# Patient Record
Sex: Male | Born: 1935 | Race: Black or African American | Hispanic: No | Smoking: Never smoker
Health system: Southern US, Community
[De-identification: ages and names within clinical notes are randomized; demographics above are authoritative.]

## PROBLEM LIST (undated history)

## (undated) DIAGNOSIS — N1330 Unspecified hydronephrosis: Secondary | ICD-10-CM

## (undated) DIAGNOSIS — C61 Malignant neoplasm of prostate: Secondary | ICD-10-CM

## (undated) DIAGNOSIS — M1990 Unspecified osteoarthritis, unspecified site: Secondary | ICD-10-CM

## (undated) DIAGNOSIS — N289 Disorder of kidney and ureter, unspecified: Secondary | ICD-10-CM

## (undated) DIAGNOSIS — E119 Type 2 diabetes mellitus without complications: Secondary | ICD-10-CM

## (undated) DIAGNOSIS — E785 Hyperlipidemia, unspecified: Secondary | ICD-10-CM

## (undated) DIAGNOSIS — Z9189 Other specified personal risk factors, not elsewhere classified: Secondary | ICD-10-CM

## (undated) DIAGNOSIS — I10 Essential (primary) hypertension: Secondary | ICD-10-CM

## (undated) DIAGNOSIS — IMO0002 Reserved for concepts with insufficient information to code with codable children: Secondary | ICD-10-CM

## (undated) DIAGNOSIS — IMO0001 Reserved for inherently not codable concepts without codable children: Secondary | ICD-10-CM

## (undated) DIAGNOSIS — N281 Cyst of kidney, acquired: Secondary | ICD-10-CM

## (undated) HISTORY — DX: Reserved for concepts with insufficient information to code with codable children: IMO0002

## (undated) HISTORY — PX: CATARACT EXTRACTION W/ INTRAOCULAR LENS IMPLANT: SHX1309

## (undated) HISTORY — PX: TONSILECTOMY/ADENOIDECTOMY WITH MYRINGOTOMY: SHX6125

## (undated) HISTORY — DX: Reserved for inherently not codable concepts without codable children: IMO0001

## (undated) HISTORY — PX: TONSILLECTOMY: SUR1361

---

## 1998-02-16 HISTORY — PX: INGUINAL HERNIA REPAIR: SUR1180

## 1999-02-11 ENCOUNTER — Inpatient Hospital Stay (HOSPITAL_COMMUNITY): Admission: EM | Admit: 1999-02-11 | Discharge: 1999-02-14 | Payer: Self-pay | Admitting: Emergency Medicine

## 1999-02-11 ENCOUNTER — Encounter: Payer: Self-pay | Admitting: Internal Medicine

## 1999-05-01 ENCOUNTER — Encounter: Admission: RE | Admit: 1999-05-01 | Discharge: 1999-05-01 | Payer: Self-pay | Admitting: *Deleted

## 1999-05-01 ENCOUNTER — Encounter: Payer: Self-pay | Admitting: *Deleted

## 1999-05-01 ENCOUNTER — Ambulatory Visit (HOSPITAL_COMMUNITY): Admission: RE | Admit: 1999-05-01 | Discharge: 1999-05-01 | Payer: Self-pay | Admitting: *Deleted

## 1999-07-23 ENCOUNTER — Ambulatory Visit (HOSPITAL_COMMUNITY): Admission: RE | Admit: 1999-07-23 | Discharge: 1999-07-23 | Payer: Self-pay | Admitting: *Deleted

## 2000-05-28 ENCOUNTER — Ambulatory Visit (HOSPITAL_COMMUNITY): Admission: RE | Admit: 2000-05-28 | Discharge: 2000-05-28 | Payer: Self-pay | Admitting: *Deleted

## 2003-12-19 ENCOUNTER — Inpatient Hospital Stay (HOSPITAL_COMMUNITY): Admission: EM | Admit: 2003-12-19 | Discharge: 2003-12-24 | Payer: Self-pay | Admitting: Emergency Medicine

## 2004-11-13 ENCOUNTER — Ambulatory Visit (HOSPITAL_COMMUNITY): Admission: RE | Admit: 2004-11-13 | Discharge: 2004-11-13 | Payer: Self-pay | Admitting: General Surgery

## 2011-05-13 ENCOUNTER — Inpatient Hospital Stay (HOSPITAL_COMMUNITY)
Admission: EM | Admit: 2011-05-13 | Discharge: 2011-05-15 | DRG: 864 | Disposition: A | Discharge status: Home / Self Care | Payer: Medicare Other | Attending: Internal Medicine | Admitting: Internal Medicine

## 2011-05-13 DIAGNOSIS — R4182 Altered mental status, unspecified: Secondary | ICD-10-CM | POA: Diagnosis present

## 2011-05-13 DIAGNOSIS — I10 Essential (primary) hypertension: Secondary | ICD-10-CM | POA: Diagnosis present

## 2011-05-13 DIAGNOSIS — E119 Type 2 diabetes mellitus without complications: Secondary | ICD-10-CM | POA: Diagnosis present

## 2011-05-13 DIAGNOSIS — E781 Pure hyperglyceridemia: Secondary | ICD-10-CM | POA: Diagnosis present

## 2011-05-13 DIAGNOSIS — R509 Fever, unspecified: Principal | ICD-10-CM | POA: Diagnosis present

## 2011-05-13 HISTORY — DX: Essential (primary) hypertension: I10

## 2011-05-14 ENCOUNTER — Encounter (HOSPITAL_COMMUNITY): Payer: Self-pay | Admitting: *Deleted

## 2011-05-14 ENCOUNTER — Other Ambulatory Visit: Payer: Self-pay

## 2011-05-14 ENCOUNTER — Emergency Department (HOSPITAL_COMMUNITY): Payer: Medicare Other

## 2011-05-14 LAB — CBC
HCT: 47 % (ref 39.0–52.0)
HCT: 43.3 % (ref 39.0–52.0)
Hemoglobin: 15.6 g/dL (ref 13.0–17.0)
MCH: 30.4 pg (ref 26.0–34.0)
MCHC: 33.2 g/dL (ref 30.0–36.0)
MCHC: 33.9 g/dL (ref 30.0–36.0)
MCV: 89.5 fL (ref 78.0–100.0)
Platelets: 131 10*3/uL — ABNORMAL LOW (ref 150–400)
Platelets: 132 10*3/uL — ABNORMAL LOW (ref 150–400)
RBC: 5.26 MIL/uL (ref 4.22–5.81)
RDW: 13.1 % (ref 11.5–15.5)
RDW: 13.2 % (ref 11.5–15.5)
WBC: 6.1 10*3/uL (ref 4.0–10.5)

## 2011-05-14 LAB — COMPREHENSIVE METABOLIC PANEL
ALT: 9 U/L (ref 0–53)
AST: 15 U/L (ref 0–37)
Albumin: 3.9 g/dL (ref 3.5–5.2)
Alkaline Phosphatase: 58 U/L (ref 39–117)
Calcium: 9.2 mg/dL (ref 8.4–10.5)
Chloride: 98 mEq/L (ref 96–112)
GFR calc Af Amer: 64 mL/min — ABNORMAL LOW (ref >90)
GFR calc non Af Amer: 55 mL/min — ABNORMAL LOW (ref >90)
Glucose, Bld: 146 mg/dL — ABNORMAL HIGH (ref 70–99)
Potassium: 3.6 mEq/L (ref 3.5–5.1)
Sodium: 134 mEq/L — ABNORMAL LOW (ref 135–145)
Total Bilirubin: 0.5 mg/dL (ref 0.3–1.2)
Total Protein: 7.8 g/dL (ref 6.0–8.3)

## 2011-05-14 LAB — LACTIC ACID, PLASMA: Lactic Acid, Venous: 1.3 mmol/L (ref 0.5–2.2)

## 2011-05-14 LAB — POCT I-STAT, CHEM 8
BUN: 17 mg/dL (ref 6–23)
Calcium, Ion: 1.1 mmol/L — ABNORMAL LOW (ref 1.12–1.32)
Chloride: 102 mEq/L (ref 96–112)
Creatinine, Ser: 1.4 mg/dL — ABNORMAL HIGH (ref 0.50–1.35)
Glucose, Bld: 150 mg/dL — ABNORMAL HIGH (ref 70–99)
Potassium: 3.9 mEq/L (ref 3.5–5.1)
Sodium: 139 mEq/L (ref 135–145)
TCO2: 26 mmol/L (ref 0–100)

## 2011-05-14 LAB — TSH: TSH: 2.653 u[IU]/mL (ref 0.350–4.500)

## 2011-05-14 LAB — GLUCOSE, CAPILLARY
Glucose-Capillary: 125 mg/dL — ABNORMAL HIGH (ref 70–99)
Glucose-Capillary: 115 mg/dL — ABNORMAL HIGH (ref 70–99)

## 2011-05-14 LAB — URINALYSIS, ROUTINE W REFLEX MICROSCOPIC
Bilirubin Urine: NEGATIVE
Glucose, UA: NEGATIVE mg/dL
Ketones, ur: NEGATIVE mg/dL
Nitrite: NEGATIVE
Urobilinogen, UA: 0.2 mg/dL (ref 0.0–1.0)
pH: 6 (ref 5.0–8.0)

## 2011-05-14 LAB — URINE MICROSCOPIC-ADD ON

## 2011-05-14 LAB — PROCALCITONIN: Procalcitonin: <0.1 ng/mL

## 2011-05-14 LAB — POCT I-STAT TROPONIN I

## 2011-05-14 MED ORDER — ACETAMINOPHEN 325 MG PO TABS
650.0000 mg | ORAL_TABLET | Freq: Four times a day (QID) | ORAL | Status: DC | PRN
  Administered 2011-05-14: 650 mg via ORAL
  Filled 2011-05-14: qty 2

## 2011-05-14 MED ORDER — ACETAMINOPHEN 325 MG PO TABS
650.0000 mg | ORAL_TABLET | Freq: Once | ORAL | Status: AC
  Administered 2011-05-14: 650 mg via ORAL
  Filled 2011-05-14: qty 2

## 2011-05-14 MED ORDER — DEXTROSE 5 % IV SOLN
500.0000 mg | Freq: Every day | INTRAVENOUS | Status: DC
  Administered 2011-05-14: 500 mg via INTRAVENOUS
  Filled 2011-05-14 (×2): qty 500

## 2011-05-14 MED ORDER — INSULIN ASPART 100 UNIT/ML ~~LOC~~ SOLN
0.0000 [IU] | Freq: Three times a day (TID) | SUBCUTANEOUS | Status: DC
  Administered 2011-05-14 – 2011-05-15 (×2): 1 [IU] via SUBCUTANEOUS

## 2011-05-14 MED ORDER — METOPROLOL TARTRATE 12.5 MG HALF TABLET
12.5000 mg | ORAL_TABLET | Freq: Two times a day (BID) | ORAL | Status: DC
  Administered 2011-05-14 (×2): 12.5 mg via ORAL
  Filled 2011-05-14 (×5): qty 1

## 2011-05-14 MED ORDER — AZITHROMYCIN 500 MG IV SOLR
500.0000 mg | Freq: Once | INTRAVENOUS | Status: AC
  Administered 2011-05-14: 500 mg via INTRAVENOUS
  Filled 2011-05-14: qty 500

## 2011-05-14 MED ORDER — DEXTROSE 5 % IV SOLN
1.0000 g | Freq: Every day | INTRAVENOUS | Status: DC
  Administered 2011-05-14: 1 g via INTRAVENOUS
  Filled 2011-05-14 (×2): qty 10

## 2011-05-14 MED ORDER — ZOLPIDEM TARTRATE 5 MG PO TABS: 5.0000 mg | ORAL_TABLET | Freq: Every evening | ORAL | Status: DC | PRN

## 2011-05-14 MED ORDER — METFORMIN HCL 500 MG PO TABS
500.0000 mg | ORAL_TABLET | Freq: Two times a day (BID) | ORAL | Status: DC
  Administered 2011-05-14 – 2011-05-15 (×3): 500 mg via ORAL
  Filled 2011-05-14 (×5): qty 1

## 2011-05-14 MED ORDER — ASPIRIN EC 81 MG PO TBEC
81.0000 mg | DELAYED_RELEASE_TABLET | Freq: Every day | ORAL | Status: DC
  Administered 2011-05-14: 81 mg via ORAL
  Filled 2011-05-14 (×2): qty 1

## 2011-05-14 MED ORDER — ENOXAPARIN SODIUM 40 MG/0.4ML ~~LOC~~ SOLN
40.0000 mg | Freq: Every day | SUBCUTANEOUS | Status: DC
  Administered 2011-05-14: 40 mg via SUBCUTANEOUS
  Filled 2011-05-14 (×2): qty 0.4

## 2011-05-14 MED ORDER — ACETAMINOPHEN 650 MG RE SUPP: 650.0000 mg | Freq: Four times a day (QID) | RECTAL | Status: DC | PRN

## 2011-05-14 MED ORDER — ONDANSETRON HCL 4 MG PO TABS: 4.0000 mg | ORAL_TABLET | Freq: Four times a day (QID) | ORAL | Status: DC | PRN

## 2011-05-14 MED ORDER — POTASSIUM CHLORIDE IN NACL 20-0.9 MEQ/L-% IV SOLN
INTRAVENOUS | Status: DC
  Administered 2011-05-14 – 2011-05-15 (×2): via INTRAVENOUS
  Filled 2011-05-14 (×3): qty 1000

## 2011-05-14 MED ORDER — SENNOSIDES-DOCUSATE SODIUM 8.6-50 MG PO TABS: 1.0000 | ORAL_TABLET | Freq: Every evening | ORAL | Status: DC | PRN

## 2011-05-14 MED ORDER — DEXTROSE 5 % IV SOLN
1.0000 g | Freq: Once | INTRAVENOUS | Status: AC
  Administered 2011-05-14: 1 g via INTRAVENOUS
  Filled 2011-05-14: qty 10

## 2011-05-14 MED ORDER — ONDANSETRON HCL 4 MG/2ML IJ SOLN: 4.0000 mg | Freq: Four times a day (QID) | INTRAMUSCULAR | Status: DC | PRN

## 2011-05-14 MED ORDER — SODIUM CHLORIDE 0.9 % IV BOLUS (SEPSIS)
1000.0000 mL | Freq: Once | INTRAVENOUS | Status: AC
  Administered 2011-05-14: 1000 mL via INTRAVENOUS

## 2011-05-14 MED ORDER — SODIUM CHLORIDE 0.9 % IV SOLN
INTRAVENOUS | Status: DC
  Administered 2011-05-14: 01:00:00 via INTRAVENOUS

## 2011-05-14 MED ORDER — ALUM & MAG HYDROXIDE-SIMETH 200-200-20 MG/5ML PO SUSP: 30.0000 mL | Freq: Four times a day (QID) | ORAL | Status: DC | PRN

## 2011-05-14 MED ORDER — GLIPIZIDE 5 MG PO TABS
5.0000 mg | ORAL_TABLET | Freq: Two times a day (BID) | ORAL | Status: DC
  Administered 2011-05-14 – 2011-05-15 (×3): 5 mg via ORAL
  Filled 2011-05-14 (×5): qty 1

## 2011-05-14 NOTE — ED Notes (Addendum)
Here with son, son reports father alterred for 2d, fever present in triage, son report: pt has had, alterred MS, confusion, sob, unsteady on feet, cough, leg swelling "more than usual". Pt alert/lethargic & interactive. Daughter also present, reports: "he denies and minimizes sx, has been delusional and confused".

## 2011-05-14 NOTE — ED Notes (Signed)
First meeting patient. Patient resting with NAD at this time. Family at bedside.

## 2011-05-14 NOTE — H&P (Signed)
Tony Vasquez is an 78 y.o. male.   Chief Complaint: AMS HPI: Patient is a 78 year old AA male with a benign medical history of DM2 well controlled and HTN.  Last seen for yearly APE in Jan 2013.  Brought in to the ER with changes in AMS/confusion and a temp.  Notes a cough but otherwise has no other complaints.  ER w/u suggests possible early PNA, I was asked to admit.  He is oriented to person and place, but not fully to circumstance although he does recognize me.  Doing a bit better as his fever is breaking according to his daughter.  Past Medical History  Diagnosis Date  . Diabetes mellitus   . Hypertension     Past Surgical History  Procedure Date  . Hernia repair     Review of Systems  General:       Denies fevers, chills, sweats.   Eyes:       Denies blurring, diplopia.   Ears/Nose/Throat:       Denies earache, ear discharge, nosebleeds, sore throat.   Cardiovascular:       Denies chest pains, palpitations.   Respiratory:       Denies cough, dyspnea.   Gastrointestinal:       Denies nausea, vomiting, diarrhea, constipation.   Genitourinary:       Denies dysuria, urinary hesitancy.   Musculoskeletal:       Denies back pain, joint pain.   Skin:       Denies rash, itching.   Neurologic:       Denies weakness.   Psychiatric:       Denies depression, anxiety. Slightly confused Endocrine:       Denies cold intolerance, heat intolerance, polydipsia, polyphagia, polyuria, weight change.   Heme/Lymphatic:       Denies abnormal bruising, bleeding.   Allergic/Immunologic:       Denies urticaria, hay fever.    Past History Past Medical History (reviewed - no changes required): Diabetes Mellitus Type 2, 1998 HTN Hypertriglyceridemia Surgical History (reviewed - no changes required): L hernia inguinal L Cataracts Teeth pulled (complete) 2012 Family History (reviewed - no changes required): Father d 74 Pneumonia, DM2, Obesity Mother d 10's 3 sistersA, 1d 1  brother  Social History (reviewed - no changes required): Widowed 2011 to [place], 4 children HS education Retired Presenter, broadcasting   Physical Exam  General appearance: well nourished, well hydrated, no acute distress  Eyes  Ophthalmoscopic: discs sharp and flat, no a/v nicking, hemorrhages, or exudates  Ears, Nose and Throat  External ears: normal, no lesions or deformities External nose: normal, no lesions or deformities Otoscopic: canals clear, tympanic membranes intact, no fluid Hearing: grossly intact Nasal: mucosa, septum, and turbinates normal Dental: good dentition Pharynx: tongue normal, protrudes mid line,  posterior pharynx without erythema or exudate  Neck  Neck: supple, no masses, trachea midline Thyroid: no nodules, masses, tenderness, or enlargement  Respiratory  Respiratory effort: no intercostal retractions or use of accessory muscles Auscultation: no rales, rhonchi, or wheezes  Cardiovascular  Palpation: no thrill or palpable murmurs, no displacement of PMI Auscultation: S1, S2, no murmur, rub, or gallop Periph. circulation: no cyanosis, clubbing, edema  Gastrointestinal  Abdomen: soft, non-tender, no masses, bowel sounds normal Liver and spleen: no enlargement or nodularity  Genitourinary  Prostate: no enlargement or nodularity  Lymphatic  Neck: no cervical adenopathy Misc. lymph nodes: no other adenopathy  Musculoskeletal  Gait and station: normal Digits and nails: no  clubbing, cyanosis, petechiae, or nodes Head and neck: normal alignment and mobility Spine, ribs, pelvis: normal alignment and mobility, no deformity RUE: normal ROM and strength, no joint enlargement or tenderness LUE: normal ROM and strength, no joint enlargement or tenderness RLE: normal ROM and strength, no joint enlargement or tenderness LLE: normal ROM and strength, no joint enlargement or tenderness Right Knee: Normal Knee Exam with out Effusion Left Knee: Normal Knee Exam with  out Effusion  Skin  Inspection: no rashes, lesions  Neurologic  Cranial nerves: II - XII grossly intact Reflexes: 2+, symmetric Sensation: intact to touch, vibration Speech:  Normal  Mental Status Exam  Judgment, insight: intact Orientation: oriented to time, place, and person, a bit confused on circumstance Memory: intact Mood and affect: Normal Mood Mentation: Alert & Oriented Hallucinations/Delusion: No evidence   Medications Prior to Admission  Medication Dose Route Frequency Provider Last Rate Last Dose  . 0.9 %  sodium chloride infusion   Intravenous Continuous Sunnie Nielsen, MD 125 mL/hr at 05/14/11 0123    . acetaminophen (TYLENOL) tablet 650 mg  650 mg Oral Once Sunnie Nielsen, MD   650 mg at 05/14/11 0029  . azithromycin (ZITHROMAX) 500 mg in dextrose 5 % 250 mL IVPB  500 mg Intravenous Once Sunnie Nielsen, MD   500 mg at 05/14/11 0151  . cefTRIAXone (ROCEPHIN) 1 g in dextrose 5 % 50 mL IVPB  1 g Intravenous Once Sunnie Nielsen, MD   1 g at 05/14/11 0124  . sodium chloride 0.9 % bolus 1,000 mL  1,000 mL Intravenous Once Sunnie Nielsen, MD   1,000 mL at 05/14/11 0029   No current outpatient prescriptions on file as of 05/13/2011.    Results for orders placed during the hospital encounter of 05/13/11 (from the past 48 hour(s))  CBC     Status: Abnormal   Collection Time   05/14/11 12:19 AM      Component Value Range Comment   WBC 6.1  4.0 - 10.5 (K/uL)    RBC 5.26  4.22 - 5.81 (MIL/uL)    Hemoglobin 15.6  13.0 - 17.0 (g/dL)    HCT 16.1  09.6 - 04.5 (%)    MCV 89.4  78.0 - 100.0 (fL)    MCH 29.7  26.0 - 34.0 (pg)    MCHC 33.2  30.0 - 36.0 (g/dL)    RDW 40.9  81.1 - 91.4 (%)    Platelets 131 (*) 150 - 400 (K/uL)   COMPREHENSIVE METABOLIC PANEL     Status: Abnormal   Collection Time   05/14/11 12:19 AM      Component Value Range Comment   Sodium 134 (*) 135 - 145 (mEq/L)    Potassium 3.6  3.5 - 5.1 (mEq/L)    Chloride 98  96 - 112 (mEq/L)    CO2 25  19 - 32 (mEq/L)     Glucose, Bld 146 (*) 70 - 99 (mg/dL)    BUN 14  6 - 23 (mg/dL)    Creatinine, Ser 7.82  0.50 - 1.35 (mg/dL)    Calcium 9.2  8.4 - 10.5 (mg/dL)    Total Protein 7.8  6.0 - 8.3 (g/dL)    Albumin 3.9  3.5 - 5.2 (g/dL)    AST 15  0 - 37 (U/L)    ALT 9  0 - 53 (U/L)    Alkaline Phosphatase 58  39 - 117 (U/L)    Total Bilirubin 0.5  0.3 - 1.2 (mg/dL)  GFR calc non Af Amer 55 (*) >90 (mL/min)    GFR calc Af Amer 64 (*) >90 (mL/min)   PROCALCITONIN     Status: Normal   Collection Time   05/14/11 12:19 AM      Component Value Range Comment   Procalcitonin <0.10     LACTIC ACID, PLASMA     Status: Normal   Collection Time   05/14/11 12:41 AM      Component Value Range Comment   Lactic Acid, Venous 1.3  0.5 - 2.2 (mmol/L)   POCT I-STAT, CHEM 8     Status: Abnormal   Collection Time   05/14/11 12:43 AM      Component Value Range Comment   Sodium 139  135 - 145 (mEq/L)    Potassium 3.9  3.5 - 5.1 (mEq/L)    Chloride 102  96 - 112 (mEq/L)    BUN 17  6 - 23 (mg/dL)    Creatinine, Ser 1.19 (*) 0.50 - 1.35 (mg/dL)    Glucose, Bld 147 (*) 70 - 99 (mg/dL)    Calcium, Ion 8.29 (*) 1.12 - 1.32 (mmol/L)    TCO2 26  0 - 100 (mmol/L)    Hemoglobin 17.0  13.0 - 17.0 (g/dL)    HCT 56.2  13.0 - 86.5 (%)   URINALYSIS, ROUTINE W REFLEX MICROSCOPIC     Status: Abnormal   Collection Time   05/14/11 12:53 AM      Component Value Range Comment   Color, Urine YELLOW  YELLOW     APPearance CLEAR  CLEAR     Specific Gravity, Urine 1.020  1.005 - 1.030     pH 6.0  5.0 - 8.0     Glucose, UA NEGATIVE  NEGATIVE (mg/dL)    Hgb urine dipstick TRACE (*) NEGATIVE     Bilirubin Urine NEGATIVE  NEGATIVE     Ketones, ur NEGATIVE  NEGATIVE (mg/dL)    Protein, ur NEGATIVE  NEGATIVE (mg/dL)    Urobilinogen, UA 0.2  0.0 - 1.0 (mg/dL)    Nitrite NEGATIVE  NEGATIVE     Leukocytes, UA NEGATIVE  NEGATIVE    URINE MICROSCOPIC-ADD ON     Status: Normal   Collection Time   05/14/11 12:53 AM      Component Value Range  Comment   Squamous Epithelial / LPF RARE  RARE     WBC, UA 0-2  <3 (WBC/hpf)    RBC / HPF 3-6  <3 (RBC/hpf)    Bacteria, UA RARE  RARE    POCT I-STAT TROPONIN I     Status: Normal   Collection Time   05/14/11 12:56 AM      Component Value Range Comment   Troponin i, poc 0.00  0.00 - 0.08 (ng/mL)    Comment 3             Ct Head Wo Contrast  05/14/2011  *RADIOLOGY REPORT*  Clinical Data: Altered mental status.  Headache.  CT HEAD WITHOUT CONTRAST  Technique:  Contiguous axial images were obtained from the base of the skull through the vertex without contrast.  Comparison: None.  Findings: There is no evidence of acute infarction, mass lesion, or intra- or extra-axial hemorrhage on CT.  Prominence of the ventricles and sulci reflects mild cortical volume loss.  Diffuse periventricular and subcortical white matter change likely reflects small vessel ischemic microangiopathy.  Mild cerebellar atrophy is noted.  The brainstem and fourth ventricle are within normal limits.  The  basal ganglia are unremarkable in appearance.  The cerebral hemispheres demonstrate grossly normal gray-white differentiation. No mass effect or midline shift is seen.  There is no evidence of fracture; visualized osseous structures are unremarkable in appearance.  The visualized portions of the orbits are within normal limits.  The paranasal sinuses and mastoid air cells are well-aerated.  No significant soft tissue abnormalities are seen.  IMPRESSION:  1.  No acute intracranial pathology seen on CT. 2.  Mild cortical volume loss and diffuse small vessel ischemic microangiopathy.  Original Report Authenticated By: Tonia Ghent, M.D.   Dg Chest Portable 1 View  05/14/2011  *RADIOLOGY REPORT*  Clinical Data: Shortness of breath and fever.  PORTABLE CHEST - 1 VIEW  Comparison: 12/23/2003  Findings: The cardiomediastinal silhouette is unremarkable. There is no evidence of focal airspace disease, pulmonary edema, suspicious pulmonary  nodule/mass, pleural effusion, or pneumothorax. No acute bony abnormalities are identified.  IMPRESSION: No evidence of active cardiopulmonary disease.  Original Report Authenticated By: Rosendo Gros, M.D.    ROS  Blood pressure 155/78, pulse 100, temperature 100 F (37.8 C), temperature source Oral, resp. rate 16, SpO2 99.00%. Physical Exam   Assessment/Plan AMS  Likely related to PNA, Rocephin/Azithromycin in ER as has fever.  WIll repeat CXR tomorrow AM.  Other labs an workup negative.  BC x 2 ordered. TSH and B12 levels ordered although with fever, likely related to illness DM2  Well controlled HTN  Continue Metoprolol  Aleem Elza W 05/14/2011, 2:31 AM

## 2011-05-14 NOTE — ED Provider Notes (Addendum)
History     CSN: [id number]  Arrival date & time 05/13/11  2244   First MD Initiated Contact with Patient 05/14/11 0006      Chief Complaint  Patient presents with  . Shortness of Breath  . Fever  . Altered Mental Status    (Consider location/radiation/quality/duration/timing/severity/associated sxs/prior treatment) Patient is a 78 y.o. male presenting with shortness of breath, fever, and altered mental status. The history is provided by the patient and a relative.  Shortness of Breath  The current episode started yesterday. The onset was gradual. The problem occurs continuously. The problem has been gradually worsening. The problem is moderate. The symptoms are relieved by nothing. The symptoms are aggravated by nothing. Associated symptoms include a fever and shortness of breath. Pertinent negatives include no chest pain. There were no sick contacts.  Fever Primary symptoms of the febrile illness include fever, shortness of breath and altered mental status. Primary symptoms do not include headaches, abdominal pain, dysuria or rash.  Altered Mental Status Associated symptoms include shortness of breath. Pertinent negatives include no chest pain, no abdominal pain and no headaches.   family noticed confusion and not acting himself tonight. Patient denies any significant complaints. No chest pain. No abdominal pain. No nausea vomiting or diarrhea. No rashes. no recent travel. Patient able to state his name and tell me where he is.   Past Medical History  Diagnosis Date  . Diabetes mellitus   . Hypertension     Past Surgical History  Procedure Date  . Hernia repair     No family history on file.  History  Substance Use Topics  . Smoking status: Never Smoker   . Smokeless tobacco: Not on file  . Alcohol Use: No      Review of Systems  Constitutional: Positive for fever. Negative for chills.  HENT: Negative for neck pain and neck stiffness.   Eyes: Negative for pain.    Respiratory: Positive for shortness of breath.   Cardiovascular: Negative for chest pain.  Gastrointestinal: Negative for abdominal pain.  Genitourinary: Negative for dysuria.  Musculoskeletal: Negative for back pain.  Skin: Negative for rash.  Neurological: Negative for dizziness and headaches.  Psychiatric/Behavioral: Positive for confusion and altered mental status.  All other systems reviewed and are negative.    Allergies  Review of patient's allergies indicates no known allergies.  Home Medications   Current Outpatient Rx  Name Route Sig Dispense Refill  . GLIPIZIDE 5 MG PO TABS Oral Take 5 mg by mouth 2 (two) times daily before a meal.    . METFORMIN HCL 500 MG PO TABS Oral Take 500 mg by mouth 2 (two) times daily with a meal.      BP 155/78  Pulse 100  Temp(Src) 100 F (37.8 C) (Oral)  Resp 16  SpO2 99%  Physical Exam  Constitutional: He appears well-developed and well-nourished.  HENT:  Head: Normocephalic and atraumatic.       Dry mucous membranes  Eyes: EOM are normal. Pupils are equal, round, and reactive to light.  Neck: Trachea normal. Neck supple. No thyromegaly present.  Cardiovascular: Normal rate, regular rhythm, S1 normal, S2 normal and normal pulses.     No systolic murmur is present   No diastolic murmur is present  Pulses:      Radial pulses are 2+ on the right side, and 2+ on the left side.  Pulmonary/Chest: Effort normal and breath sounds normal. He has no wheezes. He has no rhonchi. He  has no rales. He exhibits no tenderness.  Abdominal: Soft. Normal appearance and bowel sounds are normal. There is no tenderness. There is no CVA tenderness and negative Murphy's sign.  Musculoskeletal:       BLE:s Calves nontender, no cords or erythema, negative Homans sign  Neurological: He is alert. He has normal strength. No cranial nerve deficit or sensory deficit. GCS eye subscore is 4. GCS verbal subscore is 5. GCS motor subscore is 6.       Alert and  oriented x2. No unilateral deficits  Skin: Skin is warm and dry. No rash noted. He is not diaphoretic.  Psychiatric: His speech is normal.       Cooperative and appropriate    ED Course  Procedures (including critical care time)  Labs Reviewed  CBC - Abnormal; Notable for the following:    Platelets 131 (*)    All other components within normal limits  COMPREHENSIVE METABOLIC PANEL - Abnormal; Notable for the following:    Sodium 134 (*)    Glucose, Bld 146 (*)    GFR calc non Af Amer 55 (*)    GFR calc Af Amer 64 (*)    All other components within normal limits  URINALYSIS, ROUTINE W REFLEX MICROSCOPIC - Abnormal; Notable for the following:    Hgb urine dipstick TRACE (*)    All other components within normal limits  POCT I-STAT, CHEM 8 - Abnormal; Notable for the following:    Creatinine, Ser 1.40 (*)    Glucose, Bld 150 (*)    Calcium, Ion 1.10 (*)    All other components within normal limits  LACTIC ACID, PLASMA  PROCALCITONIN  POCT I-STAT TROPONIN I  URINE MICROSCOPIC-ADD ON   Dg Chest Portable 1 View  05/14/2011  *RADIOLOGY REPORT*  Clinical Data: Shortness of breath and fever.  PORTABLE CHEST - 1 VIEW  Comparison: 12/23/2003  Findings: The cardiomediastinal silhouette is unremarkable. There is no evidence of focal airspace disease, pulmonary edema, suspicious pulmonary nodule/mass, pleural effusion, or pneumothorax. No acute bony abnormalities are identified.  IMPRESSION: No evidence of active cardiopulmonary disease.  Original Report Authenticated By: Rosendo Gros, M.D.    Date: 05/14/2011  Rate: 100  Rhythm: normal sinus rhythm  QRS Axis: normal  Intervals: normal  ST/T Wave abnormalities: nonspecific ST/T changes  Conduction Disutrbances:none  Narrative Interpretation:   Old EKG Reviewed: unchanged     2:13 AM case d/w Dr Wylene Simmer as above. He will admit and see PT in am.    MDM   Fever, altered mental status with some respiratory complaints respiratory  complaints, otherwise no infectious symptoms. Tylenol and IV fluids provided. Blood pressure in normal range and no clinical sepsis. Family bedside very concerned with confusion and altered mental status. IV antibiotics initiated for possible early pneumonia. No risk factors for healthcare associated pneumonia. Plan admit as above.        Sunnie Nielsen, MD 05/14/11 6962  Sunnie Nielsen, MD 05/14/11 [phone]

## 2011-05-14 NOTE — ED Notes (Signed)
Report given to Duwayne Heck, RN on 3000. Patient placed on zoll and being transported to 3013.

## 2011-05-15 ENCOUNTER — Inpatient Hospital Stay (HOSPITAL_COMMUNITY): Payer: Medicare Other

## 2011-05-15 LAB — BASIC METABOLIC PANEL
CO2: 26 mEq/L (ref 19–32)
Calcium: 8.5 mg/dL (ref 8.4–10.5)
GFR calc non Af Amer: 56 mL/min — ABNORMAL LOW (ref >90)
Glucose, Bld: 69 mg/dL — ABNORMAL LOW (ref 70–99)
Potassium: 3.7 mEq/L (ref 3.5–5.1)
Sodium: 136 mEq/L (ref 135–145)

## 2011-05-15 LAB — GLUCOSE, CAPILLARY: Glucose-Capillary: 126 mg/dL — ABNORMAL HIGH (ref 70–99)

## 2011-05-15 LAB — CBC
Hemoglobin: 15 g/dL (ref 13.0–17.0)
MCH: 30.2 pg (ref 26.0–34.0)
MCHC: 34.6 g/dL (ref 30.0–36.0)
Platelets: 135 10*3/uL — ABNORMAL LOW (ref 150–400)
RBC: 4.97 MIL/uL (ref 4.22–5.81)

## 2011-05-15 MED ORDER — METOPROLOL TARTRATE 12.5 MG HALF TABLET: 12.5000 mg | ORAL_TABLET | Freq: Two times a day (BID) | ORAL | Status: DC

## 2011-05-15 MED ORDER — CEFUROXIME AXETIL 500 MG PO TABS: 500.0000 mg | ORAL_TABLET | Freq: Two times a day (BID) | ORAL | Status: AC

## 2011-05-15 NOTE — Progress Notes (Signed)
Patient discharge instructions given to patient and family. NO further questions asked. Pt D/C home with no signs of acute distress.

## 2011-05-15 NOTE — Discharge Summary (Signed)
Physician Discharge Summary  Patient ID: BURWELL BETHEL MRN: [id number] DOB/AGE: August 07, 1932 78 y.o.  Admit date: 05/13/2011 Discharge date: 05/15/2011  Admission Diagnoses:  Discharge Diagnoses:  Active Problems:  * No active hospital problems. *    Discharged Condition: stable  Hospital Course: Mr. Leffel was admitted with fever and AMS.  Had negative CXR, BCx2 and U/A   Once fever resolved, back to normal mental status.  Daughter at bedside.  No fevers since day of admission.  Unremarkable course.  Ready to go home today.  Consults: None  Significant Diagnostic Studies: labs: Normal CBC, CXR, U/A, BC x 2 negative Treatments: IV hydration and antibiotics: ceftriaxone and azithromycin  Discharge Exam: Blood pressure 176/88, pulse 77, temperature 99.2 F (37.3 C), temperature source Oral, resp. rate 18, height 5\' 5"  (1.651 m), weight 73.7 kg (162 lb 7.7 oz), SpO2 98.00%. General appearance: alert, cooperative and appears stated age Throat: lips, mucosa, and tongue normal; teeth and gums normal Resp: clear to auscultation bilaterally Cardio: regular rate and rhythm, S1, S2 normal, no murmur, click, rub or gallop GI: soft, non-tender; bowel sounds normal; no masses,  no organomegaly Extremities: extremities normal, atraumatic, no cyanosis or edema Lymph nodes: Cervical, supraclavicular, and axillary nodes normal. Neurologic: Alert and oriented X 3, normal strength and tone. Normal symmetric reflexes. Normal coordination and gait  Disposition:    Medication List  As of 05/15/2011  9:02 AM   TAKE these medications         cefUROXime 500 MG tablet   Commonly known as: CEFTIN   Take 1 tablet (500 mg total) by mouth 2 (two) times daily.      glipiZIDE 5 MG tablet   Commonly known as: GLUCOTROL   Take 5 mg by mouth 2 (two) times daily before a meal.      metFORMIN 500 MG tablet   Commonly known as: GLUCOPHAGE   Take 500 mg by mouth 2 (two) times daily with a meal.     metoprolol tartrate 12.5 mg Tabs   Commonly known as: LOPRESSOR   Take 0.5 tablets (12.5 mg total) by mouth 2 (two) times daily.           Follow-up Information    Follow up with Gaspar Garbe, MD. Fransisco Beau will call to check on him on Monday)          Signed: Alaila Pillard W 05/15/2011, 9:02 AM

## 2011-05-15 NOTE — Progress Notes (Signed)
05-15-11 UR completed. Ronny Flurry RN BSN

## 2011-05-20 LAB — CULTURE, BLOOD (ROUTINE X 2)
Culture  Setup Time: 201303281033
Culture  Setup Time: 201303281033
Culture: NO GROWTH

## 2012-10-24 ENCOUNTER — Emergency Department (HOSPITAL_COMMUNITY): Payer: Medicare Other

## 2012-10-24 ENCOUNTER — Inpatient Hospital Stay (HOSPITAL_COMMUNITY)
Admission: EM | Admit: 2012-10-24 | Discharge: 2012-10-26 | DRG: 203 | Disposition: A | Discharge status: Home Health | Payer: Medicare Other | Attending: Internal Medicine | Admitting: Internal Medicine

## 2012-10-24 ENCOUNTER — Encounter (HOSPITAL_COMMUNITY): Payer: Self-pay | Admitting: Emergency Medicine

## 2012-10-24 ENCOUNTER — Inpatient Hospital Stay (HOSPITAL_COMMUNITY): Payer: Medicare Other

## 2012-10-24 DIAGNOSIS — J40 Bronchitis, not specified as acute or chronic: Principal | ICD-10-CM | POA: Diagnosis present

## 2012-10-24 DIAGNOSIS — E860 Dehydration: Secondary | ICD-10-CM | POA: Diagnosis present

## 2012-10-24 DIAGNOSIS — Z79899 Other long term (current) drug therapy: Secondary | ICD-10-CM

## 2012-10-24 DIAGNOSIS — M1990 Unspecified osteoarthritis, unspecified site: Secondary | ICD-10-CM | POA: Diagnosis present

## 2012-10-24 DIAGNOSIS — D696 Thrombocytopenia, unspecified: Secondary | ICD-10-CM

## 2012-10-24 DIAGNOSIS — R739 Hyperglycemia, unspecified: Secondary | ICD-10-CM

## 2012-10-24 DIAGNOSIS — N179 Acute kidney failure, unspecified: Secondary | ICD-10-CM

## 2012-10-24 DIAGNOSIS — N289 Disorder of kidney and ureter, unspecified: Secondary | ICD-10-CM | POA: Diagnosis present

## 2012-10-24 DIAGNOSIS — E119 Type 2 diabetes mellitus without complications: Secondary | ICD-10-CM

## 2012-10-24 DIAGNOSIS — I10 Essential (primary) hypertension: Secondary | ICD-10-CM | POA: Diagnosis present

## 2012-10-24 DIAGNOSIS — R259 Unspecified abnormal involuntary movements: Secondary | ICD-10-CM | POA: Diagnosis present

## 2012-10-24 DIAGNOSIS — A419 Sepsis, unspecified organism: Secondary | ICD-10-CM

## 2012-10-24 LAB — CBC WITH DIFFERENTIAL/PLATELET
Eosinophils Relative: 1 % (ref 0–5)
Lymphocytes Relative: 9 % — ABNORMAL LOW (ref 12–46)
Lymphs Abs: 0.7 10*3/uL (ref 0.7–4.0)
MCV: 89.7 fL (ref 78.0–100.0)
Neutro Abs: 6.6 10*3/uL (ref 1.7–7.7)
Platelets: 144 10*3/uL — ABNORMAL LOW (ref 150–400)
RBC: 4.94 MIL/uL (ref 4.22–5.81)
WBC: 7.9 10*3/uL (ref 4.0–10.5)

## 2012-10-24 LAB — POCT I-STAT TROPONIN I: Troponin i, poc: 0.02 ng/mL (ref 0.00–0.08)

## 2012-10-24 LAB — URINALYSIS, ROUTINE W REFLEX MICROSCOPIC
Glucose, UA: NEGATIVE mg/dL
Leukocytes, UA: NEGATIVE
Nitrite: NEGATIVE
Protein, ur: NEGATIVE mg/dL
Urobilinogen, UA: 0.2 mg/dL (ref 0.0–1.0)

## 2012-10-24 LAB — LACTIC ACID, PLASMA: Lactic Acid, Venous: 2.3 mmol/L — ABNORMAL HIGH (ref 0.5–2.2)

## 2012-10-24 LAB — COMPREHENSIVE METABOLIC PANEL
Albumin: 4.1 g/dL (ref 3.5–5.2)
BUN: 19 mg/dL (ref 6–23)
Chloride: 97 mEq/L (ref 96–112)
Creatinine, Ser: 1.61 mg/dL — ABNORMAL HIGH (ref 0.50–1.35)
GFR calc Af Amer: 46 mL/min — ABNORMAL LOW (ref >90)
Total Bilirubin: 0.8 mg/dL (ref 0.3–1.2)
Total Protein: 7.9 g/dL (ref 6.0–8.3)

## 2012-10-24 LAB — CREATININE, SERUM
Creatinine, Ser: 1.58 mg/dL — ABNORMAL HIGH (ref 0.50–1.35)
GFR calc non Af Amer: 41 mL/min — ABNORMAL LOW (ref >90)

## 2012-10-24 LAB — CBC
Hemoglobin: 15.2 g/dL (ref 13.0–17.0)
MCH: 31.6 pg (ref 26.0–34.0)
MCHC: 35.4 g/dL (ref 30.0–36.0)
Platelets: 130 10*3/uL — ABNORMAL LOW (ref 150–400)
RBC: 4.81 MIL/uL (ref 4.22–5.81)

## 2012-10-24 LAB — GLUCOSE, CAPILLARY
Glucose-Capillary: 93 mg/dL (ref 70–99)
Glucose-Capillary: 168 mg/dL — ABNORMAL HIGH (ref 70–99)

## 2012-10-24 LAB — PROTIME-INR
INR: 1.03 (ref 0.00–1.49)
Prothrombin Time: 13.3 seconds (ref 11.6–15.2)

## 2012-10-24 MED ORDER — INSULIN DETEMIR 100 UNIT/ML ~~LOC~~ SOLN
4.0000 [IU] | Freq: Every day | SUBCUTANEOUS | Status: DC
  Filled 2012-10-24: qty 0.04

## 2012-10-24 MED ORDER — ENOXAPARIN SODIUM 40 MG/0.4ML ~~LOC~~ SOLN
40.0000 mg | SUBCUTANEOUS | Status: DC
  Administered 2012-10-24 – 2012-10-26 (×3): 40 mg via SUBCUTANEOUS
  Filled 2012-10-24 (×3): qty 0.4

## 2012-10-24 MED ORDER — SODIUM CHLORIDE 0.9 % IJ SOLN
3.0000 mL | Freq: Two times a day (BID) | INTRAMUSCULAR | Status: DC
  Administered 2012-10-24 – 2012-10-25 (×2): 3 mL via INTRAVENOUS

## 2012-10-24 MED ORDER — ONDANSETRON HCL 4 MG/2ML IJ SOLN: 4.0000 mg | Freq: Four times a day (QID) | INTRAMUSCULAR | Status: DC | PRN

## 2012-10-24 MED ORDER — DOCUSATE SODIUM 100 MG PO CAPS
100.0000 mg | ORAL_CAPSULE | Freq: Two times a day (BID) | ORAL | Status: DC
  Administered 2012-10-24 – 2012-10-26 (×5): 100 mg via ORAL
  Filled 2012-10-24 (×6): qty 1

## 2012-10-24 MED ORDER — DEXTROSE 5 % IV SOLN
2.0000 g | Freq: Once | INTRAVENOUS | Status: AC
  Administered 2012-10-24: 2 g via INTRAVENOUS
  Filled 2012-10-24: qty 2

## 2012-10-24 MED ORDER — DEXTROSE 5 % IV SOLN
500.0000 mg | Freq: Once | INTRAVENOUS | Status: AC
  Administered 2012-10-24: 500 mg via INTRAVENOUS
  Filled 2012-10-24: qty 500

## 2012-10-24 MED ORDER — POLYETHYLENE GLYCOL 3350 17 G PO PACK
17.0000 g | PACK | Freq: Every day | ORAL | Status: DC | PRN
  Filled 2012-10-24: qty 1

## 2012-10-24 MED ORDER — ONDANSETRON HCL 4 MG PO TABS: 4.0000 mg | ORAL_TABLET | Freq: Four times a day (QID) | ORAL | Status: DC | PRN

## 2012-10-24 MED ORDER — SODIUM CHLORIDE 0.9 % IV BOLUS (SEPSIS)
1000.0000 mL | Freq: Once | INTRAVENOUS | Status: AC
  Administered 2012-10-24: 1000 mL via INTRAVENOUS

## 2012-10-24 MED ORDER — CHLORPROMAZINE HCL 25 MG/ML IJ SOLN
25.0000 mg | Freq: Three times a day (TID) | INTRAMUSCULAR | Status: DC | PRN
  Filled 2012-10-24: qty 1

## 2012-10-24 MED ORDER — ACETAMINOPHEN 325 MG PO TABS
650.0000 mg | ORAL_TABLET | Freq: Once | ORAL | Status: AC
  Administered 2012-10-24: 650 mg via ORAL
  Filled 2012-10-24: qty 2

## 2012-10-24 MED ORDER — ACETAMINOPHEN 325 MG PO TABS: 650.0000 mg | ORAL_TABLET | Freq: Four times a day (QID) | ORAL | Status: DC | PRN

## 2012-10-24 MED ORDER — VANCOMYCIN HCL 10 G IV SOLR
1500.0000 mg | Freq: Once | INTRAVENOUS | Status: AC
  Administered 2012-10-24: 1500 mg via INTRAVENOUS
  Filled 2012-10-24: qty 1500

## 2012-10-24 MED ORDER — CHLORPROMAZINE HCL 10 MG PO TABS
10.0000 mg | ORAL_TABLET | Freq: Three times a day (TID) | ORAL | Status: DC | PRN
  Administered 2012-10-24 – 2012-10-25 (×2): 10 mg via ORAL
  Filled 2012-10-24 (×2): qty 1

## 2012-10-24 MED ORDER — GLUCERNA SHAKE PO LIQD
237.0000 mL | ORAL | Status: DC
  Administered 2012-10-24 – 2012-10-26 (×3): 237 mL via ORAL

## 2012-10-24 MED ORDER — ACETAMINOPHEN 650 MG RE SUPP: 650.0000 mg | Freq: Four times a day (QID) | RECTAL | Status: DC | PRN

## 2012-10-24 MED ORDER — ZOLPIDEM TARTRATE 5 MG PO TABS: 5.0000 mg | ORAL_TABLET | Freq: Every evening | ORAL | Status: DC | PRN

## 2012-10-24 MED ORDER — INSULIN ASPART 100 UNIT/ML ~~LOC~~ SOLN
0.0000 [IU] | Freq: Three times a day (TID) | SUBCUTANEOUS | Status: DC
  Administered 2012-10-24: 2 [IU] via SUBCUTANEOUS
  Administered 2012-10-24: 1 [IU] via SUBCUTANEOUS
  Administered 2012-10-25 (×2): 2 [IU] via SUBCUTANEOUS
  Administered 2012-10-25: 1 [IU] via SUBCUTANEOUS
  Administered 2012-10-26: 2 [IU] via SUBCUTANEOUS
  Administered 2012-10-26: 1 [IU] via SUBCUTANEOUS

## 2012-10-24 MED ORDER — POTASSIUM CHLORIDE IN NACL 20-0.9 MEQ/L-% IV SOLN
INTRAVENOUS | Status: DC
  Administered 2012-10-24 – 2012-10-26 (×4): via INTRAVENOUS
  Filled 2012-10-24 (×6): qty 1000

## 2012-10-24 MED ORDER — VANCOMYCIN HCL IN DEXTROSE 750-5 MG/150ML-% IV SOLN
750.0000 mg | INTRAVENOUS | Status: DC
  Administered 2012-10-25: 750 mg via INTRAVENOUS
  Filled 2012-10-24: qty 150

## 2012-10-24 MED ORDER — CEFTRIAXONE SODIUM 1 G IJ SOLR
1.0000 g | INTRAMUSCULAR | Status: DC
  Administered 2012-10-25 – 2012-10-26 (×2): 1 g via INTRAVENOUS
  Filled 2012-10-24 (×3): qty 10

## 2012-10-24 NOTE — Progress Notes (Signed)
Clinical Social Work Department BRIEF PSYCHOSOCIAL ASSESSMENT 10/24/2012  Patient:  Tony Vasquez,Tony Vasquez     Account Number:  [id number]     Admit date:  10/24/2012  Clinical Social Worker:  Harless Nakayama  Date/Time:  10/24/2012 04:00 PM  Referred by:  Physician  Date Referred:  10/24/2012 Referred for  SNF Placement   Other Referral:   Interview type:  Family Other interview type:   CSW spoke with pt daughter while pt present in pt room    PSYCHOSOCIAL DATA Living Status:  WITH ADULT CHILDREN Admitted from facility:   Level of care:   Primary support name:  [name] [phone] Primary support relationship to patient:  CHILD, ADULT Degree of support available:   Pt has good family support from children    CURRENT CONCERNS Current Concerns  Post-Acute Placement   Other Concerns:    SOCIAL WORK ASSESSMENT / PLAN CSW became aware that PT is recommending CIR. CSW spoke with family about back up as SNF. Pt daughter unsure if this will be the desired back up plan and asked CSW about other options. CSW explained the difference between SNF and HH to pt daughter. Pt daughter told CSW that she will need to discuss with her siblings but does not think they would like to pursue SNF for ST rehab. Pt daughter told CSW that pt lives with son and if needed arrangements could be made for 24 hr supervision. Pt daughter to call CSW after speaking with siblings and inform of decision for backup for CIR.   Assessment/plan status:  Psychosocial Support/Ongoing Assessment of Needs Other assessment/ plan:   Information/referral to community resources:   SNF list provided    PATIENT'S/FAMILY'S RESPONSE TO PLAN OF CARE: Pt daughter not agreeable to SNF at this time but is going to discuss with siblings.       Brannon Levene, LCSWA [phone]

## 2012-10-24 NOTE — ED Notes (Signed)
Attempted to call report to 3W, RN Noreene Larsson currently not available to take report. This RN provided contact number and requested call after 3W RN has completed patient care.

## 2012-10-24 NOTE — Progress Notes (Signed)
ANTIBIOTIC CONSULT NOTE - INITIAL  Pharmacy Consult for Vancomycin Indication: rule out pneumonia  Allergies  Allergen Reactions  . Shrimp [Shellfish Allergy] Anaphylaxis    Patient Measurements: Height: 5\' 5"  (165.1 cm) Weight: 175 lb (79.379 kg) IBW/kg (Calculated) : 61.5  Vital Signs: Temp: 98.3 F (36.8 C) (09/08 0646) Temp src: Oral (09/08 0646) BP: 138/68 mmHg (09/08 0646) Pulse Rate: 76 (09/08 0646) Intake/Output from previous day:   Intake/Output from this shift:    Labs:  Recent Labs  10/24/12 0205  WBC 7.9  HGB 16.0  PLT 144*  CREATININE 1.61*   Estimated Creatinine Clearance: 37.3 ml/min (by C-G formula based on Cr of 1.61). No results found for this basename: VANCOTROUGH, VANCOPEAK, VANCORANDOM, GENTTROUGH, GENTPEAK, GENTRANDOM, TOBRATROUGH, TOBRAPEAK, TOBRARND, AMIKACINPEAK, AMIKACINTROU, AMIKACIN,  in the last 72 hours   Microbiology: No results found for this or any previous visit (from the past 720 hour(s)).  Medical History: Past Medical History  Diagnosis Date  . Diabetes mellitus   . Hypertension   . Shortness of breath     Medications:  Prescriptions prior to admission  Medication Sig Dispense Refill  . benazepril (LOTENSIN) 10 MG tablet Take 10 mg by mouth daily.      . furosemide (LASIX) 20 MG tablet Take 20 mg by mouth daily.      Marland Kitchen glipiZIDE (GLUCOTROL) 5 MG tablet Take 5 mg by mouth 2 (two) times daily before a meal.      . metFORMIN (GLUCOPHAGE) 500 MG tablet Take 500 mg by mouth 2 (two) times daily with a meal.       Assessment: 79 yo male with possible PNA for empiric antibiotics  Goal of Therapy:  Vancomycin trough level 15-20 mcg/ml  Plan:  Vancomycin 1500 mg IV now, then 750 mg IV q24h  Eddie Candle 10/24/2012,6:52 AM

## 2012-10-24 NOTE — ED Notes (Addendum)
Patient here tonight via EMS. Called by family, patient endorses no problems. Per EMS patient has been having gradually worsening gait problems over the past week, fell and hit his head 1 day ago, difficulty has increased rapidly since then.

## 2012-10-24 NOTE — Evaluation (Signed)
Physical Therapy Evaluation Patient Details Name: Tony Vasquez MRN: [id number] DOB: 07/10/1932 Today's Date: 10/24/2012 Time: 4098-1191 PT Time Calculation (min): 37 min  PT Assessment / Plan / Recommendation History of Present Illness  Pt adm with fevers-  Possible sepsis vs pneumonia based on high grade fevers.  Clinical Impression  Pt admitted with above. Pt currently with functional limitations due to the deficits listed below (see PT Problem List).  Pt will benefit from skilled PT to increase their independence and safety with mobility to allow discharge to the venue listed below. If pt progresses quickly may be able to go straight home with [facility].      PT Assessment  Patient needs continued PT services    Follow Up Recommendations  CIR    Does the patient have the potential to tolerate intense rehabilitation      Barriers to Discharge        Equipment Recommendations  Rolling walker with 5" wheels    Recommendations for Other Services     Frequency Min 3X/week    Precautions / Restrictions Precautions Precautions: Fall   Pertinent Vitals/Pain N/A      Mobility  Bed Mobility Bed Mobility: Supine to Sit;Sitting - Scoot to Edge of Bed Supine to Sit: 2: Max assist Sitting - Scoot to [place] of Bed: 2: Max assist Details for Bed Mobility Assistance: Assist to bring legs off and trunk up. Transfers Transfers: Sit to Stand;Stand to Sit Sit to Stand: 3: Mod assist;With upper extremity assist;From bed;From chair/3-in-1;With armrests Stand to Sit: 4: Min assist;With upper extremity assist;With armrests;To chair/3-in-1 Details for Transfer Assistance: Assist to bring hips up.  Multiple cues to initiate sitting when standing in front of chair. Ambulation/Gait Ambulation/Gait Assistance: 3: Mod assist Ambulation Distance (Feet): 10 Feet Assistive device: Rolling walker Ambulation/Gait Assistance Details: Continual verbal/tactile cues to initiate stepping and to incr step  length. Gait Pattern: Step-to pattern;Decreased step length - right;Decreased step length - left;Shuffle;Trunk flexed Gait velocity: very slow    Exercises     PT Diagnosis: Difficulty walking;Generalized weakness;Altered mental status  PT Problem List: Decreased strength;Decreased activity tolerance;Decreased balance;Decreased mobility;Decreased knowledge of use of DME;Decreased cognition;Decreased knowledge of precautions PT Treatment Interventions: DME instruction;Gait training;Functional mobility training;Therapeutic activities;Therapeutic exercise;Patient/family education;Balance training     PT Goals(Current goals can be found in the care plan section) Acute Rehab PT Goals Patient Stated Goal: Go home PT Goal Formulation: With patient Time For Goal Achievement: 10/31/12 Potential to Achieve Goals: Fair  Visit Information  Last PT Received On: 10/24/12 Assistance Needed: +1 History of Present Illness: Pt adm with fevers-  Possible sepsis vs pneumonia based on high grade fevers.       Prior Functioning  Home Living Family/patient expects to be discharged to:: Private residence Living Arrangements: Children Available Help at Discharge: Family;Available 24 hours/day Type of Home: House Home Access: Stairs to enter [employer] of Steps: 1 Home Layout: One level Home Equipment: None Prior Function Level of Independence: Independent Comments: Amb without difficulty. Communication Communication: No difficulties    Cognition  Cognition Arousal/Alertness: Awake/alert Behavior During Therapy: WFL for tasks assessed/performed Overall Cognitive Status: Impaired/Different from baseline Area of Impairment: Following commands;Problem solving Following Commands: Follows one step commands with increased time Problem Solving: Slow processing;Decreased initiation;Requires tactile cues;Requires verbal cues    Extremity/Trunk Assessment Upper Extremity Assessment Upper  Extremity Assessment: Defer to OT evaluation Lower Extremity Assessment Lower Extremity Assessment: Generalized weakness   Balance Balance Balance Assessed: Yes Static Sitting Balance Static  Sitting - Balance Support: Bilateral upper extremity supported Static Sitting - Level of Assistance: 4: Min assist (posterior lean) Static Standing Balance Static Standing - Balance Support: Bilateral upper extremity supported Static Standing - Level of Assistance: 4: Min assist  End of Session PT - End of Session Equipment Utilized During Treatment: Gait belt Activity Tolerance: Patient limited by fatigue Patient left: with call bell/phone within reach;in chair;with family/visitor present Nurse Communication: Mobility status  GP     Khayri Kargbo 10/24/2012, 3:27 PM  [employer] PT [phone]

## 2012-10-24 NOTE — Progress Notes (Signed)
Rehab Admissions Coordinator Note:  Patient was screened by Clois Dupes for appropriateness for an Inpatient Acute Rehab Consult.  [employer] medicare will not approve an inpt rehab admission for this diagnosis.  At this time, we are recommending Skilled Nursing Facility or home with [facility] and assist of family.  Clois Dupes 10/24/2012, 6:47 PM  I can be reached at [phone].

## 2012-10-24 NOTE — ED Provider Notes (Signed)
CSN: [id number]     Arrival date & time 10/24/12  0129 History   First MD Initiated Contact with Patient 10/24/12 0144     Chief Complaint  Patient presents with  . Gait Problem  . Fall   (Consider location/radiation/quality/duration/timing/severity/associated sxs/prior Treatment) HPI  Tony Vasquez is a pleasant elderly man with NIDDM and HTN who lives with his son. He is BIB EMS at the request of his son. Son noticed that the patient seemed generally weak, more tremulous than normal and just not acting like himself. He first became concerned around 1700 yesterday when the patient did not eat all of his dinner. Son has noticed that the patient has had a mild cough. The patient has not had any complaints.   Patient says he does not know why he is here in the ED. He denies h/a, cp, sob, abdominal pain, N/V/D and GU sx. Patient says he felt a little unsteady on his feet tonight and generally weak. The patient typically ambulates without a walker. He admits to a mild productive cough.   No recent med changes. No history of cardiopulmonary disease.   Past Medical History  Diagnosis Date  . Diabetes mellitus   . Hypertension   . Shortness of breath    Past Surgical History  Procedure Laterality Date  . Hernia repair     History reviewed. No pertinent family history. History  Substance Use Topics  . Smoking status: Never Smoker   . Smokeless tobacco: Never Used  . Alcohol Use: No    Review of Systems Gen: As per history of present illness, otherwise negative Eyes: no discharge or drainage, no occular pain or visual changes Nose: no epistaxis or rhinorrhea Mouth: no dental pain, no sore throat Neck: no neck pain Lungs: As per history of present illness, otherwise negative CV: no chest pain, palpitations, dependent edema or orthopnea Abd: no abdominal pain, nausea, vomiting GU: no dysuria or gross hematuria MSK: no myalgias or arthralgias Neuro: The patient has a baseline non  intention tremor of his hands, no headache, no focal neurologic deficits Skin: no rash Psyche: negative.  Allergies  Review of patient's allergies indicates no known allergies.  Home Medications   Current Outpatient Rx  Name  Route  Sig  Dispense  Refill  . benazepril (LOTENSIN) 10 MG tablet   Oral   Take 10 mg by mouth daily.         . furosemide (LASIX) 20 MG tablet   Oral   Take 20 mg by mouth daily.         Marland Kitchen glipiZIDE (GLUCOTROL) 5 MG tablet   Oral   Take 5 mg by mouth 2 (two) times daily before a meal.         . metFORMIN (GLUCOPHAGE) 500 MG tablet   Oral   Take 500 mg by mouth 2 (two) times daily with a meal.         . metoprolol tartrate (LOPRESSOR) 12.5 mg TABS   Oral   Take 0.5 tablets (12.5 mg total) by mouth 2 (two) times daily.          BP 147/74  Pulse 112  Temp(Src) 102.2 F (39 C) (Rectal)  Resp 18  SpO2 98% Physical Exam Gen: well developed and well nourished appearing, appears ill Head: NCAT Eyes: PERL, EOMI Nose: no epistaixis or rhinorrhea Mouth/throat: mucosa is mildly dehydrated appearing and pink Neck: supple, no stridor, no jvd Lungs: RR 28 to 32/min, BS diminished both bases,  no rales or rhonchi appreciated CV: rapid and regular, pulse 112 bpm, extremities well perfused, no murmur Abd: soft, notender, nondistended Back: no ttp, no cva ttp Skin: no rash, wnl Neuro: CN ii-xii grossly intact, no focal deficits Psyche; normal affect,  calm and cooperative.   ED Course  Procedures (including critical care time) Labs Review  Results for orders placed during the hospital encounter of 10/24/12 (from the past 24 hour(s))  GLUCOSE, CAPILLARY     Status: Abnormal   Collection Time    10/24/12  1:46 AM      Result Value Range   Glucose-Capillary 171 (*) 70 - 99 mg/dL  COMPREHENSIVE METABOLIC PANEL     Status: Abnormal   Collection Time    10/24/12  2:05 AM      Result Value Range   Sodium 136  135 - 145 mEq/L   Potassium 3.6   3.5 - 5.1 mEq/L   Chloride 97  96 - 112 mEq/L   CO2 24  19 - 32 mEq/L   Glucose, Bld 178 (*) 70 - 99 mg/dL   BUN 19  6 - 23 mg/dL   Creatinine, Ser 4.78 (*) 0.50 - 1.35 mg/dL   Calcium 9.9  8.4 - 29.5 mg/dL   Total Protein 7.9  6.0 - 8.3 g/dL   Albumin 4.1  3.5 - 5.2 g/dL   AST 14  0 - 37 U/L   ALT 11  0 - 53 U/L   Alkaline Phosphatase 58  39 - 117 U/L   Total Bilirubin 0.8  0.3 - 1.2 mg/dL   GFR calc non Af Amer 40 (*) >90 mL/min   GFR calc Af Amer 46 (*) >90 mL/min  CBC WITH DIFFERENTIAL     Status: Abnormal   Collection Time    10/24/12  2:05 AM      Result Value Range   WBC 7.9  4.0 - 10.5 K/uL   RBC 4.94  4.22 - 5.81 MIL/uL   Hemoglobin 16.0  13.0 - 17.0 g/dL   HCT 62.1  30.8 - 65.7 %   MCV 89.7  78.0 - 100.0 fL   MCH 32.4  26.0 - 34.0 pg   MCHC 36.1 (*) 30.0 - 36.0 g/dL   RDW 84.6  96.2 - 95.2 %   Platelets 144 (*) 150 - 400 K/uL   Neutrophils Relative % 83 (*) 43 - 77 %   Neutro Abs 6.6  1.7 - 7.7 K/uL   Lymphocytes Relative 9 (*) 12 - 46 %   Lymphs Abs 0.7  0.7 - 4.0 K/uL   Monocytes Relative 7  3 - 12 %   Monocytes Absolute 0.5  0.1 - 1.0 K/uL   Eosinophils Relative 1  0 - 5 %   Eosinophils Absolute 0.1  0.0 - 0.7 K/uL   Basophils Relative 0  0 - 1 %   Basophils Absolute 0.0  0.0 - 0.1 K/uL  PROTIME-INR     Status: None   Collection Time    10/24/12  2:05 AM      Result Value Range   Prothrombin Time 13.3  11.6 - 15.2 seconds   INR 1.03  0.00 - 1.49  POCT I-STAT TROPONIN I     Status: None   Collection Time    10/24/12  2:23 AM      Result Value Range   Troponin i, poc 0.02  0.00 - 0.08 ng/mL   Comment 3  LACTIC ACID, PLASMA     Status: Abnormal   Collection Time    10/24/12  2:40 AM      Result Value Range   Lactic Acid, Venous 2.3 (*) 0.5 - 2.2 mmol/L   EKG: sinus tach with RAD, no acute ischemic changes, normal ST - T segments.   CXR: no acute process.     MDM  The patient is septic appearing with fever, tachypnea and tachycardia.  Clinical hx raises concern for community acquired pneumonia. CXR is pending along with blood cultures and basic labs. The patient will require admission. We will tx empirically with Ceftriaxone and Azithromycin and resuscitate with IVF. The patient's 02 sats are 98%. But, we will place on 2L [place] to support increased WOB.   CXR is nondiagnostic. WBC wnl but, with mild left shift. Mild thrombocytopenia is noted and mildly increased lactic acid at 2.3 - upper limit of normal. We are fluid resuscitating. Urinalysis is pending. Although, sx point to early pneumonia.   I have discussed the case with the physician on call for [facility]. He requests that I admit the patient to Dr. Wylene Simmer, Tele Unit.   The patient looks clinically improved with resuscitation and has pulse of 90 now with sats of 100% on 2L, normal BP and RR of 24/min on 2L.   CRITICAL CARE Performed by: Brandt Loosen   Total critical care time: 35  Critical care time was exclusive of separately billable procedures and treating other patients.  Critical care was necessary to treat or prevent imminent or life-threatening deterioration.  Critical care was time spent personally by me on the following activities: development of treatment plan with patient and/or surrogate as well as nursing, discussions with consultants, evaluation of patient's response to treatment, examination of patient, obtaining history from patient or surrogate, ordering and performing treatments and interventions, ordering and review of laboratory studies, ordering and review of radiographic studies, pulse oximetry and re-evaluation of patient's condition.     Brandt Loosen, MD 10/24/12 [phone]

## 2012-10-24 NOTE — H&P (Signed)
PCP:   Gaspar Garbe, MD   Chief Complaint:  Weak, unsteady, fevers  HPI: Tony Vasquez is a 78 year old male with a history of DM2 and HTN who presented to the ER late last night after feeling weak.  He did note eat all of his dinner and has had a mild cough for a couple of days.  He came in confused as to why he was here.  He notes mostly that he feels weak.  His son, who he lives with, provided history as Tony Vasquez doesn't have much other insight and says that everything is "fine."  Per the ER workup, his temp is 102F, with a negative appearing CXR and U/A, but is a bit dehydrated based on his labs and his poor PO intake.  I was asked to admit the patient per the ER MD this morning.  He was also noted as having a "tremor" for the past few months and has shaking all over today coming off his fever.  Review of Systems:  Review of Systems - Negative on 12 point review except for general weakness, poor PO intake and a cough.  He denies overt SOB or DOE, but is weak otherwise. Past Medical History: Past Medical History  Diagnosis Date  . Diabetes mellitus   . Hypertension   . Shortness of breath    Past Surgical History  Procedure Laterality Date  . Hernia repair      Medications: Prior to Admission medications   Medication Sig Start Date End Date Taking? Authorizing Provider  benazepril (LOTENSIN) 10 MG tablet Take 10 mg by mouth daily. 09/21/12  Yes Historical Provider, MD  furosemide (LASIX) 20 MG tablet Take 20 mg by mouth daily. 10/08/12  Yes Historical Provider, MD  glipiZIDE (GLUCOTROL) 5 MG tablet Take 5 mg by mouth 2 (two) times daily before a meal.   Yes Historical Provider, MD  metFORMIN (GLUCOPHAGE) 500 MG tablet Take 500 mg by mouth 2 (two) times daily with a meal.   Yes Historical Provider, MD    Allergies:   Allergies  Allergen Reactions  . Shrimp [Shellfish Allergy] Anaphylaxis    Social History:  reports that he has never smoked. He has never used smokeless  tobacco. He reports that he does not drink alcohol or use illicit drugs.  Family History: History reviewed. No pertinent family history.  Physical Exam: Filed Vitals:   10/24/12 0136 10/24/12 0142 10/24/12 0420 10/24/12 0515  BP: 147/74  120/61 124/59  Pulse: 112  89 74  Temp: 101.5 F (38.6 C) 102.2 F (39 C) 98.4 F (36.9 C)   TempSrc: Oral Rectal Oral   Resp: 18  20 17   SpO2: 98%  100% 100%   General appearance: cooperative, appears stated age and fatigued Head: Normocephalic, without obvious abnormality, atraumatic Eyes: conjunctivae/corneas clear. PERRL, EOM's intact.  Nose: Nares normal. Septum midline. Mucosa normal. No drainage or sinus tenderness. Throat: lips, mucosa, and tongue normal; teeth and gums normal Neck: no adenopathy, no carotid bruit, no JVD and thyroid not enlarged, symmetric, no tenderness/mass/nodules Resp: clear to auscultation bilaterally Cardio: regular rate and rhythm, S1, S2 normal, no murmur, click, rub or gallop GI: soft, non-tender; bowel sounds normal; no masses,  no organomegaly Extremities: extremities normal, atraumatic, no cyanosis or edema Pulses: 2+ and symmetric Lymph nodes: Cervical adenopathy: no cervical lymphadenopathy Neurologic: Alert and oriented, but not sure immediately why he is in the hospital. normal strength and tone. Normal symmetric reflexes. Shaking all over, able to  eat ok and coordination shows no lateralization.  Labs on Admission:   Recent Labs  10/24/12 0205  NA 136  K 3.6  CL 97  CO2 24  GLUCOSE 178*  BUN 19  CREATININE 1.61*  CALCIUM 9.9    Recent Labs  10/24/12 0205  AST 14  ALT 11  ALKPHOS 58  BILITOT 0.8  PROT 7.9  ALBUMIN 4.1    Recent Labs  10/24/12 0205  WBC 7.9  NEUTROABS 6.6  HGB 16.0  HCT 44.3  MCV 89.7  PLT 144*    Lab Results  Component Value Date   INR 1.03 10/24/2012    Radiological Exams on Admission: Dg Chest 2 View  10/24/2012   *RADIOLOGY REPORT*  Clinical Data:  Unsteady gait.  Fall.  CHEST - 2 VIEW  Comparison: 05/15/2011  Findings: Shallow inspiration. The heart size and pulmonary vascularity are normal. The lungs appear clear and expanded without focal air space disease or consolidation. No blunting of the costophrenic angles.  No pneumothorax.  Mediastinal contours appear intact.  Degenerative changes in the spine and shoulders.  No significant changes since the previous study.  IMPRESSION: No evidence of active pulmonary disease.   Original Report Authenticated By: Burman Nieves, M.D.   Orders placed during the hospital encounter of 10/24/12  . EKG 12-LEAD  . EKG 12-LEAD    Assessment/Plan Fevers-  Possible sepsis vs pneumonia based on high grade fevers.  Could also be viral respiratory illness.  Will hydrate and repeat CXR in 24 hours.  Initial treatment with empiric Vancomycin and Rocephin.  He doesn't have an elevated WBC count at this time either, so will continue to watch.  U/A is normal, likely ruling out urosepsis.  DM@- Given poor PO intake and higher Cr, will hold orals for now and use low dose basal insulin and SSI.  HTN- Hold diuretic and ACE due to elevation in Cr and dehydration  Dehydration-  Will encourage PO, nutrition consult as well as IVF replacement.  PT/OT eval for home health needs due to weakness.  Tremor.  Will reassess once he has normal temp.  May need outpatient Neuro eval for this.  Sherwin Hollingshed W 10/24/2012, 6:13 AM

## 2012-10-24 NOTE — Progress Notes (Signed)
Utilization review complete. Jovontae Banko RN CCM Case Mgmt phone [phone] 

## 2012-10-24 NOTE — Progress Notes (Signed)
INITIAL NUTRITION ASSESSMENT  DOCUMENTATION CODES Per approved criteria  -Not Applicable   INTERVENTION: 1. Glucerna Shake po daily, each supplement provides 220 kcal and 10 grams of protein.   NUTRITION DIAGNOSIS: Inadequate oral intake related to decreased appetite  as evidenced by notes.   Goal: PO intake to meet >/=90% estimated nutrition needs.   Monitor:  PO intake, weight trends, labs   Reason for Assessment: Consult  79 y.o. male  Admitting Dx: <principal problem not specified>  ASSESSMENT: Pt brought from home with weakness, poor oral intake and fever. Possible sepsis vs PNA.   Spoke with son at bedside, but not the son who he lives with. Unsure if the pt has lost any weight or has been eating less. Pt states his appetite is WNL. Does have issues with chewing related to poor dentition, requesting ground diet. RD will change to Dysphagia 3 diet. Pt with 100% meal completion when documented.   Nutrition Focused Physical Exam completed by this RD, no signs of muscle or fat wasting noted.     Height: Ht Readings from Last 1 Encounters:  10/24/12 5\' 5"  (1.651 m)    Weight: Wt Readings from Last 1 Encounters:  10/24/12 175 lb (79.379 kg)    Ideal Body Weight: 136 lbs   % Ideal Body Weight: 129%  Wt Readings from Last 10 Encounters:  10/24/12 175 lb (79.379 kg)  05/14/11 162 lb 7.7 oz (73.7 kg)    Usual Body Weight: unsure   % Usual Body Weight: --  BMI:  Body mass index is 29.12 kg/(m^2). Overweight   Estimated Nutritional Needs: Kcal: 1800-2000 Protein: 80-95 gm  Fluid: 1.8-2 L   Skin: intact   Diet Order: Carb Control  EDUCATION NEEDS: -No education needs identified at this time   Intake/Output Summary (Last 24 hours) at 10/24/12 1159 Last data filed at 10/24/12 0900  Gross per 24 hour  Intake    240 ml  Output    200 ml  Net     40 ml    Last BM: PTA    Labs:   Recent Labs Lab 10/24/12 0205 10/24/12 0811  NA 136  --   K  3.6  --   CL 97  --   CO2 24  --   BUN 19  --   CREATININE 1.61* 1.58*  CALCIUM 9.9  --   GLUCOSE 178*  --     CBG (last 3)   Recent Labs  10/24/12 0146 10/24/12 0754  GLUCAP 171* 93    Scheduled Meds: . [START ON 10/25/2012] cefTRIAXone (ROCEPHIN)  IV  1 g Intravenous Q24H  . docusate sodium  100 mg Oral BID  . enoxaparin (LOVENOX) injection  40 mg Subcutaneous Q24H  . insulin aspart  0-9 Units Subcutaneous TID WC  . sodium chloride  3 mL Intravenous Q12H  . [START ON 10/25/2012] vancomycin  750 mg Intravenous Q24H    Continuous Infusions: . 0.9 % NaCl with KCl 20 mEq / L 75 mL/hr at 10/24/12 1610    Past Medical History  Diagnosis Date  . Diabetes mellitus   . Hypertension   . Shortness of breath     Past Surgical History  Procedure Laterality Date  . Hernia repair      Clarene Duke RD, LDN Pager [phone] After Hours pager [phone]

## 2012-10-25 ENCOUNTER — Inpatient Hospital Stay (HOSPITAL_COMMUNITY): Payer: Medicare Other

## 2012-10-25 LAB — CBC
HCT: 38.3 % — ABNORMAL LOW (ref 39.0–52.0)
MCH: 31.7 pg (ref 26.0–34.0)
MCV: 87.4 fL (ref 78.0–100.0)
RDW: 13.1 % (ref 11.5–15.5)
WBC: 5.7 10*3/uL (ref 4.0–10.5)

## 2012-10-25 LAB — GLUCOSE, CAPILLARY
Glucose-Capillary: 163 mg/dL — ABNORMAL HIGH (ref 70–99)
Glucose-Capillary: 148 mg/dL — ABNORMAL HIGH (ref 70–99)

## 2012-10-25 LAB — COMPREHENSIVE METABOLIC PANEL
AST: 28 U/L (ref 0–37)
Albumin: 3 g/dL — ABNORMAL LOW (ref 3.5–5.2)
Alkaline Phosphatase: 41 U/L (ref 39–117)
BUN: 14 mg/dL (ref 6–23)
Chloride: 100 mEq/L (ref 96–112)
Potassium: 4 mEq/L (ref 3.5–5.1)
Sodium: 133 mEq/L — ABNORMAL LOW (ref 135–145)
Total Bilirubin: 0.8 mg/dL (ref 0.3–1.2)
Total Protein: 6.3 g/dL (ref 6.0–8.3)

## 2012-10-25 LAB — URINE CULTURE

## 2012-10-25 NOTE — Progress Notes (Signed)
Physical Therapy Treatment Patient Details Name: Tony Vasquez MRN: [id number] DOB: 1932-11-25 Today's Date: 10/25/2012 Time: 4034-7425 PT Time Calculation (min): 38 min  PT Assessment / Plan / Recommendation  History of Present Illness Pt adm with fevers-  Possible sepsis vs pneumonia based on high grade fevers.   PT Comments   Worked with patient and family during today's session with therapies. Patient continues to demonstrate significant deficits in cognition, mobility, function, and awareness. 3 of the patients children were present for the session to observe amount of assist needed and level of care that will be required. At this time, family all works and is unable to arrange appropriate level of care and assist needed for patient to go home safely. Discussed discharge options with family at length. Through discussion it appears there was an apparent communication breakdown within family circle regarding plan for discharge. At this time, patients family is in agreement that patient will need continued rehab. Will continue to see to facilitate dc home ST SNF for further rehabilitation following acute hospital stay.   Follow Up Recommendations  SNF           Equipment Recommendations  Rolling walker with 5" wheels    Recommendations for Other Services    Frequency Min 3X/week   Progress towards PT Goals Progress towards PT goals: Not progressing toward goals - comment  Plan Discharge plan needs to be updated    Precautions / Restrictions Precautions Precautions: Fall   Pertinent Vitals/Pain No pain at this time    Mobility  Bed Mobility Bed Mobility: Supine to Sit;Sitting - Scoot to Edge of Bed Supine to Sit: 2: Max assist Sitting - Scoot to [place] of Bed: 2: Max assist Details for Bed Mobility Assistance: Assist to bring legs off and trunk up. Transfers Transfers: Sit to Stand;Stand to Sit Sit to Stand: 3: Mod assist;With upper extremity assist;From bed;From  chair/3-in-1;With armrests Stand to Sit: 4: Min assist;With upper extremity assist;With armrests;To chair/3-in-1 Details for Transfer Assistance: Assist to bring hips up.  Multiple cues to initiate sitting when standing in front of chair. Ambulation/Gait Ambulation/Gait Assistance: 3: Mod assist Ambulation Distance (Feet): 3 Feet Ambulation/Gait Assistance Details: significant posterior lean requiring increased assist, very minimal steps, difficulty keeping weight shift in alignment for standing and ambulating Gait Pattern: Step-to pattern;Decreased step length - right;Decreased step length - left;Shuffle;Trunk flexed Gait velocity: very slow    Exercises  Ankle pumps, both Long arc quad, both Straight leg raise, both Trunk control activities with reaching in sitting and standing     PT Goals (current goals can now be found in the care plan section) Acute Rehab PT Goals Patient Stated Goal: Go home PT Goal Formulation: With patient Time For Goal Achievement: 10/31/12 Potential to Achieve Goals: Fair  Visit Information  Last PT Received On: 10/25/12 Assistance Needed: +1 History of Present Illness: Pt adm with fevers-  Possible sepsis vs pneumonia based on high grade fevers.    Subjective Data  Patient Stated Goal: Go home   Cognition  Cognition Arousal/Alertness: Awake/alert Behavior During Therapy: WFL for tasks assessed/performed Overall Cognitive Status: Impaired/Different from baseline Area of Impairment: Following commands;Problem solving;Safety/judgement;Awareness;Memory;Attention Following Commands: Follows one step commands with increased time Problem Solving: Slow processing;Decreased initiation;Requires tactile cues;Requires verbal cues General Comments: FAmily staes substantial change in cognition    Balance  Balance Balance Assessed: Yes Static Sitting Balance Static Sitting - Balance Support: Bilateral upper extremity supported Static Sitting - Level of  Assistance: 4: Min assist (  posterior lean) Static Standing Balance Static Standing - Balance Support: Bilateral upper extremity supported Static Standing - Level of Assistance: 4: Min assist  End of Session PT - End of Session Equipment Utilized During Treatment: Gait belt Activity Tolerance: Patient limited by fatigue Patient left: with call bell/phone within reach;in chair;with family/visitor present Nurse Communication: Mobility status   GP     Fabio Asa 10/25/2012, 2:08 PM Charlotte Crumb, PT DPT  [phone]

## 2012-10-25 NOTE — Progress Notes (Signed)
Occupational Therapy Evaluation Patient Details Name: Tony Vasquez MRN: [id number] DOB: 1932-12-04 Today's Date: 10/25/2012 Time: 9147-8295 OT Time Calculation (min): 43 min  OT Assessment / Plan / Recommendation History of present illness Pt adm with fevers-  Possible sepsis vs pneumonia based on high grade fevers. Fell Friday before admission and struck his head. CT (-).   Clinical Impression   PTA, pt independent with all ADL and mobility. Pt with significant functional decline, requiring Max A for ADL and transfers at this time. Pt with significant cognitive changes and poor postural control with posterior lean and R bias. Family is not able to provide 24/7 assistance, therefore pt iwll need SF for rehab to return to PLOF. Pt will benefit from skilled OT services to facilitate D/C to next venue due to below deficits.    OT Assessment  Patient needs continued OT Services    Follow Up Recommendations  SNF    Barriers to Discharge Decreased caregiver support family works during the day  Equipment Recommendations  3 in 1 bedside comode;Tub/shower bench    Recommendations for Other Services Rehab consult  Frequency  Min 2X/week    Precautions / Restrictions Precautions Precautions: Fall   Pertinent Vitals/Pain no apparent distress     ADL  Eating/Feeding: Minimal assistance;Supervision/safety Where Assessed - Eating/Feeding: Chair Grooming: Moderate assistance Where Assessed - Grooming: Supported sitting Upper Body Bathing: Moderate assistance Where Assessed - Upper Body Bathing: Supported sitting Lower Body Bathing: Maximal assistance Where Assessed - Lower Body Bathing: Supported sit to stand Upper Body Dressing: Moderate assistance Where Assessed - Upper Body Dressing: Unsupported sitting Lower Body Dressing: Maximal assistance Where Assessed - Lower Body Dressing: Supported sit to stand Toilet Transfer: Maximal assistance Toilet Transfer Method: Stand  Wellsite geologist: Other (comment) (to recliner) Equipment Used: Gait belt Transfers/Ambulation Related to ADLs: +2 for safety. Max A sit - stand and stand pivot ADL Comments: significant funcitonal decline    OT Diagnosis: Generalized weakness;Cognitive deficits;Altered mental status  OT Problem List: Decreased strength;Decreased activity tolerance;Impaired balance (sitting and/or standing);Decreased coordination;Decreased cognition;Decreased safety awareness;Decreased knowledge of use of DME or AE;Decreased knowledge of precautions OT Treatment Interventions: Self-care/ADL training;Therapeutic exercise;Neuromuscular education;DME and/or AE instruction;Therapeutic activities;Cognitive remediation/compensation;Patient/family education;Balance training   OT Goals(Current goals can be found in the care plan section) Acute Rehab OT Goals Patient Stated Goal: Go home OT Goal Formulation: With patient Time For Goal Achievement: 11/08/12 Potential to Achieve Goals: Good  Visit Information  Last OT Received On: 10/25/12 Assistance Needed: +1 PT/OT Co-Evaluation/Treatment: Yes History of Present Illness: Pt adm with fevers-  Possible sepsis vs pneumonia based on high grade fevers.       Prior Functioning     Home Living Family/patient expects to be discharged to:: Private residence Living Arrangements: Children Available Help at Discharge: Family;Available PRN/intermittently Type of Home: House Home Access: Stairs to enter [employer] of Steps: 1 Home Layout: One level Home Equipment: Cane - single point Prior Function Level of Independence: Independent Comments: Amb without difficulty. Communication Communication: No difficulties         Vision/Perception Vision - History Baseline Vision: Wears glasses only for reading Patient Visual Report: Other (comment) (pt unable to state) Vision - Assessment Eye Alignment: Within Functional Limits Vision  Assessment:  (need to further assess. decreased trackingin lower fields) Praxis Praxis: Impaired Praxis Impairment Details: Initiation Praxis-Other Comments: delayed   Cognition  Cognition Arousal/Alertness: Awake/alert Behavior During Therapy: WFL for tasks assessed/performed Overall Cognitive Status: Impaired/Different from baseline Area  of Impairment: Following commands;Problem solving;Safety/judgement;Awareness;Memory;Attention Following Commands: Follows one step commands with increased time Problem Solving: Slow processing;Decreased initiation;Requires tactile cues;Requires verbal cues General Comments: FAmily staes substantial change in cognition    Extremity/Trunk Assessment Upper Extremity Assessment Upper Extremity Assessment: Generalized weakness (decreased coordination. - gross and fine motor) Lower Extremity Assessment Lower Extremity Assessment: Generalized weakness Cervical / Trunk Assessment Cervical / Trunk Assessment: Other exceptions (r bias/posterior lean)     Mobility Bed Mobility Bed Mobility: Supine to Sit;Sitting - Scoot to Edge of Bed Supine to Sit: 2: Max assist Sitting - Scoot to [place] of Bed: 2: Max assist Details for Bed Mobility Assistance: Assist to bring legs off and trunk up. Transfers Transfers: Sit to Stand;Stand to Sit Sit to Stand: 3: Mod assist;With upper extremity assist;From bed;From chair/3-in-1;With armrests Stand to Sit: With upper extremity assist;With armrests;To chair/3-in-1;3: Mod assist Details for Transfer Assistance: Assist to bring hips up.  Multiple cues to initiate sitting when standing in front of chair.     Exercise Other Exercises Other Exercises: encouraged BUE AROM   Balance Balance Balance Assessed: Yes Static Sitting Balance Static Sitting - Balance Support: Bilateral upper extremity supported Static Sitting - Level of Assistance: 3: Mod assist (posterior lean; R bias) Static Standing Balance Static Standing -  Balance Support: Bilateral upper extremity supported Static Standing - Level of Assistance: 3: Mod assist   End of Session OT - End of Session Equipment Utilized During Treatment: Gait belt Activity Tolerance: Patient tolerated treatment well Patient left: in chair;with call bell/phone within reach;with family/visitor present Nurse Communication: Mobility status  GO     Belisa Eichholz,HILLARY 10/25/2012, 2:38 PM [facility], OTR/L  [phone] 10/25/2012

## 2012-10-25 NOTE — Progress Notes (Signed)
Subjective: Feeling well today.  I personally added up the carbs on his breakfast plate and they totaled 62 grams!   On carb modified diet!   I have asked his family to remove the potatoes and the juice from his plate (cutting to about 30g with french toast and fruit remaining).  [employer] Medicare will not cover CIR, family does not want SNF, so family is pursuing extra HH needs today.  He feels well, no cough this AM.  Objective: Vital signs in last 24 hours: Temp:  [99.6 F (37.6 C)-100.3 F (37.9 C)] 100 F (37.8 C) (09/09 0551) Pulse Rate:  [67-92] 67 (09/09 0551) Resp:  [18] 18 (09/09 0551) BP: (140-169)/(65-66) 143/65 mmHg (09/09 0551) SpO2:  [98 %] 98 % (09/09 0551) Weight change:  Last BM Date: 10/24/12  Intake/Output from previous day: 09/08 0701 - 09/09 0700 In: 460 [P.O.:460] Out: 900 [Urine:900] Intake/Output this shift:   General appearance: cooperative, appears stated age, more energetic and alert than yesterday. Head: Normocephalic, without obvious abnormality, atraumatic  Nose: Nares normal. Septum midline. Mucosa normal. No drainage or sinus tenderness.  Throat: lips, mucosa, and tongue normal; teeth and gums normal  Neck: no adenopathy, no carotid bruit, no JVD and thyroid not enlarged, symmetric, no tenderness/mass/nodules  Resp: clear to auscultation bilaterally  Cardio: regular rate and rhythm, S1, S2 normal, no murmur, click, rub or gallop  GI: soft, non-tender; bowel sounds normal; no masses, no organomegaly  Neurologic: Alert and oriented, much clearer today, no tremor and eating without difficulty. Lab Results:  Recent Labs  10/24/12 0811 10/25/12 0459  WBC 6.5 5.7  HGB 15.2 13.9  HCT 42.9 38.3*  PLT 130* 122*   BMET  Recent Labs  10/24/12 0205 10/24/12 0811 10/25/12 0459  NA 136  --  133*  K 3.6  --  4.0  CL 97  --  100  CO2 24  --  23  GLUCOSE 178*  --  135*  BUN 19  --  14  CREATININE 1.61* 1.58* 1.53*  CALCIUM 9.9  --  8.2*     Studies/Results: X-ray Chest Pa And Lateral   10/25/2012   *RADIOLOGY REPORT*  Clinical Data: Fever and shortness of breath  CHEST - 2 VIEW  Comparison:  October 24, 2012  Findings:  There is no edema or consolidation.  Heart size and pulmonary vascularity are normal.  No adenopathy.  There is degenerative change in the thoracic spine.  IMPRESSION: No edema or consolidation.   Original Report Authenticated By: Bretta Bang, M.D.   Dg Chest 2 View  10/24/2012   *RADIOLOGY REPORT*  Clinical Data: Unsteady gait.  Fall.  CHEST - 2 VIEW  Comparison: 05/15/2011  Findings: Shallow inspiration. The heart size and pulmonary vascularity are normal. The lungs appear clear and expanded without focal air space disease or consolidation. No blunting of the costophrenic angles.  No pneumothorax.  Mediastinal contours appear intact.  Degenerative changes in the spine and shoulders.  No significant changes since the previous study.  IMPRESSION: No evidence of active pulmonary disease.   Original Report Authenticated By: Burman Nieves, M.D.   Ct Head Wo Contrast  10/24/2012   *RADIOLOGY REPORT*  Clinical Data: The patient fell 1 day ago, striking the right temporoparietal area.  Bruising.  Unsteady gait and generalized weakness.  Mild productive cough for several days.  CT HEAD WITHOUT CONTRAST  Technique:  Contiguous axial images were obtained from the base of the skull through the vertex without contrast.  Comparison: 05/14/2011  Findings: Generalized cerebral atrophy.  Ventricular dilatation consistent with central atrophy.  Low attenuation change in the deep white matter consistent with small vessel ischemia.  No significant changes since the previous study.  No mass effect or midline shift.  No abnormal extra-axial fluid collections.  Gray-white matter junctions are distinct.  Basal cisterns are not effaced.  No acute intracranial hemorrhage.  No depressed skull fractures.  Visualized paranasal sinuses and  mastoid air cells are not opacified.  Vascular calcifications.  IMPRESSION: No acute intracranial abnormalities.  Chronic atrophy and small vessel ischemic changes appear stable.   Original Report Authenticated By: Burman Nieves, M.D.    Medications:  I have reviewed the patient's current medications. Scheduled: . cefTRIAXone (ROCEPHIN)  IV  1 g Intravenous Q24H  . docusate sodium  100 mg Oral BID  . enoxaparin (LOVENOX) injection  40 mg Subcutaneous Q24H  . feeding supplement  237 mL Oral Q24H  . insulin aspart  0-9 Units Subcutaneous TID WC  . sodium chloride  3 mL Intravenous Q12H   Continuous: . 0.9 % NaCl with KCl 20 mEq / L 75 mL/hr at 10/25/12 0032   VOZ:[id number], acetaminophen, chlorproMAZINE, ondansetron (ZOFRAN) IV, ondansetron, polyethylene glycol, zolpidem  Assessment/Plan: Fevers- Blood cultures and UCx remain negative as does CXR.Marland Kitchen  Fever has resolved.. Could also be viral respiratory illness. Will hydrate and continue Rocephin. He doesn't have an elevated WBC count at this time either, so will continue to watch. U/A is normal, likely ruling out urosepsis.  DM2- SSI. Still holding orals as Cr 1.5.  Will convert back tomorrow.  Noted his high carb breakfast as not acceptable and nurse will relay my math of his 60+ carb breakfast to nutrition services. HTN- Hold diuretic and ACE due to elevation in Cr and dehydration  Dehydration- Will encourage PO, nutrition consult as well as IVF replacement.  PT/OT eval for home health needs due to weakness. Does not want SNF per family and not qualified for CIR. Tremor.Resolved post fever.  Disposition:  Tomorrow most likely with PO abx to complete empiric course as well as resuming home meds and close outpatient f/u.  Family and social work to arrange [facility] needs today as well.    LOS: 1 day   [name],RICHARD W 10/25/2012, 8:22 AM

## 2012-10-26 LAB — GLUCOSE, CAPILLARY
Glucose-Capillary: 143 mg/dL — ABNORMAL HIGH (ref 70–99)
Glucose-Capillary: 175 mg/dL — ABNORMAL HIGH (ref 70–99)

## 2012-10-26 MED ORDER — AMOXICILLIN-POT CLAVULANATE 875-125 MG PO TABS: 1.0000 | ORAL_TABLET | Freq: Two times a day (BID) | ORAL | Status: DC

## 2012-10-26 NOTE — Progress Notes (Signed)
Family concerned about discharge today. Family understood that he would go to a short term snf for some rehab. CIR will not accept him due to [facility] will not cover it for this admission diagnosis. Dr. Wylene Simmer has been paged and this nurse is awaiting his return call.

## 2012-10-26 NOTE — Progress Notes (Signed)
CSW (Clinical Social Worker) prepared [employer] and placed with pt shadow chart. CSW informed family, Oceanographer, and pt nurse. Transport has been set up for 4:00pm. CSW signing off.  Aristide Waggle, LCSWA [phone]

## 2012-10-26 NOTE — Care Management Note (Signed)
    Page 1 of 1   10/26/2012     10:46:06 AM   CARE MANAGEMENT NOTE 10/26/2012  Patient:  Tony Vasquez,Tony Vasquez   Account Number:  [id number]  Date Initiated:  10/26/2012  Documentation initiated by:  GRAVES-BIGELOW,Takeo Harts  Subjective/Objective Assessment:   Pt admitted for fall and spesis- plan for d/c to SNF today.     Action/Plan:   CSW assisting with needs. No needs from CM at this time.   Anticipated DC Date:  10/26/2012   Anticipated DC Plan:  SKILLED NURSING FACILITY  In-house referral  Clinical Social Worker      DC Planning Services  CM consult      Choice offered to / List presented to:             Status of service:  Completed, signed off Medicare Important Message given?   (If response is "NO", the following Medicare IM given date fields will be blank) Date Medicare IM given:   Date Additional Medicare IM given:    Discharge Disposition:  SKILLED NURSING FACILITY  Per UR Regulation:  Reviewed for med. necessity/level of care/duration of stay  If discussed at Long Length of Stay Meetings, dates discussed:    Comments:

## 2012-10-26 NOTE — Progress Notes (Addendum)
Clinical Social Work Department CLINICAL SOCIAL WORK PLACEMENT NOTE 10/26/2012  Patient:  Tony Vasquez, Tony Vasquez  Account Number:  [id number] Admit date:  10/24/2012  Clinical Social Worker:  Sharol Harness, Theresia Majors  Date/time:  10/26/2012 10:00 AM  Clinical Social Work is seeking post-discharge placement for this patient at the following level of care:   SKILLED NURSING   (*CSW will update this form in Epic as items are completed)   10/25/2012  Patient/family provided with [facility] Department of Clinical Social Work's list of facilities offering this level of care within the geographic area requested by the patient (or if unable, by the patient's family).  10/25/2012  Patient/family informed of their freedom to choose among providers that offer the needed level of care, that participate in Medicare, Medicaid or managed care program needed by the patient, have an available bed and are willing to accept the patient.  10/25/2012  Patient/family informed of [facility]' ownership interest in [facility], as well as of the fact that they are under no obligation to receive care at this facility.  PASARR submitted to EDS on 10/25/2012 PASARR number received from EDS on 10/25/2012  FL2 transmitted to all facilities in geographic area requested by pt/family on  10/26/2012 FL2 transmitted to all facilities within larger geographic area on   Patient informed that his/her managed care company has contracts with or will negotiate with  certain facilities, including the following:     Patient/family informed of bed offers received:  10/26/2012 Patient chooses bed at [facility] Physician recommends and patient chooses bed at    Patient to be transferred to [facility] on  10/26/2012 Patient to be transferred to facility by [facility]   The following physician request were entered in Epic:   Additional Comments:  Tamikia Chowning, LCSWA [phone]

## 2012-10-26 NOTE — Discharge Summary (Signed)
DISCHARGE SUMMARY  Tony Vasquez  MR#: [id number]  DOB:[birth year]  Date of Admission: 10/24/2012 Date of Discharge: 10/26/2012  Attending Physician:Ruta Capece W  Patient's [name],[name], MD    Discharge Diagnoses: Fever, presumed bronchitis Dehydration Acute renal insufficiency due to dehydration, resolved DM2 HTN OA with difficulty with ambulation Tremor, febrile, resolved  Discharge Medications:   Medication List    STOP taking these medications       furosemide 20 MG tablet  Commonly known as:  LASIX      TAKE these medications       amoxicillin-clavulanate 875-125 MG per tablet  Commonly known as:  AUGMENTIN  Take 1 tablet by mouth 2 (two) times daily.     benazepril 10 MG tablet  Commonly known as:  LOTENSIN  Take 10 mg by mouth daily.     glipiZIDE 5 MG tablet  Commonly known as:  GLUCOTROL  Take 5 mg by mouth 2 (two) times daily before a meal.     metFORMIN 500 MG tablet  Commonly known as:  GLUCOPHAGE  Take 500 mg by mouth 2 (two) times daily with a meal.        Hospital Procedures: X-ray Chest Pa And Lateral   10/25/2012   *RADIOLOGY REPORT*  Clinical Data: Fever and shortness of breath  CHEST - 2 VIEW  Comparison:  October 24, 2012  Findings:  There is no edema or consolidation.  Heart size and pulmonary vascularity are normal.  No adenopathy.  There is degenerative change in the thoracic spine.  IMPRESSION: No edema or consolidation.   Original Report Authenticated By: Bretta Bang, M.D.   Dg Chest 2 View  10/24/2012   *RADIOLOGY REPORT*  Clinical Data: Unsteady gait.  Fall.  CHEST - 2 VIEW  Comparison: 05/15/2011  Findings: Shallow inspiration. The heart size and pulmonary vascularity are normal. The lungs appear clear and expanded without focal air space disease or consolidation. No blunting of the costophrenic angles.  No pneumothorax.  Mediastinal contours appear intact.  Degenerative changes in the spine and shoulders.  No  significant changes since the previous study.  IMPRESSION: No evidence of active pulmonary disease.   Original Report Authenticated By: Burman Nieves, M.D.   Ct Head Wo Contrast  10/24/2012   *RADIOLOGY REPORT*  Clinical Data: The patient fell 1 day ago, striking the right temporoparietal area.  Bruising.  Unsteady gait and generalized weakness.  Mild productive cough for several days.  CT HEAD WITHOUT CONTRAST  Technique:  Contiguous axial images were obtained from the base of the skull through the vertex without contrast.  Comparison: 05/14/2011  Findings: Generalized cerebral atrophy.  Ventricular dilatation consistent with central atrophy.  Low attenuation change in the deep white matter consistent with small vessel ischemia.  No significant changes since the previous study.  No mass effect or midline shift.  No abnormal extra-axial fluid collections.  Gray-white matter junctions are distinct.  Basal cisterns are not effaced.  No acute intracranial hemorrhage.  No depressed skull fractures.  Visualized paranasal sinuses and mastoid air cells are not opacified.  Vascular calcifications.  IMPRESSION: No acute intracranial abnormalities.  Chronic atrophy and small vessel ischemic changes appear stable.   Original Report Authenticated By: Burman Nieves, M.D.    History of Present Illness: Patient is a 79 year old male admitted with cough and temp 102F with fall at home  Hospital Course: Tony Vasquez is a longstanding patient of mine at [facility].  His home care is through  his family since he is widowed.  He was admitted after ER eval showing high fever of 102F and a cough, but with negative WBC count , U/A and CXR.  He was hydrated and his renal function is approaching his baseline.  He has regained his appetite over the past 48 hours as well.  Post hydration CXR is also unremarkable as are his blood and urine cultures.  He was initially started on Vancomycin and Rocephin, with the  Vancomycin being d/ced on day 2 with negative blood cultures.  He was seen by PT/OT, with recommendation for SNF, but family has refused this at this time.  He was not a candidate for CIR based on insurance criteria.  The family member who stayed overnight with him the night prior to discharge noted no issues.  Day of Discharge Exam BP 143/71  Pulse 72  Temp(Src) 99.7 F (37.6 C) (Oral)  Resp 18  Ht 5\' 5"  (1.651 m)  Wt 79.379 kg (175 lb)  BMI 29.12 kg/m2  SpO2 98%  Physical Exam: General appearance: alert, cooperative and appears stated age Eyes: no scleral icterus Throat: oropharynx moist without erythema Resp: clear to auscultation bilaterally Cardio: regular rate and rhythm, S1, S2 normal, no murmur, click, rub or gallop GI: soft, non-tender; bowel sounds normal; no masses,  no organomegaly Extremities: no clubbing, cyanosis or edema  Discharge Labs:  Recent Labs  10/24/12 0205 10/24/12 0811 10/25/12 0459  NA 136  --  133*  K 3.6  --  4.0  CL 97  --  100  CO2 24  --  23  GLUCOSE 178*  --  135*  BUN 19  --  14  CREATININE 1.61* 1.58* 1.53*  CALCIUM 9.9  --  8.2*    Recent Labs  10/24/12 0205 10/25/12 0459  AST 14 28  ALT 11 12  ALKPHOS 58 41  BILITOT 0.8 0.8  PROT 7.9 6.3  ALBUMIN 4.1 3.0*    Recent Labs  10/24/12 0205 10/24/12 0811 10/25/12 0459  WBC 7.9 6.5 5.7  NEUTROABS 6.6  --   --   HGB 16.0 15.2 13.9  HCT 44.3 42.9 38.3*  MCV 89.7 89.2 87.4  PLT 144* 130* 122*   Lab Results  Component Value Date   INR 1.03 10/24/2012    Discharge instructions:  Hold Lasix to allow rehydration, encourage good diet.  DME paper prescription given for PT/OT as well as shower seat, 3 in 1 walker and bedside commode as recommended by PT/OT.   Disposition: Home with home PT/OT  Follow-up Appts: Follow-up with Dr. Wylene Simmer at [facility] in 1 week.  Call for appointment.  Condition on Discharge: Improved  Tests Needing Follow-up: BMET in  office next week  Signed: Denna Fryberger W 10/26/2012, 7:15 AM

## 2012-10-27 ENCOUNTER — Non-Acute Institutional Stay (SKILLED_NURSING_FACILITY): Payer: Medicare Other | Admitting: Internal Medicine

## 2012-10-27 DIAGNOSIS — M1990 Unspecified osteoarthritis, unspecified site: Secondary | ICD-10-CM

## 2012-10-27 DIAGNOSIS — I10 Essential (primary) hypertension: Secondary | ICD-10-CM

## 2012-10-27 DIAGNOSIS — J209 Acute bronchitis, unspecified: Secondary | ICD-10-CM

## 2012-10-27 DIAGNOSIS — E119 Type 2 diabetes mellitus without complications: Secondary | ICD-10-CM

## 2012-10-30 ENCOUNTER — Inpatient Hospital Stay (HOSPITAL_COMMUNITY)
Admission: EM | Admit: 2012-10-30 | Discharge: 2012-11-02 | DRG: 547 | Disposition: A | Discharge status: Skilled Nursing Facility | Payer: Medicare Other | Attending: Internal Medicine | Admitting: Internal Medicine

## 2012-10-30 ENCOUNTER — Emergency Department (HOSPITAL_COMMUNITY): Payer: Medicare Other

## 2012-10-30 DIAGNOSIS — R21 Rash and other nonspecific skin eruption: Secondary | ICD-10-CM

## 2012-10-30 DIAGNOSIS — T887XXA Unspecified adverse effect of drug or medicament, initial encounter: Secondary | ICD-10-CM | POA: Diagnosis present

## 2012-10-30 DIAGNOSIS — E860 Dehydration: Secondary | ICD-10-CM | POA: Diagnosis present

## 2012-10-30 DIAGNOSIS — R509 Fever, unspecified: Secondary | ICD-10-CM

## 2012-10-30 DIAGNOSIS — I776 Arteritis, unspecified: Secondary | ICD-10-CM | POA: Diagnosis present

## 2012-10-30 DIAGNOSIS — M310 Hypersensitivity angiitis: Principal | ICD-10-CM | POA: Diagnosis present

## 2012-10-30 DIAGNOSIS — L270 Generalized skin eruption due to drugs and medicaments taken internally: Secondary | ICD-10-CM | POA: Diagnosis present

## 2012-10-30 DIAGNOSIS — Z79899 Other long term (current) drug therapy: Secondary | ICD-10-CM

## 2012-10-30 DIAGNOSIS — N289 Disorder of kidney and ureter, unspecified: Secondary | ICD-10-CM | POA: Diagnosis present

## 2012-10-30 DIAGNOSIS — R066 Hiccough: Secondary | ICD-10-CM | POA: Diagnosis present

## 2012-10-30 DIAGNOSIS — E119 Type 2 diabetes mellitus without complications: Secondary | ICD-10-CM | POA: Diagnosis present

## 2012-10-30 DIAGNOSIS — I10 Essential (primary) hypertension: Secondary | ICD-10-CM | POA: Diagnosis present

## 2012-10-30 DIAGNOSIS — D696 Thrombocytopenia, unspecified: Secondary | ICD-10-CM | POA: Diagnosis present

## 2012-10-30 DIAGNOSIS — E785 Hyperlipidemia, unspecified: Secondary | ICD-10-CM | POA: Diagnosis present

## 2012-10-30 DIAGNOSIS — D649 Anemia, unspecified: Secondary | ICD-10-CM | POA: Diagnosis present

## 2012-10-30 LAB — CULTURE, BLOOD (ROUTINE X 2)

## 2012-10-30 NOTE — ED Notes (Addendum)
Pt was recently discharged from [facility] for bronchitis and fever to [facility]. Started having a fever again today and had labs taken. Las looked overall fairly good. Pt started Augmentin after wen he was sent home but today when he spiked a fever they took him off of this and restarted him on rocephin which he was on before he left [facility]. Rash started today on his lower legs and is now progressing up. Pt's Lasix was stopped while at [facility] and was restarted today. BLE swollen. Pt states that he feels fine. Pt given 650 tylenol at 10:15pm and 350 via EMS PTA.

## 2012-10-30 NOTE — ED Notes (Signed)
Bed: QI69 Expected date: 10/30/12 Expected time: 10:51 PM Means of arrival: Ambulance Comments: Bed 5, EMS, 82 M, Fever/Rash

## 2012-10-31 ENCOUNTER — Encounter (HOSPITAL_COMMUNITY): Payer: Self-pay

## 2012-10-31 DIAGNOSIS — D696 Thrombocytopenia, unspecified: Secondary | ICD-10-CM | POA: Diagnosis present

## 2012-10-31 DIAGNOSIS — E119 Type 2 diabetes mellitus without complications: Secondary | ICD-10-CM | POA: Diagnosis present

## 2012-10-31 DIAGNOSIS — I10 Essential (primary) hypertension: Secondary | ICD-10-CM | POA: Diagnosis present

## 2012-10-31 DIAGNOSIS — R21 Rash and other nonspecific skin eruption: Secondary | ICD-10-CM

## 2012-10-31 DIAGNOSIS — R066 Hiccough: Secondary | ICD-10-CM | POA: Diagnosis present

## 2012-10-31 DIAGNOSIS — N289 Disorder of kidney and ureter, unspecified: Secondary | ICD-10-CM | POA: Diagnosis present

## 2012-10-31 DIAGNOSIS — D649 Anemia, unspecified: Secondary | ICD-10-CM | POA: Diagnosis present

## 2012-10-31 DIAGNOSIS — R509 Fever, unspecified: Secondary | ICD-10-CM

## 2012-10-31 DIAGNOSIS — E785 Hyperlipidemia, unspecified: Secondary | ICD-10-CM | POA: Diagnosis present

## 2012-10-31 LAB — URINALYSIS, ROUTINE W REFLEX MICROSCOPIC
Bilirubin Urine: NEGATIVE
Nitrite: NEGATIVE
Protein, ur: 30 mg/dL — AB
Specific Gravity, Urine: 1.032 — ABNORMAL HIGH (ref 1.005–1.030)
Urobilinogen, UA: 0.2 mg/dL (ref 0.0–1.0)

## 2012-10-31 LAB — CBC WITH DIFFERENTIAL/PLATELET
Basophils Absolute: 0 10*3/uL (ref 0.0–0.1)
Basophils Relative: 0 % (ref 0–1)
Eosinophils Absolute: 0.1 10*3/uL (ref 0.0–0.7)
MCH: 30.5 pg (ref 26.0–34.0)
MCHC: 34.6 g/dL (ref 30.0–36.0)
Monocytes Absolute: 0.2 10*3/uL (ref 0.1–1.0)
Neutro Abs: 4.4 10*3/uL (ref 1.7–7.7)
Neutrophils Relative %: 80 % — ABNORMAL HIGH (ref 43–77)
RDW: 13.6 % (ref 11.5–15.5)

## 2012-10-31 LAB — COMPREHENSIVE METABOLIC PANEL
ALT: 44 U/L (ref 0–53)
Calcium: 8.6 mg/dL (ref 8.4–10.5)
GFR calc Af Amer: 48 mL/min — ABNORMAL LOW (ref >90)
Glucose, Bld: 164 mg/dL — ABNORMAL HIGH (ref 70–99)
Sodium: 134 mEq/L — ABNORMAL LOW (ref 135–145)
Total Protein: 6.2 g/dL (ref 6.0–8.3)

## 2012-10-31 LAB — GLUCOSE, CAPILLARY
Glucose-Capillary: 149 mg/dL — ABNORMAL HIGH (ref 70–99)
Glucose-Capillary: 192 mg/dL — ABNORMAL HIGH (ref 70–99)

## 2012-10-31 LAB — CBC
HCT: 36 % — ABNORMAL LOW (ref 39.0–52.0)
Hemoglobin: 12.5 g/dL — ABNORMAL LOW (ref 13.0–17.0)
MCHC: 34.7 g/dL (ref 30.0–36.0)
RBC: 4.05 MIL/uL — ABNORMAL LOW (ref 4.22–5.81)
WBC: 5.7 10*3/uL (ref 4.0–10.5)

## 2012-10-31 LAB — URINE MICROSCOPIC-ADD ON

## 2012-10-31 MED ORDER — ZOLPIDEM TARTRATE 5 MG PO TABS: 5.0000 mg | ORAL_TABLET | Freq: Every evening | ORAL | Status: DC | PRN

## 2012-10-31 MED ORDER — FUROSEMIDE 20 MG PO TABS
10.0000 mg | ORAL_TABLET | Freq: Every day | ORAL | Status: DC
  Administered 2012-10-31 – 2012-11-02 (×3): 10 mg via ORAL
  Filled 2012-10-31 (×3): qty 0.5

## 2012-10-31 MED ORDER — METOCLOPRAMIDE HCL 10 MG PO TABS
5.0000 mg | ORAL_TABLET | Freq: Four times a day (QID) | ORAL | Status: DC | PRN
  Administered 2012-10-31: 5 mg via ORAL
  Filled 2012-10-31: qty 1

## 2012-10-31 MED ORDER — ONDANSETRON HCL 4 MG PO TABS: 4.0000 mg | ORAL_TABLET | Freq: Four times a day (QID) | ORAL | Status: DC | PRN

## 2012-10-31 MED ORDER — ENOXAPARIN SODIUM 40 MG/0.4ML ~~LOC~~ SOLN
40.0000 mg | SUBCUTANEOUS | Status: DC
  Administered 2012-10-31 – 2012-11-02 (×3): 40 mg via SUBCUTANEOUS
  Filled 2012-10-31 (×3): qty 0.4

## 2012-10-31 MED ORDER — GLIPIZIDE 2.5 MG HALF TABLET
2.5000 mg | ORAL_TABLET | Freq: Two times a day (BID) | ORAL | Status: DC
  Administered 2012-10-31 (×2): 2.5 mg via ORAL
  Filled 2012-10-31 (×5): qty 1

## 2012-10-31 MED ORDER — PIPERACILLIN-TAZOBACTAM 3.375 G IVPB
3.3750 g | Freq: Three times a day (TID) | INTRAVENOUS | Status: DC
  Administered 2012-10-31: 3.375 g via INTRAVENOUS
  Filled 2012-10-31 (×2): qty 50

## 2012-10-31 MED ORDER — SODIUM CHLORIDE 0.9 % IV SOLN
INTRAVENOUS | Status: DC
  Administered 2012-10-31: 04:00:00 via INTRAVENOUS

## 2012-10-31 MED ORDER — PIPERACILLIN-TAZOBACTAM 3.375 G IVPB 30 MIN
3.3750 g | Freq: Once | INTRAVENOUS | Status: AC
  Administered 2012-10-31: 3.375 g via INTRAVENOUS
  Filled 2012-10-31: qty 50

## 2012-10-31 MED ORDER — POLYETHYLENE GLYCOL 3350 17 G PO PACK
17.0000 g | PACK | Freq: Every day | ORAL | Status: DC | PRN
  Filled 2012-10-31: qty 1

## 2012-10-31 MED ORDER — SODIUM CHLORIDE 0.9 % IV BOLUS (SEPSIS)
500.0000 mL | Freq: Once | INTRAVENOUS | Status: AC
  Administered 2012-10-31: 500 mL via INTRAVENOUS

## 2012-10-31 MED ORDER — VANCOMYCIN HCL IN DEXTROSE 1-5 GM/200ML-% IV SOLN
1000.0000 mg | Freq: Once | INTRAVENOUS | Status: DC
  Filled 2012-10-31: qty 200

## 2012-10-31 MED ORDER — ONDANSETRON HCL 4 MG/2ML IJ SOLN: 4.0000 mg | Freq: Four times a day (QID) | INTRAMUSCULAR | Status: DC | PRN

## 2012-10-31 MED ORDER — VANCOMYCIN HCL IN DEXTROSE 1-5 GM/200ML-% IV SOLN
1000.0000 mg | INTRAVENOUS | Status: DC
  Administered 2012-10-31: 1000 mg via INTRAVENOUS
  Filled 2012-10-31: qty 200

## 2012-10-31 MED ORDER — INSULIN ASPART 100 UNIT/ML ~~LOC~~ SOLN
0.0000 [IU] | Freq: Three times a day (TID) | SUBCUTANEOUS | Status: DC
  Administered 2012-10-31 (×2): 1 [IU] via SUBCUTANEOUS
  Administered 2012-10-31: 2 [IU] via SUBCUTANEOUS
  Administered 2012-11-01: 3 [IU] via SUBCUTANEOUS
  Administered 2012-11-01: 2 [IU] via SUBCUTANEOUS
  Administered 2012-11-01: 1 [IU] via SUBCUTANEOUS

## 2012-10-31 MED ORDER — BACLOFEN 5 MG HALF TABLET
5.0000 mg | ORAL_TABLET | Freq: Three times a day (TID) | ORAL | Status: DC | PRN
  Administered 2012-10-31 – 2012-11-01 (×2): 5 mg via ORAL
  Filled 2012-10-31 (×2): qty 1

## 2012-10-31 MED ORDER — BENAZEPRIL HCL 10 MG PO TABS
10.0000 mg | ORAL_TABLET | Freq: Every day | ORAL | Status: DC
  Administered 2012-10-31 – 2012-11-02 (×3): 10 mg via ORAL
  Filled 2012-10-31 (×3): qty 1

## 2012-10-31 MED ORDER — ACETAMINOPHEN 325 MG PO TABS: 650.0000 mg | ORAL_TABLET | Freq: Once | ORAL | Status: DC

## 2012-10-31 MED ORDER — ACETAMINOPHEN 650 MG RE SUPP: 650.0000 mg | Freq: Four times a day (QID) | RECTAL | Status: DC | PRN

## 2012-10-31 MED ORDER — ACETAMINOPHEN 325 MG PO TABS: 650.0000 mg | ORAL_TABLET | Freq: Four times a day (QID) | ORAL | Status: DC | PRN

## 2012-10-31 MED ORDER — INFLUENZA VAC SPLIT QUAD 0.5 ML IM SUSP: 0.5000 mL | INTRAMUSCULAR | Status: DC

## 2012-10-31 NOTE — Progress Notes (Addendum)
ANTIBIOTIC CONSULT NOTE - INITIAL  Pharmacy Consult for Vancomycin & Zosyn Indication: Empiric Treatment of Febrile Illness of Unknown Source  Allergies  Allergen Reactions  . Shrimp [Shellfish Allergy] Anaphylaxis    Patient Measurements: Height: 5\' 5"  (165.1 cm) Weight: 164 lb 14.4 oz (74.798 kg) IBW/kg (Calculated) : 61.5  Vital Signs: Temp: 98.6 F (37 C) (09/15 0437) Temp src: Oral (09/15 0437) BP: 129/76 mmHg (09/15 0437) Pulse Rate: 84 (09/15 0437) Intake/Output from previous day:   Intake/Output from this shift:    Labs:  Recent Labs  10/31/12 0045  WBC 5.5  HGB 12.5*  PLT 122*   Estimated Creatinine Clearance: 38.2 ml/min (by C-G formula based on Cr of 1.53). No results found for this basename: VANCOTROUGH, VANCOPEAK, VANCORANDOM, GENTTROUGH, GENTPEAK, GENTRANDOM, TOBRATROUGH, TOBRAPEAK, TOBRARND, AMIKACINPEAK, AMIKACINTROU, AMIKACIN,  in the last 72 hours   Microbiology: Recent Results (from the past 720 hour(s))  CULTURE, BLOOD (ROUTINE X 2)     Status: None   Collection Time    10/24/12  2:05 AM      Result Value Range Status   Specimen Description BLOOD RIGHT ARM   Final   Special Requests BOTTLES DRAWN AEROBIC AND ANAEROBIC 5CC EA   Final   Culture  Setup Time     Final   Value: 10/24/2012 10:57     Performed at [facility]   Culture     Final   Value: NO GROWTH 5 DAYS     Performed at [facility]   Report Status 10/30/2012 FINAL   Final  CULTURE, BLOOD (ROUTINE X 2)     Status: None   Collection Time    10/24/12  2:40 AM      Result Value Range Status   Specimen Description BLOOD RIGHT HAND   Final   Special Requests BOTTLES DRAWN AEROBIC ONLY 8CC   Final   Culture  Setup Time     Final   Value: 10/24/2012 10:57     Performed at [facility]   Culture     Final   Value: NO GROWTH 5 DAYS     Performed at [facility]   Report Status 10/30/2012 FINAL   Final  URINE CULTURE     Status: None   Collection  Time    10/24/12  2:57 AM      Result Value Range Status   Specimen Description URINE, RANDOM   Final   Special Requests NONE   Final   Culture  Setup Time     Final   Value: 10/24/2012 11:03     Performed at [facility] Count     Final   Value: 10,000 COLONIES/ML     Performed at [facility]   Culture     Final   Value: Multiple bacterial morphotypes present, none predominant. Suggest appropriate recollection if clinically indicated.     Performed at [facility]   Report Status 10/25/2012 FINAL   Final    Medical History: Past Medical History  Diagnosis Date  . Diabetes mellitus   . Hypertension   . Shortness of breath     Medications:  Scheduled:  . benazepril  10 mg Oral Daily  . enoxaparin (LOVENOX) injection  40 mg Subcutaneous Q24H  . furosemide  10 mg Oral Daily  . [START ON 11/01/2012] influenza vac split quadrivalent PF  0.5 mL Intramuscular Tomorrow-1000   Infusions:  . sodium chloride 75 mL/hr  at 10/31/12 0340   Assessment:  79 yr male with recent hospitalization for bronchitis presents with fever and rash (discharged home on 9/10 with Augmentin until today). Spiked fever and pt received Rocephin IM dose @ SNF.  Tmax = 102.33F; Tc = AF;  WBC = 5.5; Scr = 1.53 (on 9/19)  Received Zosyn 3.375gm x 1 in ED @ 03:40.  Blood cultures x 2 ordered  Vancomycin and Zosyn ordered empirically for febrile illness of unknown source  Will initiate dosing based on Scr obtained on 9/10.  BMet to be drawn this morning.  Will follow up today's Scr and modify dosages if necessary.  Goal of Therapy:  Vancomycin trough level 15-20 mcg/ml  Plan:  Measure antibiotic drug levels at steady state Follow up culture results Zosyn 3.375gm IV q8h (each dose infused over 4 hrs) Vancomycin 1000mg  IV q24h  Sarita Hakanson, Joselyn Glassman, PharmD 10/31/2012,4:54 AM  ADDENDUM:  Scr = 1.54; CrCl remains 38 ml/min  Continue Zosyn and Vancomycin regimens as  previously ordered.  Terrilee Files, PharmD 10/31/12 @ 06:00

## 2012-10-31 NOTE — ED Provider Notes (Signed)
CSN: [id number]     Arrival date & time 10/30/12  2259 History   First MD Initiated Contact with Patient 10/30/12 2351     Chief Complaint  Patient presents with  . Fever  . Rash   (Consider location/radiation/quality/duration/timing/severity/associated sxs/prior Treatment) Patient is a 79 y.o. male presenting with fever and rash. The history is provided by medical records and the EMS personnel. No language interpreter was used.  Fever Temp source:  Oral Severity:  Moderate Onset quality:  Gradual Timing:  Constant Progression:  Worsening Chronicity:  Recurrent Relieved by:  Nothing Worsened by:  Nothing tried Ineffective treatments:  None tried Associated symptoms: rash   Associated symptoms: no confusion, no dysuria and no sore throat   Risk factors: no recent travel   Rash Associated symptoms: fever   Associated symptoms: no sore throat     Past Medical History  Diagnosis Date  . Diabetes mellitus   . Hypertension   . Shortness of breath    Past Surgical History  Procedure Laterality Date  . Hernia repair     No family history on file. History  Substance Use Topics  . Smoking status: Never Smoker   . Smokeless tobacco: Never Used  . Alcohol Use: No    Review of Systems  Constitutional: Positive for fever.  HENT: Negative for sore throat, facial swelling, neck pain and neck stiffness.   Genitourinary: Negative for dysuria.  Skin: Positive for rash.  Psychiatric/Behavioral: Negative for confusion.  All other systems reviewed and are negative.    Allergies  Shrimp  Home Medications   Current Outpatient Rx  Name  Route  Sig  Dispense  Refill  . acetaminophen (TYLENOL) 325 MG tablet   Oral   Take 325 mg by mouth every 6 (six) hours as needed for pain (fever,pain).         Marland Kitchen amoxicillin-clavulanate (AUGMENTIN) 875-125 MG per tablet   Oral   Take 1 tablet by mouth 2 (two) times daily.   14 tablet   0   . benazepril (LOTENSIN) 10 MG tablet    Oral   Take 10 mg by mouth daily.         . cefTRIAXone (ROCEPHIN) 1 G injection   Intramuscular   Inject 1 g into the muscle daily.         . furosemide (LASIX) 20 MG tablet   Oral   Take 10 mg by mouth daily. Take 1/2 tab of 20mg  =10mg          . glipiZIDE (GLUCOTROL) 5 MG tablet   Oral   Take 5 mg by mouth 2 (two) times daily before a meal.         . metFORMIN (GLUCOPHAGE) 500 MG tablet   Oral   Take 500 mg by mouth 2 (two) times daily with a meal.         . metoCLOPramide (REGLAN) 5 MG tablet   Oral   Take 5 mg by mouth every 6 (six) hours as needed (hiccup).          BP 118/52  Pulse 107  Temp(Src) 102.2 F (39 C) (Rectal)  Resp 20  SpO2 97% Physical Exam  Constitutional: He appears well-developed and well-nourished. No distress.  HENT:  Head: Normocephalic and atraumatic.  Mouth/Throat: Oropharynx is clear and moist.  Eyes: Conjunctivae are normal. Pupils are equal, round, and reactive to light.  Neck: Normal range of motion. Neck supple.  Cardiovascular: Normal rate, regular rhythm and intact distal  pulses.   Pulmonary/Chest: Effort normal and breath sounds normal. No respiratory distress. He has no rales.  Abdominal: Soft. Bowel sounds are normal. There is no tenderness. There is no rebound and no guarding.  Musculoskeletal: Normal range of motion. He exhibits edema. He exhibits no tenderness.  Lymphadenopathy:    He has no cervical adenopathy.  Neurological: He is alert. He has normal reflexes.  Skin: Skin is warm and dry. Rash noted.  Psychiatric: He has a normal mood and affect.    ED Course  Procedures (including critical care time) Labs Review Labs Reviewed  CBC WITH DIFFERENTIAL  URINALYSIS, ROUTINE W REFLEX MICROSCOPIC   Imaging Review No results found.  MDM  No diagnosis found. Will cover with vanc zosyn and admit to medicine    Heavin Sebree K Kavaughn Faucett-Rasch, MD 10/31/12 [phone]

## 2012-10-31 NOTE — Evaluation (Signed)
Occupational Therapy Evaluation Patient Details Name: Tony Vasquez MRN: [id number] DOB: [birth year] Today's Date: 10/31/2012 Time: 3086-5784 OT Time Calculation (min): 18 min  OT Assessment / Plan / Recommendation History of present illness Pt admitted to [facility] with fever and rash on his legs   Clinical Impression   Pt presents with generalized weakness and decreased I with ADL activity . Pt will benefit from skilled OT to increase I with ADL activity and return to PLOF of I with ADL activity    OT Assessment  Patient needs continued OT Services       Barriers to Discharge Decreased caregiver support          Frequency  Min 2X/week    Precautions / Restrictions Precautions Precautions: Fall       ADL  Eating/Feeding: Minimal assistance;Supervision/safety Where Assessed - Eating/Feeding: Chair Grooming: Moderate assistance;Performed Where Assessed - Grooming: Supported sitting Upper Body Bathing: Moderate assistance;Simulated Where Assessed - Upper Body Bathing: Supported sitting Lower Body Bathing: Maximal assistance;Simulated Where Assessed - Lower Body Bathing: Supported sit to stand Upper Body Dressing: Simulated Where Assessed - Upper Body Dressing: Unsupported sitting Lower Body Dressing: Maximal assistance;Performed Where Assessed - Lower Body Dressing: Supported sit to stand Toilet Transfer: Maximal assistance;Simulated Toilet Transfer Method: Surveyor, minerals: Other (comment) (to recliner) Transfers/Ambulation Related to ADLs: +2 for safety. Max A sit - stand and stand pivot    OT Diagnosis: Generalized weakness;Cognitive deficits;Altered mental status  OT Problem List: Decreased strength;Decreased activity tolerance;Impaired balance (sitting and/or standing);Decreased coordination;Decreased cognition;Decreased safety awareness;Decreased knowledge of use of DME or AE;Decreased knowledge of precautions OT Treatment Interventions:  Self-care/ADL training;Therapeutic exercise;DME and/or AE instruction;Therapeutic activities;Patient/family education;Balance training   OT Goals(Current goals can be found in the care plan section) Acute Rehab OT Goals Patient Stated Goal: Go home OT Goal Formulation: With patient Time For Goal Achievement: 11/14/12 Potential to Achieve Goals: Good ADL Goals Pt Will Perform Grooming: with supervision;standing Pt Will Perform Upper Body Bathing: with supervision;sitting Pt Will Perform Lower Body Bathing: with supervision;sit to/from stand Pt Will Transfer to Toilet: with supervision;regular height toilet;ambulating  Visit Information  Last OT Received On: 10/31/12 History of Present Illness: Pt admitted to [facility] with fever and rash on his legs       Prior Functioning     Home Living Family/patient expects to be discharged to:: Other (Comment) (pt plans on going back to [facility] to get stronger )         Vision/Perception Vision - History Baseline Vision: Wears glasses only for reading Vision - Assessment Eye Alignment: Within Functional Limits   Cognition  Cognition Arousal/Alertness: Awake/alert Behavior During Therapy: WFL for tasks assessed/performed Overall Cognitive Status: Within Functional Limits for tasks assessed    Extremity/Trunk Assessment Upper Extremity Assessment Upper Extremity Assessment: Generalized weakness Lower Extremity Assessment Lower Extremity Assessment: Generalized weakness     Mobility Bed Mobility Bed Mobility: Supine to Sit;Sitting - Scoot to Edge of Bed Supine to Sit: 2: Max assist Sitting - Scoot to [place] of Bed: 2: Max assist Details for Bed Mobility Assistance: Assist to bring legs off and trunk up. Transfers Transfers: Sit to Stand;Stand to Sit Sit to Stand: 3: Mod assist;With upper extremity assist;From bed;From chair/3-in-1;With armrests Stand to Sit: With upper extremity assist;With armrests;To chair/3-in-1;3: Mod  assist Details for Transfer Assistance: Assist to bring hips up.  Multiple cues to initiate sitting when standing in front of chair.     Exercise     Balance  Balance Balance Assessed: Yes Static Sitting Balance Static Sitting - Balance Support: Bilateral upper extremity supported Static Sitting - Level of Assistance: 3: Mod assist (posterior lean; R bias) Static Standing Balance Static Standing - Balance Support: Bilateral upper extremity supported Static Standing - Level of Assistance: 3: Mod assist   End of Session OT - End of Session Equipment Utilized During Treatment: Gait belt Activity Tolerance: Patient tolerated treatment well Patient left: in chair;with call bell/phone within reach;with family/visitor present Nurse Communication: Mobility status  GO     Alba Cory 10/31/2012, 2:10 PM

## 2012-10-31 NOTE — Progress Notes (Signed)
Clinical Social Work Department BRIEF PSYCHOSOCIAL ASSESSMENT 10/31/2012  Patient:  Tony Vasquez,Tony Vasquez     Account Number:  [id number]     Admit date:  10/30/2012  Clinical Social Worker:  Dennison Bulla  Date/Time:  10/31/2012 10:15 AM  Referred by:  Physician  Date Referred:  10/31/2012 Referred for  SNF Placement   Other Referral:   Interview type:  Patient Other interview type:    PSYCHOSOCIAL DATA Living Status:  FACILITY Admitted from facility:  [facility] Level of care:  Skilled Nursing Facility Primary support name:  Tony Vasquez Primary support relationship to patient:  CHILD, ADULT Degree of support available:   Strong    CURRENT CONCERNS Current Concerns  Post-Acute Placement   Other Concerns:    SOCIAL WORK ASSESSMENT / PLAN CSW received [facility] referral due to patient being admitted from [facility] facility. CSW reviewed chart and met with patient and two dtrs at bedside. CSW introduced myself and explained role.    Patient reports he recently DC from [facility] and went to [facility] for rehab. Patient reports he has only been there [facility] few days but desires to return at DC. Patient and family aware and agreeable to plan back to [facility] once medically stable. CSW explained that CSW will keep in contact with SNF and assist with transfer back to facility.    CSW spoke with SNF who is agreeable to accept once medically stable. CSW completed FL2 and placed on shadow chart for MD to sign. CSW will continue to follow.   Assessment/plan status:  Psychosocial Support/Ongoing Assessment of Needs Other assessment/ plan:   Information/referral to community resources:   Will return to SNF    PATIENT'S/FAMILY'S RESPONSE TO PLAN OF CARE: Patient alert and oriented. Patient and dtrs involved in assessment and agreeable to return to SNF at DC. Patient has CSW contact information if needed and agreeable to family involvement.

## 2012-10-31 NOTE — Consult Note (Signed)
[facility]    Date of Admission:  10/30/2012   Total days of antibiotics 7        Day 2 vancomycin        Day 2 piperacillin tazobactam               Reason for Consult: persistent fever    Referring Physician: Dr. Luan Moore  Principal Problem:   Fever Active Problems:   Rash   Intractable hiccups   Renal insufficiency, mild   Thrombocytopenia   Normocytic anemia   DM (diabetes mellitus)   HTN (hypertension)   Dyslipidemia   . benazepril  10 mg Oral Daily  . enoxaparin (LOVENOX) injection  40 mg Subcutaneous Q24H  . furosemide  10 mg Oral Daily  . glipiZIDE  2.5 mg Oral BID AC  . insulin aspart  0-9 Units Subcutaneous TID WC  . piperacillin-tazobactam (ZOSYN)  IV  3.375 g Intravenous Q8H  . vancomycin  1,000 mg Intravenous Q24H    Recommendations: 1. Discontinue vancomycin and piperacillin tazobactam and observe off of antibiotics for now   Assessment: He has persistent fevers and a new rash compatible with leukocytoclastic vasculitis. I do not see any obvious source for active infection at this time. The fever and rash are likely to be secondary to an allergic reaction to his recent beta-lactam antibiotics. I favor stopping all antibiotics now. I will followup tomorrow   HPI: Tony Vasquez is a 79 y.o. male who was admitted to the hospital on September 8 with recent onset of fever. His physical exam at that time was unremarkable. He had no evidence of pneumonia or urinary tract infection. He was treated empirically for possible bronchitis and discharged on September 10 on amoxicillin clavulanate. He went to [facility] skilled nursing facility. He developed a rash on his lower extremities and his antibiotic was changed to ceftriaxone. The fever and rash persisted leading to his readmission today. He says that his also been bothered by persistent hiccups over the past 3 weeks.   Review of Systems: Constitutional: positive for  fevers, negative for anorexia, chills, sweats and weight loss Eyes: negative Ears, nose, mouth, throat, and face: negative Respiratory: negative Cardiovascular: negative Gastrointestinal: negative for abdominal pain, constipation, diarrhea, nausea, reflux symptoms and vomiting Genitourinary:negative Integument/breast: positive for rash  Past Medical History  Diagnosis Date  . Diabetes mellitus   . Hypertension   . Shortness of breath     History  Substance Use Topics  . Smoking status: Never Smoker   . Smokeless tobacco: Never Used  . Alcohol Use: No    History reviewed. No pertinent family history. Allergies  Allergen Reactions  . Shrimp [Shellfish Allergy] Anaphylaxis    OBJECTIVE: Blood pressure 128/66, pulse 86, temperature 98.9 F (37.2 C), temperature source Oral, resp. rate 20, height 5\' 5"  (1.651 m), weight 74.798 kg (164 lb 14.4 oz), SpO2 98.00%. General: he is sitting up in a chair or. He has frequent hiccups Skin: he has a petechial rash over both feet and lower legs worse on the left than on the right Oral: Edentulous. No oropharyngeal lesions Eyes: External exam normal Neck: Supple Head: No abnormalities noted with palpation of the temporal or jugular arteries Lungs: clear Cor: regular S1 and S2 no murmurs Abdomen: healed left groin incision. Soft and nontender with no palpable masses Neuro: Alert with normal speech and conversation Joints and extremities: No acute abnormalities  Microbiology:  Recent Results (from the past 240 hour(s))  CULTURE, BLOOD (ROUTINE X 2)     Status: None   Collection Time    10/24/12  2:05 AM      Result Value Range Status   Specimen Description BLOOD RIGHT ARM   Final   Special Requests BOTTLES DRAWN AEROBIC AND ANAEROBIC 5CC EA   Final   Culture  Setup Time     Final   Value: 10/24/2012 10:57     Performed at [facility]   Culture     Final   Value: NO GROWTH 5 DAYS     Performed at [facility]    Report Status 10/30/2012 FINAL   Final  CULTURE, BLOOD (ROUTINE X 2)     Status: None   Collection Time    10/24/12  2:40 AM      Result Value Range Status   Specimen Description BLOOD RIGHT HAND   Final   Special Requests BOTTLES DRAWN AEROBIC ONLY 8CC   Final   Culture  Setup Time     Final   Value: 10/24/2012 10:57     Performed at [facility]   Culture     Final   Value: NO GROWTH 5 DAYS     Performed at [facility]   Report Status 10/30/2012 FINAL   Final  URINE CULTURE     Status: None   Collection Time    10/24/12  2:57 AM      Result Value Range Status   Specimen Description URINE, RANDOM   Final   Special Requests NONE   Final   Culture  Setup Time     Final   Value: 10/24/2012 11:03     Performed at [facility] Count     Final   Value: 10,000 COLONIES/ML     Performed at [facility]   Culture     Final   Value: Multiple bacterial morphotypes present, none predominant. Suggest appropriate recollection if clinically indicated.     Performed at [facility]   Report Status 10/25/2012 FINAL   Final  MRSA PCR SCREENING     Status: Abnormal   Collection Time    10/31/12  5:20 AM      Result Value Range Status   MRSA by PCR INVALID RESULTS, SPECIMEN SENT FOR CULTURE (*) NEGATIVE Final   Comment: Albesa Seen RN AT [phone] ON 09.15.14 BY SHUEA                The GeneXpert MRSA Assay (FDA     approved for NASAL specimens     only), is one component of a     comprehensive MRSA colonization     surveillance program. It is not     intended to diagnose MRSA     infection nor to guide or     monitor treatment for     MRSA infections.    Cliffton Asters, MD [facility] [phone] pager   [phone] cell 10/31/2012, 3:17 PM

## 2012-10-31 NOTE — H&P (Signed)
PCP:   Gaspar Garbe, MD   Chief Complaint:  Fever, rash  HPI: Tony Vasquez is a 79 year old male admitted last week at [facility] for fevers.  He had a workup including urine, blood cultures and CXR, all of which were normal.  Complained of cough and was given Augmentin for presumed bronchitis as no other source of fever was identified.  His WBC count remained normal as well.  He was going to be sent home, but family decided they wanted him at [facility], so he was sent there on Wednesday.    He returned to the ER late Sunday night with fever of 102 and a rash on his legs.  His Augmentin had been changed to Rocephin at some point at the facility, possibly because of a "drug rash."  Presently, he looks no different than he did on Wednesday and complains of hiccups, which family said that he has had for a week, but I did not see him hiccup during his stay last week at [facility].  He has a slight sore throat but no other issues and states that he feels ok.  Admitted for FUO workup.  He has no pets, uses city water, no animal bites or tick bites and no sick family members.  Review of Systems:  Review of Systems - As noted above, only cough, hiccups and rash, otherwise negative x 12 points. Past Medical History: Past Medical History  Diagnosis Date  . Diabetes mellitus   . Hypertension   . Shortness of breath    Past Surgical History  Procedure Laterality Date  . Hernia repair      Medications: Prior to Admission medications   Medication Sig Start Date End Date Taking? Authorizing Provider  acetaminophen (TYLENOL) 325 MG tablet Take 325 mg by mouth every 6 (six) hours as needed for pain (fever,pain).   Yes Historical Provider, MD  amoxicillin-clavulanate (AUGMENTIN) 875-125 MG per tablet Take 1 tablet by mouth 2 (two) times daily. 10/26/12  Yes Gaspar Garbe, MD  benazepril (LOTENSIN) 10 MG tablet Take 10 mg by mouth daily. 09/21/12  Yes Historical Provider, MD  cefTRIAXone (ROCEPHIN) 1 G  injection Inject 1 g into the muscle daily. 10/29/12 11/05/12 Yes Historical Provider, MD  furosemide (LASIX) 20 MG tablet Take 10 mg by mouth daily. Take 1/2 tab of 20mg  =10mg    Yes Historical Provider, MD  glipiZIDE (GLUCOTROL) 5 MG tablet Take 5 mg by mouth 2 (two) times daily before a meal.   Yes Historical Provider, MD  metFORMIN (GLUCOPHAGE) 500 MG tablet Take 500 mg by mouth 2 (two) times daily with a meal.   Yes Historical Provider, MD  metoCLOPramide (REGLAN) 5 MG tablet Take 5 mg by mouth every 6 (six) hours as needed (hiccup).   Yes Historical Provider, MD    Allergies:   Allergies  Allergen Reactions  . Shrimp [Shellfish Allergy] Anaphylaxis    Social History:  reports that he has never smoked. He has never used smokeless tobacco. He reports that he does not drink alcohol or use illicit drugs.  Family History: History reviewed. No pertinent family history.  Physical Exam: Filed Vitals:   10/31/12 0342 10/31/12 0432 10/31/12 0437 10/31/12 0631  BP: 128/80  129/76 146/76  Pulse: 83  84 81  Temp:   98.6 F (37 C) 100 F (37.8 C)  TempSrc:   Oral Oral  Resp: 24  22 20   Height:  5' 4.96" (1.65 m) 5\' 5"  (1.651 m)  Weight:  79.4 kg (175 lb 0.7 oz) 74.798 kg (164 lb 14.4 oz)   SpO2: 98%  98% 99%   General appearance: alert, cooperative and appears stated age Head: Normocephalic, without obvious abnormality, atraumatic Eyes: conjunctivae/corneas clear. PERRL, EOM's intact.  Nose: Nares normal. Septum midline. Mucosa normal. No drainage or sinus tenderness. Throat: lips, mucosa, and tongue normal; teeth and gums normal Neck: no adenopathy, no carotid bruit, no JVD and thyroid not enlarged, symmetric, no tenderness/mass/nodules Resp: clear to auscultation bilaterally Cardio: regular rate and rhythm, S1, S2 normal, no murmur, click, rub or gallop GI: soft, non-tender; bowel sounds normal; no masses,  no organomegaly Extremities: slight edema/trace B LE's, rash on shins is  punctate at upper margins and confluent at lower. Pulses: 2+ and symmetric Lymph nodes: Cervical adenopathy: no cervical lymphadenopathy Neurologic: Alert and oriented X 3, normal strength and tone. Normal symmetric reflexes.     Labs on Admission:   Recent Labs  10/31/12 0518  NA 134*  K 4.2  CL 101  CO2 24  GLUCOSE 164*  BUN 20  CREATININE 1.54*  CALCIUM 8.6    Recent Labs  10/31/12 0518  AST 31  ALT 44  ALKPHOS 56  BILITOT 0.5  PROT 6.2  ALBUMIN 2.4*   No results found for this basename: LIPASE, AMYLASE,  in the last 72 hours  Recent Labs  10/31/12 0045 10/31/12 0518  WBC 5.5 5.7  NEUTROABS 4.4  --   HGB 12.5* 12.5*  HCT 36.1* 36.0*  MCV 88.0 88.9  PLT 122* 121*   No results found for this basename: CKTOTAL, CKMB, CKMBINDEX, TROPONINI,  in the last 72 hours Lab Results  Component Value Date   INR 1.03 10/24/2012   No results found for this basename: TSH, T4TOTAL, FREET3, T3FREE, THYROIDAB,  in the last 72 hours No results found for this basename: VITAMINB12, FOLATE, FERRITIN, TIBC, IRON, RETICCTPCT,  in the last 72 hours  Radiological Exams on Admission: Dg Chest 2 View  10/31/2012   CLINICAL DATA:  Fever. Lower extremity rash  EXAM: CHEST  2 VIEW  COMPARISON:  10/25/2012  FINDINGS: The heart size and mediastinal contours are within normal limits. Both lungs are clear. No effusion or pneumothorax. Diffuse degenerative thoracic spurring.  IMPRESSION: No active cardiopulmonary disease.   Electronically Signed   By: Tiburcio Pea   On: 10/31/2012 00:03   X-ray Chest Pa And Lateral   10/25/2012   *RADIOLOGY REPORT*  Clinical Data: Fever and shortness of breath  CHEST - 2 VIEW  Comparison:  October 24, 2012  Findings:  There is no edema or consolidation.  Heart size and pulmonary vascularity are normal.  No adenopathy.  There is degenerative change in the thoracic spine.  IMPRESSION: No edema or consolidation.   Original Report Authenticated By: Bretta Bang, M.D.   Dg Chest 2 View  10/24/2012   *RADIOLOGY REPORT*  Clinical Data: Unsteady gait.  Fall.  CHEST - 2 VIEW  Comparison: 05/15/2011  Findings: Shallow inspiration. The heart size and pulmonary vascularity are normal. The lungs appear clear and expanded without focal air space disease or consolidation. No blunting of the costophrenic angles.  No pneumothorax.  Mediastinal contours appear intact.  Degenerative changes in the spine and shoulders.  No significant changes since the previous study.  IMPRESSION: No evidence of active pulmonary disease.   Original Report Authenticated By: Burman Nieves, M.D.   Ct Head Wo Contrast  10/24/2012   *RADIOLOGY REPORT*  Clinical Data:  The patient fell 1 day ago, striking the right temporoparietal area.  Bruising.  Unsteady gait and generalized weakness.  Mild productive cough for several days.  CT HEAD WITHOUT CONTRAST  Technique:  Contiguous axial images were obtained from the base of the skull through the vertex without contrast.  Comparison: 05/14/2011  Findings: Generalized cerebral atrophy.  Ventricular dilatation consistent with central atrophy.  Low attenuation change in the deep white matter consistent with small vessel ischemia.  No significant changes since the previous study.  No mass effect or midline shift.  No abnormal extra-axial fluid collections.  Gray-white matter junctions are distinct.  Basal cisterns are not effaced.  No acute intracranial hemorrhage.  No depressed skull fractures.  Visualized paranasal sinuses and mastoid air cells are not opacified.  Vascular calcifications.  IMPRESSION: No acute intracranial abnormalities.  Chronic atrophy and small vessel ischemic changes appear stable.   Original Report Authenticated By: Burman Nieves, M.D.   Orders placed during the hospital encounter of 10/24/12  . EKG    Assessment/Plan Fevers- Had empiric abx last week but still issues with fever.  His labs, CXR, are all unremarkable as was  his workup and exam are unremarkable except for his new rash, sore throat and hiccups.  Will have the ID team see to help with workup and continue to follow culture results. DM2-SSI and continue orals.  Eating better than last week. HTN- Back on his ACE, and have resumed low dose Lasix due to edema Dehydration- Resolved, IV medlock, Cr is at his baseline. PT/OT to continue as was getting at [facility] late last week, due to weakness.      Elnoria Livingston W 10/31/2012, 7:39 AM

## 2012-11-01 LAB — CBC
HCT: 35.2 % — ABNORMAL LOW (ref 39.0–52.0)
MCH: 29.6 pg (ref 26.0–34.0)
MCV: 87.6 fL (ref 78.0–100.0)
RDW: 13.3 % (ref 11.5–15.5)
WBC: 5.6 10*3/uL (ref 4.0–10.5)

## 2012-11-01 LAB — COMPREHENSIVE METABOLIC PANEL
AST: 31 U/L (ref 0–37)
Albumin: 2.4 g/dL — ABNORMAL LOW (ref 3.5–5.2)
Alkaline Phosphatase: 52 U/L (ref 39–117)
BUN: 21 mg/dL (ref 6–23)
Chloride: 102 mEq/L (ref 96–112)
Potassium: 3.8 mEq/L (ref 3.5–5.1)
Sodium: 135 mEq/L (ref 135–145)
Total Bilirubin: 0.4 mg/dL (ref 0.3–1.2)
Total Protein: 6.2 g/dL (ref 6.0–8.3)

## 2012-11-01 LAB — GLUCOSE, CAPILLARY
Glucose-Capillary: 224 mg/dL — ABNORMAL HIGH (ref 70–99)
Glucose-Capillary: 236 mg/dL — ABNORMAL HIGH (ref 70–99)

## 2012-11-01 MED ORDER — GLIPIZIDE 5 MG PO TABS
5.0000 mg | ORAL_TABLET | Freq: Two times a day (BID) | ORAL | Status: DC
  Administered 2012-11-01 – 2012-11-02 (×3): 5 mg via ORAL
  Filled 2012-11-01 (×5): qty 1

## 2012-11-01 NOTE — Progress Notes (Signed)
Subjective: Feels ok, hiccups gone.  Was seen by ID, thought to be vasculitis instead of infection, likely drug reaction.  Discussed with patient and daughter in room today.  He remains weak.  Objective: Vital signs in last 24 hours: Temp:  [98.9 F (37.2 C)-99.7 F (37.6 C)] 99.1 F (37.3 C) (09/16 0604) Pulse Rate:  [86-88] 88 (09/16 0604) Resp:  [20] 20 (09/16 0604) BP: (128-151)/(66-80) 144/80 mmHg (09/16 0604) SpO2:  [96 %-98 %] 96 % (09/16 0604) Weight change:  Last BM Date: 10/31/12  Intake/Output from previous day: 09/15 0701 - 09/16 0700 In: 240 [P.O.:240] Out: 702 [Urine:701; Stool:1] Intake/Output this shift:   General appearance: cooperative, appears stated age and fatigued  Resp: clear to auscultation bilaterally  Cardio: regular rate and rhythm, S1, S2 normal, no murmur, click, rub or gallop  GI: soft, non-tender; bowel sounds normal; no masses, no organomegaly  Extremities: rash on legs fading, punctate. Neurologic: Alert and oriented, x 3  Lab Results:  Recent Labs  10/31/12 0518 11/01/12 0445  WBC 5.7 5.6  HGB 12.5* 11.9*  HCT 36.0* 35.2*  PLT 121* 174   BMET  Recent Labs  10/31/12 0518 11/01/12 0445  NA 134* 135  K 4.2 3.8  CL 101 102  CO2 24 24  GLUCOSE 164* 199*  BUN 20 21  CREATININE 1.54* 1.57*  CALCIUM 8.6 8.7    Studies/Results: Dg Chest 2 View  10/31/2012   CLINICAL DATA:  Fever. Lower extremity rash  EXAM: CHEST  2 VIEW  COMPARISON:  10/25/2012  FINDINGS: The heart size and mediastinal contours are within normal limits. Both lungs are clear. No effusion or pneumothorax. Diffuse degenerative thoracic spurring.  IMPRESSION: No active cardiopulmonary disease.   Electronically Signed   By: Tiburcio Pea   On: 10/31/2012 00:03    Medications:  I have reviewed the patient's current medications. Scheduled: . benazepril  10 mg Oral Daily  . enoxaparin (LOVENOX) injection  40 mg Subcutaneous Q24H  . furosemide  10 mg Oral Daily  .  glipiZIDE  5 mg Oral BID AC  . insulin aspart  0-9 Units Subcutaneous TID WC   Continuous: . sodium chloride 10 mL/hr at 10/31/12 0845   ZOX:[id number], acetaminophen, baclofen, metoCLOPramide, ondansetron (ZOFRAN) IV, ondansetron, polyethylene glycol, zolpidem  Assessment/Plan: Fevers- Had empiric abx last week.  ID believes this is drug reaction (leukocytoclastic vasculitis).  Off abx, will observe fever curve.  Again, negative cultures/WBC/CXR.  Steroids if progresses, but seems to be getting better on its won right now. DM2-SSI and continue orals, increased his Glipizide back to prior outpatient dose.  Metformin still held.. Eating better than last week.  HTN- Back on his ACE, and have resumed low dose Lasix due to edema  Dehydration- Resolved, IV medlock, Cr is at his baseline.  PT/OT to continue as was getting at [facility] late last week, due to weakness.   Dispo- To [facility] tomorrow if remains afebrile.   LOS: 2 days   Charlottie Peragine W 11/01/2012, 7:27 AM

## 2012-11-01 NOTE — Progress Notes (Signed)
Patient ID: Tony Vasquez, male   DOB: 11/13/1932, 79 y.o.   MRN: [id number]         [facility]    Date of Admission:  10/30/2012     Principal Problem:   Fever Active Problems:   Rash   Intractable hiccups   Renal insufficiency, mild   Thrombocytopenia   Normocytic anemia   DM (diabetes mellitus)   HTN (hypertension)   Dyslipidemia   . benazepril  10 mg Oral Daily  . enoxaparin (LOVENOX) injection  40 mg Subcutaneous Q24H  . furosemide  10 mg Oral Daily  . glipiZIDE  5 mg Oral BID AC  . insulin aspart  0-9 Units Subcutaneous TID WC    Subjective: He is feeling much better today. He has not had any more hiccups. He bothered by any itching.  Objective: Temp:  [98.3 F (36.8 C)-99.7 F (37.6 C)] 98.3 F (36.8 C) (09/16 1421) Pulse Rate:  [84-88] 84 (09/16 1421) Resp:  [18-20] 18 (09/16 1421) BP: (116-151)/(66-80) 116/66 mmHg (09/16 1421) SpO2:  [96 %-97 %] 97 % (09/16 1421)  General: he looks much better. He is alert and smiling, visiting with family Skin: fading petechial rash on lower extremities Lungs: clear Cor: regular S1 and S2 no murmurs Abdomen: soft and nontender   Lab Results Lab Results  Component Value Date   WBC 5.6 11/01/2012   HGB 11.9* 11/01/2012   HCT 35.2* 11/01/2012   MCV 87.6 11/01/2012   PLT 174 11/01/2012    Lab Results  Component Value Date   CREATININE 1.57* 11/01/2012   BUN 21 11/01/2012   NA 135 11/01/2012   K 3.8 11/01/2012   CL 102 11/01/2012   CO2 24 11/01/2012    Lab Results  Component Value Date   ALT 40 11/01/2012   AST 31 11/01/2012   ALKPHOS 52 11/01/2012   BILITOT 0.4 11/01/2012      Microbiology: Recent Results (from the past 240 hour(s))  CULTURE, BLOOD (ROUTINE X 2)     Status: None   Collection Time    10/24/12  2:05 AM      Result Value Range Status   Specimen Description BLOOD RIGHT ARM   Final   Special Requests BOTTLES DRAWN AEROBIC AND ANAEROBIC 5CC EA   Final   Culture  Setup  Time     Final   Value: 10/24/2012 10:57     Performed at [facility]   Culture     Final   Value: NO GROWTH 5 DAYS     Performed at [facility]   Report Status 10/30/2012 FINAL   Final  CULTURE, BLOOD (ROUTINE X 2)     Status: None   Collection Time    10/24/12  2:40 AM      Result Value Range Status   Specimen Description BLOOD RIGHT HAND   Final   Special Requests BOTTLES DRAWN AEROBIC ONLY 8CC   Final   Culture  Setup Time     Final   Value: 10/24/2012 10:57     Performed at [facility]   Culture     Final   Value: NO GROWTH 5 DAYS     Performed at [facility]   Report Status 10/30/2012 FINAL   Final  URINE CULTURE     Status: None   Collection Time    10/24/12  2:57 AM      Result Value Range Status  Specimen Description URINE, RANDOM   Final   Special Requests NONE   Final   Culture  Setup Time     Final   Value: 10/24/2012 11:03     Performed at [facility] Count     Final   Value: 10,000 COLONIES/ML     Performed at [facility]   Culture     Final   Value: Multiple bacterial morphotypes present, none predominant. Suggest appropriate recollection if clinically indicated.     Performed at [facility]   Report Status 10/25/2012 FINAL   Final  CULTURE, BLOOD (ROUTINE X 2)     Status: None   Collection Time    10/31/12 12:45 AM      Result Value Range Status   Specimen Description BLOOD LEFT ANTECUBITAL   Final   Special Requests BOTTLES DRAWN AEROBIC AND ANAEROBIC 3CC   Final   Culture  Setup Time     Final   Value: 10/31/2012 09:44     Performed at [facility]   Culture     Final   Value:        BLOOD CULTURE RECEIVED NO GROWTH TO DATE CULTURE WILL BE HELD FOR 5 DAYS BEFORE ISSUING A FINAL NEGATIVE REPORT     Performed at [facility]   Report Status PENDING   Incomplete  CULTURE, BLOOD (ROUTINE X 2)     Status: None   Collection Time    10/31/12 12:57 AM      Result  Value Range Status   Specimen Description BLOOD RIGHT ANTECUBITAL   Final   Special Requests BOTTLES DRAWN AEROBIC AND ANAEROBIC 5CC   Final   Culture  Setup Time     Final   Value: 10/31/2012 09:44     Performed at [facility]   Culture     Final   Value:        BLOOD CULTURE RECEIVED NO GROWTH TO DATE CULTURE WILL BE HELD FOR 5 DAYS BEFORE ISSUING A FINAL NEGATIVE REPORT     Performed at [facility]   Report Status PENDING   Incomplete  MRSA PCR SCREENING     Status: Abnormal   Collection Time    10/31/12  5:20 AM      Result Value Range Status   MRSA by PCR INVALID RESULTS, SPECIMEN SENT FOR CULTURE (*) NEGATIVE Final   Comment: Albesa Seen RN AT [phone] ON 09.15.14 BY SHUEA                The GeneXpert MRSA Assay (FDA     approved for NASAL specimens     only), is one component of a     comprehensive MRSA colonization     surveillance program. It is not     intended to diagnose MRSA     infection nor to guide or     monitor treatment for     MRSA infections.  MRSA CULTURE     Status: None   Collection Time    10/31/12  5:20 AM      Result Value Range Status   Specimen Description NOSE   Final   Special Requests NONE   Final   Culture     Final   Value: NO SUSPICIOUS COLONIES, CONTINUING TO HOLD     Performed at [facility]   Report Status PENDING   Incomplete   Assessment: I suspect that he  had a beta-lactam hypersensitivity reaction. He is now afebrile and his rash is resolving. He reaction may have been triggered by Augmentin but I cannot rule out the possibility that after he was switched to Rocephin there was a cross reaction that worsen the hypersensitivity reaction.  Plan: 1. Observe off antibiotics 2. I have listed the possibility of beta-lactam allergy in Epic 3. I will sign off now but please call if I can be of further assistance  Cliffton Asters, MD [facility] for Infectious Disease [facility] [phone]  pager   [phone] cell 11/01/2012, 4:40 PM

## 2012-11-01 NOTE — Evaluation (Signed)
Physical Therapy Evaluation Patient Details Name: Tony Vasquez MRN: [id number] DOB: [birth year] Today's Date: 11/01/2012 Time: 7829-5621 PT Time Calculation (min): 18 min  PT Assessment / Plan / Recommendation History of Present Illness  Pt admitted to [facility] with fever and rash on his legs  Clinical Impression  Pt ambulated x 350 ft , requires cues for safe use of RW, Pt will benefit from PT while in acute care to improve safety, and functional mobility. Plans for SNF.    PT Assessment  Patient needs continued PT services    Follow Up Recommendations  SNF    Does the patient have the potential to tolerate intense rehabilitation      Barriers to Discharge        Equipment Recommendations  Rolling walker with 5" wheels    Recommendations for Other Services     Frequency Min 3X/week    Precautions / Restrictions Precautions Precautions: Fall   Pertinent Vitals/Pain none      Mobility  Transfers Sit to Stand: 4: Min assist;From chair/3-in-1;With armrests Stand to Sit: To chair/3-in-1;With upper extremity assist;With armrests Details for Transfer Assistance: verbal cues for use Of UE's during sit/stand  Ambulation/Gait Ambulation/Gait Assistance: 4: Min assist Ambulation Distance (Feet): 350 Feet Ambulation/Gait Assistance Details: pt tends to push RW like a lawn Mower-"i do mow my grass", multimodal cues for position inside of RW. Gait Pattern: Step-to pattern;Decreased step length - right;Decreased step length - left;Shuffle;Trunk flexed Gait velocity: very slow    Exercises     PT Diagnosis: Difficulty walking;Generalized weakness;Altered mental status  PT Problem List: Decreased strength;Decreased activity tolerance;Decreased balance;Decreased mobility;Decreased knowledge of use of DME;Decreased cognition;Decreased knowledge of precautions PT Treatment Interventions: DME instruction;Gait training;Functional mobility training;Therapeutic activities;Therapeutic  exercise;Patient/family education;Balance training     PT Goals(Current goals can be found in the care plan section) Acute Rehab PT Goals Patient Stated Goal: Go home PT Goal Formulation: With patient/family Time For Goal Achievement: 11/15/12 Potential to Achieve Goals: Good  Visit Information  Last PT Received On: 11/01/12 Assistance Needed: +1 History of Present Illness: Pt admitted to [facility] with fever and rash on his legs       Prior Functioning  Home Living Family/patient expects to be discharged to:: Skilled nursing facility Living Arrangements: Other (Comment);Other relatives Available Help at Discharge: Family;Available PRN/intermittently Type of Home: House Entrance Stairs-Number of Steps: 1 Home Layout: One level Home Equipment: Cane - single point Additional Comments: was at [place] several days.    Cognition  Cognition Arousal/Alertness: Awake/alert Behavior During Therapy: WFL for tasks assessed/performed Overall Cognitive Status: Within Functional Limits for tasks assessed Area of Impairment: Problem solving Following Commands: Follows one step commands with increased time    Extremity/Trunk Assessment Lower Extremity Assessment Lower Extremity Assessment: Overall WFL for tasks assessed   Balance Static Sitting Balance Static Sitting - Balance Support: Bilateral upper extremity supported Static Sitting - Level of Assistance: 4: Min assist  End of Session PT - End of Session Equipment Utilized During Treatment: Gait belt Activity Tolerance: Patient tolerated treatment well Patient left: in chair;with call bell/phone within reach;with family/visitor present  GP     Rada Hay 11/01/2012, 3:20 PM Blanchard Kelch PT [phone]

## 2012-11-01 NOTE — Progress Notes (Signed)
Clinical Social Work  Per progression meeting, patient should DC tomorrow if medically stable. CSW spoke with [facility] who is agreeable to accept when medically stable. CSW will continue to follow.  [place], [place] [phone]

## 2012-11-02 LAB — GLUCOSE, CAPILLARY
Glucose-Capillary: 182 mg/dL — ABNORMAL HIGH (ref 70–99)
Glucose-Capillary: 146 mg/dL — ABNORMAL HIGH (ref 70–99)
Glucose-Capillary: 275 mg/dL — ABNORMAL HIGH (ref 70–99)

## 2012-11-02 LAB — COMPREHENSIVE METABOLIC PANEL
AST: 56 U/L — ABNORMAL HIGH (ref 0–37)
Albumin: 2.4 g/dL — ABNORMAL LOW (ref 3.5–5.2)
BUN: 21 mg/dL (ref 6–23)
Creatinine, Ser: 1.4 mg/dL — ABNORMAL HIGH (ref 0.50–1.35)
Potassium: 3.8 mEq/L (ref 3.5–5.1)
Total Protein: 6.6 g/dL (ref 6.0–8.3)

## 2012-11-02 LAB — MRSA CULTURE

## 2012-11-02 LAB — CBC
HCT: 36.1 % — ABNORMAL LOW (ref 39.0–52.0)
MCV: 87.8 fL (ref 78.0–100.0)
RBC: 4.11 MIL/uL — ABNORMAL LOW (ref 4.22–5.81)
WBC: 6.7 10*3/uL (ref 4.0–10.5)

## 2012-11-02 MED ORDER — INSULIN ASPART 100 UNIT/ML ~~LOC~~ SOLN: 0.0000 [IU] | Freq: Three times a day (TID) | SUBCUTANEOUS | Status: DC

## 2012-11-02 NOTE — Progress Notes (Signed)
Clinical Social Work  CSW faxed DC summary to [facility] who is agreeable to admission around 11am. CSW informed patient and dtr of DC plans and patient prefers [facility] for transportation because dtr does not feel comfortable transporting patient. CSW explained that [facility] could be set up but no guarantee of payment through insurance. Patient and family aware and remains agreeable. RN aware of plans and will call report to SNF. CSW prepared DC packet with FL2 included. CSW coordinated transportation via [place]. Request # [phone]. CSW is signing off but available if needed.  [place], [place] [phone]

## 2012-11-02 NOTE — Discharge Summary (Signed)
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 Active Problems:   Rash, vasculitic   Intractable hiccups, resolved   Renal insufficiency, mild, Cr 1.4-1.6   Thrombocytopenia   Normocytic anemia   DM (diabetes mellitus) type 2 controlled on orals   HTN (hypertension)   Dyslipidemia   Discharge Medications:   Medication List    STOP taking these medications       amoxicillin-clavulanate 875-125 MG per tablet  Commonly known as:  AUGMENTIN     ROCEPHIN 1 G injection  Generic drug:  cefTRIAXone      TAKE these medications       acetaminophen 325 MG tablet  Commonly known as:  TYLENOL  Take 325 mg by mouth every 6 (six) hours as needed for pain (fever,pain).     benazepril 10 MG tablet  Commonly known as:  LOTENSIN  Take 10 mg by mouth daily.     furosemide 20 MG tablet  Commonly known as:  LASIX  Take 10 mg by mouth daily. Take 1/2 tab of 20mg  =10mg      glipiZIDE 5 MG tablet  Commonly known as:  GLUCOTROL  Take 5 mg by mouth 2 (two) times daily before a meal.     insulin aspart 100 UNIT/ML injection  Commonly known as:  novoLOG  Inject 0-9 Units into the skin 3 (three) times daily with meals.     metFORMIN 500 MG tablet  Commonly known as:  GLUCOPHAGE  Take 500 mg by mouth 2 (two) times daily with a meal.     metoCLOPramide 5 MG tablet  Commonly known as:  REGLAN  Take 5 mg by mouth every 6 (six) hours as needed (hiccup).        Hospital Procedures: Dg Chest 2 View  10/31/2012   CLINICAL DATA:  Fever. Lower extremity rash  EXAM: CHEST  2 VIEW  COMPARISON:  10/25/2012  FINDINGS: The heart size and mediastinal contours are within normal  limits. Both lungs are clear. No effusion or pneumothorax. Diffuse degenerative thoracic spurring.  IMPRESSION: No active cardiopulmonary disease.   Electronically Signed   By: Tiburcio Pea   On: 10/31/2012 00:03   X-ray Chest Pa And Lateral   10/25/2012   *RADIOLOGY REPORT*  Clinical Data: Fever and shortness of breath  CHEST - 2 VIEW  Comparison:  October 24, 2012  Findings:  There is no edema or consolidation.  Heart size and pulmonary vascularity are normal.  No adenopathy.  There is degenerative change in the thoracic spine.  IMPRESSION: No edema or consolidation.   Original Report Authenticated By: Bretta Bang, M.D.   Dg Chest 2 View  10/24/2012   *RADIOLOGY REPORT*  Clinical Data: Unsteady gait.  Fall.  CHEST - 2 VIEW  Comparison: 05/15/2011  Findings: Shallow inspiration. The heart size and pulmonary vascularity are normal. The lungs appear clear and expanded without focal air space disease or consolidation. No blunting of the costophrenic angles.  No pneumothorax.  Mediastinal contours appear intact.  Degenerative changes in the spine and shoulders.  No significant changes since the previous study.  IMPRESSION: No evidence of active pulmonary disease.   Original Report Authenticated By: Burman Nieves, M.D.   Ct Head Wo Contrast  10/24/2012   *  RADIOLOGY REPORT*  Clinical Data: The patient fell 1 day ago, striking the right temporoparietal area.  Bruising.  Unsteady gait and generalized weakness.  Mild productive cough for several days.  CT HEAD WITHOUT CONTRAST  Technique:  Contiguous axial images were obtained from the base of the skull through the vertex without contrast.  Comparison: 05/14/2011  Findings: Generalized cerebral atrophy.  Ventricular dilatation consistent with central atrophy.  Low attenuation change in the deep white matter consistent with small vessel ischemia.  No significant changes since the previous study.  No mass effect or midline shift.  No abnormal extra-axial  fluid collections.  Gray-white matter junctions are distinct.  Basal cisterns are not effaced.  No acute intracranial hemorrhage.  No depressed skull fractures.  Visualized paranasal sinuses and mastoid air cells are not opacified.  Vascular calcifications.  IMPRESSION: No acute intracranial abnormalities.  Chronic atrophy and small vessel ischemic changes appear stable.   Original Report Authenticated By: Burman Nieves, M.D.    History of Present Illness: Patient is a 79 year old male hospitalized prior for presumed bronchitis, sent to [facility], readmitted with fever and rash.  Hospital Course: Tony Vasquez was transferred to the ER from [facility] due to fevers and rash.  Place on empiric Vancomycin/Zosyn despite normal labs.  Prior BC x 2 negative and CXR remained unremarkable.  He had been switched from Augmentin to Rocephin for some reason at the facility.  The rash was present on legs and not particularly pruritic.  Had an episode of hiccups as well.  Was seen by ID and thought that this was not an infection but a vasculitic reaction to beta lactam antibiotics.  Cultures and labs remained negative and fever dissipated x 48 hours.  Patient is being sent back to [facility] to resume his rehab as prior.  Day of Discharge Exam BP 161/89  Pulse 74  Temp(Src) 99.2 F (37.3 C) (Oral)  Resp 18  Ht 5\' 5"  (1.651 m)  Wt 71.396 kg (157 lb 6.4 oz)  BMI 26.19 kg/m2  SpO2 94%  Physical Exam: General appearance: alert, cooperative and appears stated age Eyes: no scleral icterus Throat: oropharynx moist without erythema Resp: clear to auscultation bilaterally Cardio: regular rate and rhythm, S1, S2 normal, no murmur, click, rub or gallop GI: soft, non-tender; bowel sounds normal; no masses,  no organomegaly Extremities: no clubbing, cyanosis or edema, has resolving vasculitis rash on B LE's  Discharge Labs:  Recent Labs  11/01/12 0445 11/02/12 0420  NA 135 134*  K 3.8 3.8  CL 102 102   CO2 24 24  GLUCOSE 199* 155*  BUN 21 21  CREATININE 1.57* 1.40*  CALCIUM 8.7 8.8    Recent Labs  11/01/12 0445 11/02/12 0420  AST 31 56*  ALT 40 58*  ALKPHOS 52 56  BILITOT 0.4 0.4  PROT 6.2 6.6  ALBUMIN 2.4* 2.4*    Recent Labs  10/31/12 0045  11/01/12 0445 11/02/12 0420  WBC 5.5  < > 5.6 6.7  NEUTROABS 4.4  --   --   --   HGB 12.5*  < > 11.9* 12.5*  HCT 36.1*  < > 35.2* 36.1*  MCV 88.0  < > 87.6 87.8  PLT 122*  < > 174 212  < > = values in this interval not displayed. Lab Results  Component Value Date   INR 1.03 10/24/2012    Discharge instructions: Resume PT/OT from prior admission to facility   Disposition: [facility] Follow-up Appts:  Follow-up with Dr. Wylene Simmer at [facility] i1 week after discharge from [facility]  Call for appointment.  Condition on Discharge: Stable  Signed: Tarence Searcy W 11/02/2012, 7:52 AM

## 2012-11-02 NOTE — Progress Notes (Signed)
Patient discharged per MD order. Report called to Marisue Ivan at [facility]. Patient's IV removed, patient dressed, and condom cath kept in place per pt request.  [facility] arrived for transport back to facility. Angelena Form, RN

## 2012-11-03 ENCOUNTER — Non-Acute Institutional Stay (SKILLED_NURSING_FACILITY): Payer: Medicare Other | Admitting: Adult Health

## 2012-11-03 DIAGNOSIS — I10 Essential (primary) hypertension: Secondary | ICD-10-CM

## 2012-11-03 DIAGNOSIS — R509 Fever, unspecified: Secondary | ICD-10-CM

## 2012-11-03 DIAGNOSIS — E119 Type 2 diabetes mellitus without complications: Secondary | ICD-10-CM

## 2012-11-03 DIAGNOSIS — N289 Disorder of kidney and ureter, unspecified: Secondary | ICD-10-CM

## 2012-11-03 NOTE — Progress Notes (Signed)
Patient ID: Tony Vasquez, male   DOB: [birth year], 79 y.o.   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 Rocephin and Augmentin were discontinued. He has been admitted for a short-term rehabilitation.  REASSESSMENT OF ONGOING PROBLEM(S):  HTN: Pt 's HTN remains stable.  Denies CP, sob, DOE, pedal edema, headaches, dizziness or visual disturbances.  No complications from the medications currently being used.  Last BP : 138/79  DM:pt's DM remains stable.  Pt denies polyuria, polydipsia, polyphagia, changes in vision or hypoglycemic episodes.  No complications noted from the medication presently being used.     PAST MEDICAL HISTORY : Reviewed.  No changes.  CURRENT MEDICATIONS: Reviewed per [facility]  REVIEW OF SYSTEMS:  GENERAL: no change in appetite, no fatigue, no weight changes, no fever, chills or weakness RESPIRATORY: no cough, SOB, DOE, wheezing, hemoptysis CARDIAC: no chest pain, edema or palpitations GI: no abdominal pain, diarrhea, constipation, heart burn, nausea or vomiting  PHYSICAL EXAMINATION  VS:  T98       P99      RR20      BP138/79     POX97 %     WT will 165.6 (Lb)  GENERAL: no acute distress, normal body habitus EYES: conjunctivae normal, sclerae normal, normal eye lids NECK: supple, trachea midline, no neck masses, no thyroid tenderness, no thyromegaly LYMPHATICS: no LAN in the neck, no supraclavicular LAN RESPIRATORY: breathing is even & unlabored, BS CTAB CARDIAC: RRR, no murmur,no extra heart sounds, no edema GI: abdomen soft, normal BS, no masses, no tenderness, no  hepatomegaly, no splenomegaly PSYCHIATRIC: the patient is alert & oriented to person, affect & behavior appropriate  LABS/RADIOLOGY: 11/02/12 sodium 134 potassium 3.8 glucose 155 BUN 21 creatinine 1.40 calcium 8.8 AST 56 ALT 58 alkaline phosphatase 56 bilirubin total 0.4 protein 6.6 albumin 2.4 WBC 6.7 hemoglobin 12.5 hematocrit 36.1  ASSESSMENT/PLAN:   Fever - Resolved  Hypertension - well-controlled  Diabetes Mellitus, type 2 - well-controlled  Renal Insufficiency, mild - stable    CPT CODE: 08657

## 2012-11-06 LAB — CULTURE, BLOOD (ROUTINE X 2): Culture: NO GROWTH

## 2012-11-09 ENCOUNTER — Non-Acute Institutional Stay (SKILLED_NURSING_FACILITY): Payer: Medicare Other | Admitting: Internal Medicine

## 2012-11-09 DIAGNOSIS — I150 Renovascular hypertension: Secondary | ICD-10-CM

## 2012-11-09 DIAGNOSIS — D696 Thrombocytopenia, unspecified: Secondary | ICD-10-CM

## 2012-11-09 DIAGNOSIS — N189 Chronic kidney disease, unspecified: Secondary | ICD-10-CM

## 2012-11-09 DIAGNOSIS — E1129 Type 2 diabetes mellitus with other diabetic kidney complication: Secondary | ICD-10-CM

## 2012-11-16 ENCOUNTER — Non-Acute Institutional Stay (SKILLED_NURSING_FACILITY): Payer: Medicare Other | Admitting: Adult Health

## 2012-11-16 DIAGNOSIS — I10 Essential (primary) hypertension: Secondary | ICD-10-CM

## 2012-11-16 DIAGNOSIS — E119 Type 2 diabetes mellitus without complications: Secondary | ICD-10-CM

## 2012-11-16 DIAGNOSIS — N289 Disorder of kidney and ureter, unspecified: Secondary | ICD-10-CM

## 2012-11-23 NOTE — Progress Notes (Signed)
Patient ID: Tony Vasquez, male   DOB: September 22, 1932, 79 y.o.   MRN: [id number]        HISTORY & PHYSICAL  DATE: 10/27/2012   FACILITY: [facility]  LEVEL OF CARE: SNF (31)  ALLERGIES:  Allergies  Allergen Reactions  . Shrimp [Shellfish Allergy] Anaphylaxis  . 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 Was difficult to determine if it was do to Augmentin or both beta lactam agents.    CHIEF COMPLAINT:  Manage diabetes mellitus, hypertension, and osteoarthritis.    HISTORY OF PRESENT ILLNESS:  The patient is a 79 year-old, African-American male who was hospitalized secondary to dehydration and fever.  After hospitalization, he is admitted to this facility for short-term rehabilitation.   He has the following problems:    DM:pt's DM remains stable.  Pt denies polyuria, polydipsia, polyphagia, changes in vision or hypoglycemic episodes.  No complications noted from the medication presently being used.  Last hemoglobin A1c is:  Not available.    HTN: Pt 's HTN remains stable.  Denies CP, sob, DOE, pedal edema, headaches, dizziness or visual disturbances.  No complications from the medications currently being used.  Last BP :   162/91, 143/71.    ARTHRITIS: Patient's arthritis remains stable.  The patient denies ongoing joint pains, stiffness, swelling, warmth & redness.  No complications reported from the medication(s) currently being used.    PAST MEDICAL HISTORY :  Past Medical History  Diagnosis Date  . Diabetes mellitus   . Hypertension   . Shortness of breath     PAST SURGICAL HISTORY: Past Surgical History  Procedure Laterality Date  . Hernia repair      SOCIAL HISTORY:  reports that he has never smoked. He has never used smokeless tobacco. He reports that he does not drink alcohol or use illicit  drugs.  FAMILY HISTORY: None  CURRENT MEDICATIONS: Reviewed per [facility]  REVIEW OF SYSTEMS:  See HPI otherwise 14 point ROS is negative.  PHYSICAL EXAMINATION  VS:  T 98.8       P 77      RR 19      BP 162/91      POX 96%        WT (Lb)  GENERAL: no acute distress, normal body habitus EYES: conjunctivae normal, sclerae normal, normal eye lids MOUTH/THROAT: lips without lesions,no lesions in the mouth,tongue is without lesions,uvula elevates in midline NECK: supple, trachea midline, no neck masses, no thyroid tenderness, no thyromegaly LYMPHATICS: no LAN in the neck, no supraclavicular LAN RESPIRATORY: breathing is even & unlabored, BS CTAB CARDIAC: RRR, no murmur,no extra heart sounds, no edema GI:  ABDOMEN: abdomen soft, normal BS, no masses, no tenderness  LIVER/SPLEEN: no hepatomegaly, no splenomegaly MUSCULOSKELETAL: HEAD: normal to inspection & palpation BACK: no kyphosis, scoliosis or spinal processes tenderness EXTREMITIES: LEFT UPPER EXTREMITY: full range of motion, normal strength & tone RIGHT UPPER EXTREMITY:  full range of motion, normal strength & tone LEFT LOWER EXTREMITY: strength intact, range of motion moderate   RIGHT LOWER EXTREMITY: strength intact, range of motion moderate   PSYCHIATRIC: the patient is alert & oriented to person, affect & behavior appropriate  LABS/RADIOLOGY: Chest x-ray:  No acute findings.      CT of the head:  No acute findings.    Glucose 135, creatinine 1.53, otherwise BMP normal.  Urinalysis negative.    Albumin 3, otherwise liver profile normal.    Platelets 122, otherwise CBC normal.    ASSESSMENT/PLAN:  Diabetes mellitus.  Check hemoglobin A1c.    Hypertension.  Blood pressure borderline.  We will monitor.    Osteoarthritis.  Denies ongoing pain.    Acute bronchitis.  Continue Augmentin as prescribed.      Check CBC and BMP.    Renal insufficiency.  Reassess.    I have reviewed patient's medical records received at  admission/from hospitalization.  CPT CODE: 09811

## 2012-11-23 NOTE — Progress Notes (Signed)
Patient ID: Tony Vasquez, male   DOB: July 24, 1932, 79 y.o.   MRN: [id number]       PROGRESS NOTE  DATE: 11/16/2012   FACILITY: [facility]  LEVEL OF CARE: SNF (31)  Acute Visit  CHIEF COMPLAINT:  Discharge Notes  HISTORY OF PRESENT ILLNESS: This is a 79 year old male who is for discharge home with Home health PT, OT and Nursing. DME: Rolling walker due to unsteady gait. 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 Rocephin and Augmentin were discontinued. Patient was admitted to this facility for short-term rehabilitation after the patient's recent hospitalization.  Patient has completed SNF rehabilitation and therapy has cleared the patient for discharge.  Reassessment of ongoing problem(s):  DM:pt's DM remains stable.  Pt denies polyuria, polydipsia, polyphagia, changes in vision or hypoglycemic episodes.  No complications noted from the medication presently being used.   cbg logs - 185, 105, 121, 144  HTN: Pt 's HTN remains stable.  Denies CP, sob, DOE, pedal edema, headaches, dizziness or visual disturbances.  No complications from the medications currently being used.  Last BP :114/61   PAST MEDICAL HISTORY : Reviewed.  No changes.  CURRENT MEDICATIONS: Reviewed per [facility]  REVIEW OF SYSTEMS:  GENERAL: no change in appetite, no fatigue, no weight changes, no fever, chills or weakness RESPIRATORY: no cough, SOB, DOE, wheezing, hemoptysis CARDIAC: no chest pain, edema or palpitations GI: no abdominal pain, diarrhea, constipation, heart burn, nausea or vomiting  PHYSICAL EXAMINATION  VS:  T97       P70       RR20      BP114/61      POX95 %       WT161.2 (Lb)  GENERAL: no acute distress, normal body habitus EYES: conjunctivae normal, sclerae normal, normal eye lids NECK: supple, trachea midline, no neck masses, no thyroid  tenderness, no thyromegaly LYMPHATICS: no LAN in the neck, no supraclavicular LAN RESPIRATORY: breathing is even & unlabored, BS CTAB CARDIAC: RRR, no murmur,no extra heart sounds, no edema GI: abdomen soft, normal BS, no masses, no tenderness, no hepatomegaly, no splenomegaly PSYCHIATRIC: the patient is alert & oriented to person, affect & behavior appropriate  LABS/RADIOLOGY: 11/02/12 sodium 134 potassium 3.8 glucose 155 BUN 21 creatinine 1.40 calcium 8.8 AST 56 ALT 58 alkaline phosphatase 56 bilirubin total 0.4 protein 6.6 albumin 2.4 WBC 6.7 hemoglobin 12.5 hematocrit 36.1    ASSESSMENT/PLAN:  Hypertension - well-controlled; continue Lisinopril 10 mg 1 tab PO Q D  Diabetes Mellitus, type 2 - well-controlled; continue Glucotrol 5mg  1 tab PO BID, Humalog 5 units SQ TIDac and HS for CBG > 150 and Metformin 500 mg 1 tab PO BID  Renal Insufficiency, mild - stable    I have filled out patient's discharge paperwork and written prescriptions.  Patient will receive home health PT, OT and Nursing. DME provided: Rolling walker  Total discharge time: Greater than 30 minutes Discharge time involved coordination of the discharge process with Child psychotherapist, nursing staff and therapy department. Medical justification for home health services/DME verified.  CPT CODE: 40981

## 2012-11-24 DIAGNOSIS — M1990 Unspecified osteoarthritis, unspecified site: Secondary | ICD-10-CM | POA: Insufficient documentation

## 2012-11-24 DIAGNOSIS — J209 Acute bronchitis, unspecified: Secondary | ICD-10-CM | POA: Insufficient documentation

## 2012-12-12 DIAGNOSIS — I150 Renovascular hypertension: Secondary | ICD-10-CM | POA: Insufficient documentation

## 2012-12-12 DIAGNOSIS — E1129 Type 2 diabetes mellitus with other diabetic kidney complication: Secondary | ICD-10-CM | POA: Insufficient documentation

## 2012-12-12 DIAGNOSIS — N189 Chronic kidney disease, unspecified: Secondary | ICD-10-CM | POA: Insufficient documentation

## 2012-12-12 NOTE — Progress Notes (Signed)
Patient ID: INRI SOBIESKI, male   DOB: Jan 02, 1933, 79 y.o.   MRN: [id number]        HISTORY & PHYSICAL  DATE: 11/09/2012   FACILITY: [facility]  LEVEL OF CARE: SNF (31)  ALLERGIES:  Allergies  Allergen Reactions  . Shrimp [Shellfish Allergy] Anaphylaxis  . 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 Was difficult to determine if it was do to Augmentin or both beta lactam agents.    CHIEF COMPLAINT:  Manage diabetes mellitus, hypertension, and chronic kidney disease.    HISTORY OF PRESENT ILLNESS:  The patient is a 79 year-old, African-American male who was hospitalized secondary to leukocytic vasculitis from ______.   After hospitalization, he is readmitted to this facility for short-term rehabilitation.   He has the following problems:    CHRONIC KIDNEY DISEASE: The patient's chronic kidney disease remains stable.  Patient denies increasing lower extremity swelling or confusion. Last BUN and creatinine are:   On 10/30/2012:  BUN 20, creatinine 1.58.    DM:pt's DM remains stable.  Pt denies polyuria, polydipsia, polyphagia, changes in vision or hypoglycemic episodes.  No complications noted from the medication presently being used.  Last hemoglobin A1c is:  Not available.    HTN: Pt 's HTN remains stable.  Denies CP, sob, DOE, pedal edema, headaches, dizziness or visual disturbances.  No complications from the medications currently being used.  Last BP :  128/67.    PAST MEDICAL HISTORY :  Past Medical History  Diagnosis Date  . Diabetes mellitus   . Hypertension   . Shortness of breath     PAST SURGICAL HISTORY: Past Surgical History  Procedure Laterality Date  . Hernia repair      SOCIAL HISTORY:  reports that he has never smoked. He has never used smokeless tobacco. He reports that he does not drink  alcohol or use illicit drugs.  FAMILY HISTORY: None  CURRENT MEDICATIONS: Reviewed per MAR  REVIEW OF SYSTEMS:  See HPI otherwise 14 point ROS is negative.  PHYSICAL EXAMINATION  VS:  T 97.7       P 74      RR 20      BP 128/67      POX 95%        WT (Lb)  GENERAL: no acute distress, normal body habitus EYES: conjunctivae normal, sclerae normal, normal eye lids MOUTH/THROAT: lips without lesions,no lesions in the mouth,tongue is without lesions,uvula elevates in midline NECK: supple, trachea midline, no neck masses, no thyroid tenderness, no thyromegaly LYMPHATICS: no LAN in the neck, no supraclavicular LAN RESPIRATORY: breathing is even & unlabored, BS CTAB CARDIAC: RRR, no murmur,no extra heart sounds, no edema GI:  ABDOMEN: abdomen soft, normal BS, no masses, no tenderness  LIVER/SPLEEN: no hepatomegaly, no splenomegaly MUSCULOSKELETAL: HEAD: normal to inspection & palpation BACK: no kyphosis, scoliosis or spinal processes tenderness EXTREMITIES: LEFT UPPER EXTREMITY: full range of motion, normal strength & tone RIGHT UPPER EXTREMITY:  full range of motion, normal strength & tone LEFT LOWER EXTREMITY: strength intact, range of motion moderate   RIGHT LOWER EXTREMITY: strength intact, range of motion moderate   PSYCHIATRIC: the patient is alert & oriented to person, affect & behavior appropriate  LABS/RADIOLOGY: 10/30/2012:  Platelets 111, otherwise CBC normal.    Glucose 209, creatinine 1.58, otherwise BMP normal.    Chest x-ray:  No acute disease.    CT of the head:  No acute findings.    Blood culture x2 negative.     Albumin 2.4, AST 56, ALT 58, otherwise liver profile normal.    ASSESSMENT/PLAN:  Chronic kidney disease.  Stable.    Renovascular hypertension.  Well controlled.     Diabetes mellitus with renal complications.  Continue current medications.    Thrombocytopenia.  We will monitor.    I have reviewed patient's medical records received at  admission/from hospitalization.  CPT CODE: 96045

## 2012-12-16 ENCOUNTER — Other Ambulatory Visit: Payer: Self-pay | Admitting: Adult Health

## 2012-12-19 ENCOUNTER — Other Ambulatory Visit: Payer: Self-pay | Admitting: Adult Health

## 2013-01-07 ENCOUNTER — Other Ambulatory Visit: Payer: Self-pay | Admitting: Adult Health

## 2013-01-21 ENCOUNTER — Other Ambulatory Visit: Payer: Self-pay | Admitting: Adult Health

## 2013-09-01 ENCOUNTER — Other Ambulatory Visit: Payer: Self-pay | Admitting: Internal Medicine

## 2013-09-01 DIAGNOSIS — R10819 Abdominal tenderness, unspecified site: Secondary | ICD-10-CM

## 2013-09-01 DIAGNOSIS — N289 Disorder of kidney and ureter, unspecified: Secondary | ICD-10-CM

## 2013-09-07 ENCOUNTER — Ambulatory Visit
Admission: RE | Admit: 2013-09-07 | Discharge: 2013-09-07 | Disposition: A | Discharge status: Home / Self Care | Payer: Medicare Other | Source: Ambulatory Visit | Attending: Internal Medicine | Admitting: Internal Medicine

## 2013-09-07 DIAGNOSIS — N289 Disorder of kidney and ureter, unspecified: Secondary | ICD-10-CM

## 2013-09-07 DIAGNOSIS — R10819 Abdominal tenderness, unspecified site: Secondary | ICD-10-CM

## 2013-11-06 ENCOUNTER — Other Ambulatory Visit: Payer: Self-pay | Admitting: Urology

## 2013-11-15 ENCOUNTER — Encounter (HOSPITAL_BASED_OUTPATIENT_CLINIC_OR_DEPARTMENT_OTHER): Payer: Self-pay | Admitting: *Deleted

## 2013-11-16 ENCOUNTER — Encounter (HOSPITAL_BASED_OUTPATIENT_CLINIC_OR_DEPARTMENT_OTHER): Payer: Self-pay | Admitting: *Deleted

## 2013-11-16 NOTE — Progress Notes (Signed)
11/16/13 1510  OBSTRUCTIVE SLEEP APNEA  Have you ever been diagnosed with sleep apnea through a sleep study? No  Do you snore loudly (loud enough to be heard through closed doors)?  1  Do you often feel tired, fatigued, or sleepy during the daytime? 0  Has anyone observed you stop breathing during your sleep? 0  Do you have, or are you being treated for high blood pressure? 1  BMI more than 35 kg/m2? 0  Age over 78 years old? 1  Neck circumference greater than 40 cm/16 inches? 0  Gender: 1  Obstructive Sleep Apnea Score 4  Score 4 or greater  Results sent to PCP

## 2013-11-16 NOTE — Progress Notes (Signed)
SPOKE W/ PT AND WILL SPEAK WITH IS DAUGHTER ABOUT INSTRUCTIONS PER HIS REQUEST.  LM FOR  DAUGHTER.  NPO AFTER MN. ARRIVE AT 0930. NEEDS ISTAT 8 AND EKG. WILL DO FLEET ENEMA AM .

## 2013-11-21 ENCOUNTER — Ambulatory Visit (HOSPITAL_BASED_OUTPATIENT_CLINIC_OR_DEPARTMENT_OTHER)
Admission: RE | Admit: 2013-11-21 | Discharge: 2013-11-21 | Disposition: A | Discharge status: Home / Self Care | Payer: Medicare Other | Source: Ambulatory Visit | Attending: Urology | Admitting: Urology

## 2013-11-21 ENCOUNTER — Encounter (HOSPITAL_BASED_OUTPATIENT_CLINIC_OR_DEPARTMENT_OTHER): Payer: Self-pay | Admitting: *Deleted

## 2013-11-21 ENCOUNTER — Encounter (HOSPITAL_BASED_OUTPATIENT_CLINIC_OR_DEPARTMENT_OTHER)
Admission: RE | Disposition: A | Discharge status: Home / Self Care | Payer: Self-pay | Source: Ambulatory Visit | Attending: Urology

## 2013-11-21 ENCOUNTER — Encounter (HOSPITAL_BASED_OUTPATIENT_CLINIC_OR_DEPARTMENT_OTHER): Payer: Medicare Other | Admitting: Anesthesiology

## 2013-11-21 ENCOUNTER — Ambulatory Visit (HOSPITAL_BASED_OUTPATIENT_CLINIC_OR_DEPARTMENT_OTHER): Payer: Medicare Other | Admitting: Anesthesiology

## 2013-11-21 DIAGNOSIS — I120 Hypertensive chronic kidney disease with stage 5 chronic kidney disease or end stage renal disease: Secondary | ICD-10-CM | POA: Diagnosis not present

## 2013-11-21 DIAGNOSIS — N1330 Unspecified hydronephrosis: Secondary | ICD-10-CM | POA: Diagnosis present

## 2013-11-21 DIAGNOSIS — E119 Type 2 diabetes mellitus without complications: Secondary | ICD-10-CM | POA: Diagnosis not present

## 2013-11-21 DIAGNOSIS — N200 Calculus of kidney: Secondary | ICD-10-CM | POA: Insufficient documentation

## 2013-11-21 DIAGNOSIS — N3941 Urge incontinence: Secondary | ICD-10-CM | POA: Insufficient documentation

## 2013-11-21 DIAGNOSIS — N189 Chronic kidney disease, unspecified: Secondary | ICD-10-CM | POA: Diagnosis not present

## 2013-11-21 DIAGNOSIS — N4289 Other specified disorders of prostate: Secondary | ICD-10-CM | POA: Insufficient documentation

## 2013-11-21 DIAGNOSIS — Z86718 Personal history of other venous thrombosis and embolism: Secondary | ICD-10-CM | POA: Diagnosis not present

## 2013-11-21 DIAGNOSIS — C61 Malignant neoplasm of prostate: Secondary | ICD-10-CM | POA: Insufficient documentation

## 2013-11-21 HISTORY — DX: Unspecified osteoarthritis, unspecified site: M19.90

## 2013-11-21 HISTORY — DX: Unspecified hydronephrosis: N13.30

## 2013-11-21 HISTORY — PX: PROSTATE BIOPSY: SHX241

## 2013-11-21 HISTORY — DX: Type 2 diabetes mellitus without complications: E11.9

## 2013-11-21 HISTORY — DX: Hyperlipidemia, unspecified: E78.5

## 2013-11-21 HISTORY — DX: Other specified personal risk factors, not elsewhere classified: Z91.89

## 2013-11-21 HISTORY — DX: Disorder of kidney and ureter, unspecified: N28.9

## 2013-11-21 HISTORY — DX: Cyst of kidney, acquired: N28.1

## 2013-11-21 HISTORY — PX: CYSTOSCOPY W/ RETROGRADES: SHX1426

## 2013-11-21 LAB — GLUCOSE, CAPILLARY: Glucose-Capillary: 175 mg/dL — ABNORMAL HIGH (ref 70–99)

## 2013-11-21 LAB — POCT I-STAT, CHEM 8
BUN: 32 mg/dL — ABNORMAL HIGH (ref 6–23)
CALCIUM ION: 1.31 mmol/L — AB (ref 1.13–1.30)
Chloride: 103 mEq/L (ref 96–112)
Creatinine, Ser: 2.2 mg/dL — ABNORMAL HIGH (ref 0.50–1.35)
GLUCOSE: 188 mg/dL — AB (ref 70–99)
HEMATOCRIT: 45 % (ref 39.0–52.0)
HEMOGLOBIN: 15.3 g/dL (ref 13.0–17.0)
Potassium: 4.4 mEq/L (ref 3.7–5.3)
Sodium: 141 mEq/L (ref 137–147)
TCO2: 28 mmol/L (ref 0–100)

## 2013-11-21 SURGERY — CYSTOSCOPY, WITH RETROGRADE PYELOGRAM
Anesthesia: General | Site: Ureter

## 2013-11-21 MED ORDER — MEPERIDINE HCL 25 MG/ML IJ SOLN
6.2500 mg | INTRAMUSCULAR | Status: DC | PRN
  Filled 2013-11-21: qty 1

## 2013-11-21 MED ORDER — OXYCODONE HCL 5 MG PO TABS
5.0000 mg | ORAL_TABLET | Freq: Once | ORAL | Status: DC | PRN
  Filled 2013-11-21: qty 1

## 2013-11-21 MED ORDER — STERILE WATER FOR IRRIGATION IR SOLN
Status: DC | PRN
  Administered 2013-11-21: 3000 mL

## 2013-11-21 MED ORDER — GENTAMICIN SULFATE 40 MG/ML IJ SOLN
105.0000 mg | Freq: Once | INTRAVENOUS | Status: AC
  Administered 2013-11-21: 115 mg via INTRAVENOUS
  Filled 2013-11-21: qty 2.75

## 2013-11-21 MED ORDER — DEXAMETHASONE SODIUM PHOSPHATE 10 MG/ML IJ SOLN
INTRAMUSCULAR | Status: DC | PRN
  Administered 2013-11-21: 10 mg via INTRAVENOUS

## 2013-11-21 MED ORDER — MIDAZOLAM HCL 2 MG/2ML IJ SOLN
INTRAMUSCULAR | Status: AC
  Filled 2013-11-21: qty 2

## 2013-11-21 MED ORDER — HYDROMORPHONE HCL 1 MG/ML IJ SOLN
0.2500 mg | INTRAMUSCULAR | Status: DC | PRN
  Filled 2013-11-21: qty 1

## 2013-11-21 MED ORDER — LEVOFLOXACIN 250 MG PO TABS: 250.0000 mg | ORAL_TABLET | Freq: Every day | ORAL | Status: DC

## 2013-11-21 MED ORDER — PROPOFOL INFUSION 10 MG/ML OPTIME
INTRAVENOUS | Status: DC | PRN
  Administered 2013-11-21: 30 mL via INTRAVENOUS
  Administered 2013-11-21: 100 mL via INTRAVENOUS

## 2013-11-21 MED ORDER — PROMETHAZINE HCL 25 MG/ML IJ SOLN
6.2500 mg | INTRAMUSCULAR | Status: DC | PRN
  Filled 2013-11-21: qty 1

## 2013-11-21 MED ORDER — FENTANYL CITRATE 0.05 MG/ML IJ SOLN
INTRAMUSCULAR | Status: AC
  Filled 2013-11-21: qty 4

## 2013-11-21 MED ORDER — FENTANYL CITRATE 0.05 MG/ML IJ SOLN
INTRAMUSCULAR | Status: DC | PRN
  Administered 2013-11-21 (×3): 25 ug via INTRAVENOUS

## 2013-11-21 MED ORDER — GENTAMICIN SULFATE 40 MG/ML IJ SOLN: 5.0000 mg/kg | INTRAVENOUS | Status: DC

## 2013-11-21 MED ORDER — OXYCODONE HCL 5 MG/5ML PO SOLN
5.0000 mg | Freq: Once | ORAL | Status: DC | PRN
  Filled 2013-11-21: qty 5

## 2013-11-21 MED ORDER — LACTATED RINGERS IV SOLN
INTRAVENOUS | Status: DC
  Administered 2013-11-21 (×3): via INTRAVENOUS
  Filled 2013-11-21: qty 1000

## 2013-11-21 MED ORDER — ONDANSETRON HCL 4 MG/2ML IJ SOLN
INTRAMUSCULAR | Status: DC | PRN
  Administered 2013-11-21: 4 mg via INTRAVENOUS

## 2013-11-21 MED ORDER — LIDOCAINE HCL (CARDIAC) 20 MG/ML IV SOLN
INTRAVENOUS | Status: DC | PRN
  Administered 2013-11-21: 50 mg via INTRAVENOUS

## 2013-11-21 MED ORDER — IOHEXOL 350 MG/ML SOLN
INTRAVENOUS | Status: DC | PRN
  Administered 2013-11-21: 10 mL via URETHRAL

## 2013-11-21 MED ORDER — EPHEDRINE SULFATE 50 MG/ML IJ SOLN
INTRAMUSCULAR | Status: DC | PRN
  Administered 2013-11-21 (×2): 10 mg via INTRAVENOUS

## 2013-11-21 SURGICAL SUPPLY — 42 items
BAG DRAIN URO-CYSTO SKYTR STRL (DRAIN) ×4 IMPLANT
BAG DRN UROCATH (DRAIN) ×2
BASKET LASER NITINOL 1.9FR (BASKET) IMPLANT
BASKET SEGURA 3FR (UROLOGICAL SUPPLIES) IMPLANT
BASKET STNLS GEMINI 4WIRE 3FR (BASKET) IMPLANT
BASKET ZERO TIP NITINOL 2.4FR (BASKET) IMPLANT
BSKT STON RTRVL 120 1.9FR (BASKET)
BSKT STON RTRVL GEM 120X11 3FR (BASKET)
BSKT STON RTRVL ZERO TP 2.4FR (BASKET)
CANISTER SUCT LVC 12 LTR MEDI- (MISCELLANEOUS) ×2 IMPLANT
CATH URET 5FR 28IN CONE TIP (BALLOONS)
CATH URET 5FR 28IN OPEN ENDED (CATHETERS) ×2 IMPLANT
CATH URET 5FR 70CM CONE TIP (BALLOONS) IMPLANT
CLOTH BEACON ORANGE TIMEOUT ST (SAFETY) ×4 IMPLANT
DRAPE CAMERA CLOSED 9X96 (DRAPES) ×4 IMPLANT
DRESSING TELFA 8X3 (GAUZE/BANDAGES/DRESSINGS) ×2 IMPLANT
ELECT REM PT RETURN 9FT ADLT (ELECTROSURGICAL)
ELECTRODE REM PT RTRN 9FT ADLT (ELECTROSURGICAL) IMPLANT
FIBER LASER FLEXIVA 1000 (UROLOGICAL SUPPLIES) IMPLANT
FIBER LASER FLEXIVA 200 (UROLOGICAL SUPPLIES) IMPLANT
FIBER LASER FLEXIVA 365 (UROLOGICAL SUPPLIES) IMPLANT
FIBER LASER FLEXIVA 550 (UROLOGICAL SUPPLIES) IMPLANT
GLOVE INDICATOR 6.5 STRL GRN (GLOVE) ×2 IMPLANT
GLOVE SURG SS PI 8.0 STRL IVOR (GLOVE) ×4 IMPLANT
GOWN STRL REIN XL XLG (GOWN DISPOSABLE) ×2 IMPLANT
GOWN STRL REUS W/TWL LRG LVL3 (GOWN DISPOSABLE) ×2 IMPLANT
GOWN STRL REUS W/TWL XL LVL3 (GOWN DISPOSABLE) ×4 IMPLANT
GOWN XL W/COTTON TOWEL STD (GOWNS) ×2 IMPLANT
GUIDEWIRE 0.038 PTFE COATED (WIRE) IMPLANT
GUIDEWIRE ANG ZIPWIRE 038X150 (WIRE) IMPLANT
GUIDEWIRE STR DUAL SENSOR (WIRE) ×4 IMPLANT
KIT BALLIN UROMAX 15FX10 (LABEL) IMPLANT
KIT BALLN UROMAX 15FX4 (MISCELLANEOUS) IMPLANT
KIT BALLN UROMAX 26 75X4 (MISCELLANEOUS)
PACK CYSTO (CUSTOM PROCEDURE TRAY) ×4 IMPLANT
SET HIGH PRES BAL DIL (LABEL)
SHEATH URET ACCESS 12FR/35CM (UROLOGICAL SUPPLIES) IMPLANT
SHEATH URET ACCESS 12FR/55CM (UROLOGICAL SUPPLIES) IMPLANT
SURGILUBE 2OZ TUBE FLIPTOP (MISCELLANEOUS) ×2 IMPLANT
TOWEL OR 17X24 6PK STRL BLUE (TOWEL DISPOSABLE) ×4 IMPLANT
UNDERPAD 30X30 INCONTINENT (UNDERPADS AND DIAPERS) ×2 IMPLANT
WATER STERILE IRR 3000ML UROMA (IV SOLUTION) ×2 IMPLANT

## 2013-11-21 NOTE — Interval H&P Note (Signed)
History and Physical Interval Note:  11/21/2013 11:02 AM  Tony Vasquez  has presented today for surgery, with the diagnosis of LEFT HYDRONEPHROSIS/PROSTATE NODULE  The various methods of treatment have been discussed with the patient and family. After consideration of risks, benefits and other options for treatment, the patient has consented to  Procedure(s): LEFT CYSTOSCOPY WITH RETROGRADE PYELOGRAM (Left) BIOPSY TRANSRECTAL ULTRASONIC PROSTATE (TUBP) (N/A) as a surgical intervention .  The patient's history has been reviewed, patient examined, no change in status, stable for surgery.  I have reviewed the patient's chart and labs.  Questions were answered to the patient's satisfaction.     Lashana Spang J

## 2013-11-21 NOTE — Anesthesia Procedure Notes (Signed)
Procedure Name: LMA Insertion Date/Time: 11/21/2013 11:25 AM Performed by: Wanita Chamberlain Pre-anesthesia Checklist: Emergency Drugs available, Patient identified, Timeout performed, Suction available and Patient being monitored Patient Re-evaluated:Patient Re-evaluated prior to inductionOxygen Delivery Method: Circle system utilized Preoxygenation: Pre-oxygenation with 100% oxygen Intubation Type: IV induction Ventilation: Mask ventilation without difficulty LMA: LMA inserted LMA Size: 5.0 Number of attempts: 1 Placement Confirmation: positive ETCO2 and breath sounds checked- equal and bilateral Tube secured with: Tape

## 2013-11-21 NOTE — Anesthesia Preprocedure Evaluation (Addendum)
Anesthesia Evaluation  Patient identified by MRN, date of birth, ID band Patient awake    Reviewed: Allergy & Precautions, H&P , NPO status , Patient's Chart, lab work & pertinent test results  Airway Mallampati: II TM Distance: >3 FB Neck ROM: Full    Dental no notable dental hx.    Pulmonary neg pulmonary ROS,  breath sounds clear to auscultation  Pulmonary exam normal       Cardiovascular hypertension, Pt. on medications Rhythm:Regular Rate:Normal     Neuro/Psych negative neurological ROS  negative psych ROS   GI/Hepatic negative GI ROS, Neg liver ROS,   Endo/Other  diabetes, Type 2, Oral Hypoglycemic Agents  Renal/GU CRF and Renal InsufficiencyRenal disease     Musculoskeletal  (+) Arthritis -,   Abdominal   Peds  Hematology negative hematology ROS (+) anemia ,   Anesthesia Other Findings   Reproductive/Obstetrics negative OB ROS                       Anesthesia Physical Anesthesia Plan  ASA: III  Anesthesia Plan: General   Post-op Pain Management:    Induction: Intravenous  Airway Management Planned: LMA  Additional Equipment:   Intra-op Plan:   Post-operative Plan: Extubation in OR  Informed Consent: I have reviewed the patients History and Physical, chart, labs and discussed the procedure including the risks, benefits and alternatives for the proposed anesthesia with the patient or authorized representative who has indicated his/her understanding and acceptance.   Dental advisory given  Plan Discussed with: CRNA  Anesthesia Plan Comments:         Anesthesia Quick Evaluation

## 2013-11-21 NOTE — Op Note (Signed)
Preoperative diagnosis:  1. Left hydronephrosis 2. Left renal stone 3. Elevated PSA 4. Left prostate nodule  Postoperative diagnosis: 1. Same  Procedure(s): 1. Cystoscopy with left retrograde pyelogram 2. Transrectal ultrasound with interpretation 3. Transrectal ultrasound-guided prostate biopsy  Surgeon: Dr. Irine Seal  Anesthesia: General  Complications: None  EBL: None  Specimens: None  Intraoperative findings: Minimal left hydronephrosis with infundibular narrowing. Left mid pole stone is visible on fluoroscopy. The ureter drained spontaneously. The renal pelvis was slow to drain but the proximal ureter was seen filling.   TRUS: L = 3.23cm, H = 2.36cm, W = 4.62 cm; volume 18.5 cc. Hypoechoic lesion at left base.  Indication: [name] with left hydronephrosis and renal insufficiency, as well as a elevated PSA and left prostate nodule at the base. Discussed all options in detail and patient wishes to proceed with left retrograde pyelogram and prostate biopsy.  Description of procedure: Patient, consent, and operative site were verified in holding and they were brought to the operating room and placed supine on the OR table. General anesthesia was induced. IV antibiotics were administered and SCDs were placed. They were then placed in the dorsal lithotomy position and prepped and draped in the usual sterile fashion. Pre-procedure time out was called.  The 22.5 Fr rigid cystoscopy with 30 degree lens was advanced into the bladder per urethra with ample lubrication and saline running for irrigation. The bladder was inspected and was free of tumors, lesions, stones, and diverticulum. Attention was turned to the left ureteral orifice. The 5Fr open ended was advanced into the distal ureter and retrograde pyelogram was performed with the above noted findings.  The patient was then undraped then placed in the lateral decubitus position with left side down. A DRE was performed confirming the  presence of a left base nodule. A transrectal ultrasound was performed with the above noted findings.   The prostate was biopsied in the standard 12 core template. There was no significant bleeding at the end of the case.  He was awoken from general anesthesia having tolerated the procedure well.

## 2013-11-21 NOTE — Discharge Instructions (Signed)
1. You may see some blood in the urine, ejaculate, and or stool and may have some burning with urination for 48-72 hours. You also may notice that you have to urinate more frequently or urgently after your procedure which is normal.  2. You should call should you develop an inability urinate, fever > 101, persistent nausea and vomiting that prevents you from eating or drinking to stay hydrated.   Post Anesthesia Home Care Instructions  Activity: Get plenty of rest for the remainder of the day. A responsible adult should stay with you for 24 hours following the procedure.  For the next 24 hours, DO NOT: -Drive a car -Paediatric nurse -Drink alcoholic beverages -Take any medication unless instructed by your physician -Make any legal decisions or sign important papers.  Meals: Start with liquid foods such as gelatin or soup. Progress to regular foods as tolerated. Avoid greasy, spicy, heavy foods. If nausea and/or vomiting occur, drink only clear liquids until the nausea and/or vomiting subsides. Call your physician if vomiting continues.  Special Instructions/Symptoms: Your throat may feel dry or sore from the anesthesia or the breathing tube placed in your throat during surgery. If this causes discomfort, gargle with warm salt water. The discomfort should disappear within 24 hours.

## 2013-11-21 NOTE — H&P (Signed)
ctive Problems Problems  1. Elevated prostate specific antigen (PSA) (790.93) 2. Hydronephrosis, left (591) 3. Microscopic hematuria (599.72) 4. Nephrolithiasis (592.0) 5. Nodular prostate with lower urinary tract symptoms (600.11) 6. Renal insufficiency (593.9) 7. Urge incontinence of urine (788.31)  History of Present Illness Tony Vasquez returns today to discuss his test results.  He has a 1.5cm nodule at the left prostate base and a PSA the day of his visit was 4.35 which was up from 3 in January.  He had 3-6 RBC's and a Cr of 2.4.  CT urogram showed mild left hydro into the proximal ureter and bilateral small renal stones but no ureteral stones.  He also has UUI.   Past Medical History Problems  1. History of deep vein thrombophlebitis of lower extremity (V12.52) 2. History of diabetes mellitus (V12.29) 3. History of hypertension (V12.59)  Surgical History Problems  1. History of Hernia Repair  Current Meds 1. Furosemide 20 MG Oral Tablet;  Therapy: (Recorded:14Sep2015) to Recorded 2. GlipiZIDE 5 MG Oral Tablet;  Therapy: (Recorded:14Sep2015) to Recorded 3. Januvia 100 MG Oral Tablet;  Therapy: (Recorded:14Sep2015) to Recorded  Allergies Medication  1. No Known Drug Allergies  Family History Problems  1. Family history of diabetes mellitus (V18.0) : Mother, Father, Sibling 2. Family history of hypertension (V17.49)  Social History Problems  1. Denied: History of Alcohol use 2. Denied: History of Caffeine use 3. Never a smoker 4. Number of children   2 sons    2 daughters 21. Retired   retired Water quality scientist 6. Widowed  Results/Data Urine [Data Includes: Last 1 Day]   17Sep2015  COLOR YELLOW   APPEARANCE CLEAR   SPECIFIC GRAVITY 1.015   pH 6.0   GLUCOSE NEG mg/dL  BILIRUBIN NEG   KETONE NEG mg/dL  BLOOD SMALL   PROTEIN NEG mg/dL  UROBILINOGEN 0.2 mg/dL  NITRITE NEG   LEUKOCYTE ESTERASE NEGATIVE   SQUAMOUS EPITHELIAL/HPF RARE   WBC 0-2 WBC/hpf  RBC  0-2 RBC/hpf  BACTERIA NONE SEEN   CRYSTALS NONE SEEN   CASTS NONE SEEN   Selected Results  BUN & CREATININE 14Sep2015 10:58AM Irine Seal  SPECIMEN TYPE: BLOOD   Test Name Result Flag Reference  CREATININE 2.40 mg/dL H 0.50-1.50  BUN 26 mg/dL  6-23  Est GFR, African American 29 mL/min L   PERFORMED AT:        [facility]                      [address]                      [place], [place] [id number]  Est GFR, NonAfrican American 25 mL/min L   THE ESTIMATED GFR IS A CALCULATION VALID FOR ADULTS (>=54 YEARS OLD) THAT USES THE CKD-EPI ALGORITHM TO ADJUST FOR AGE AND SEX. IT IS   NOT TO BE USED FOR CHILDREN, PREGNANT WOMEN, HOSPITALIZED PATIENTS,    PATIENTS ON DIALYSIS, OR WITH RAPIDLY CHANGING KIDNEY FUNCTION. ACCORDING TO THE NKDEP, EGFR >89 IS NORMAL, 60-89 SHOWS MILD IMPAIRMENT, 30-59 SHOWS MODERATE IMPAIRMENT, 15-29 SHOWS SEVERE IMPAIRMENT AND <15 IS ESRD.   PSA REFLEX TO FREE 14Sep2015 10:58AM Irine Seal  SPECIMEN TYPE: BLOOD   Test Name Result Flag Reference  PSA 4.35 ng/mL H <=4.00  TEST METHODOLOGY: ECLIA PSA (ELECTROCHEMILUMINESCENCE IMMUNOASSAY)  PSA, FREE 0.98 ng/mL    PSA, %FREE 23 % L > 25  PROBABILITY OF PROSTATE CANCER   (FOR  MEN WITH NON-SUSPICIOUS DRE RESULTS AND PSA BETWEEN 4 AND   10 NG/ML, BY PATIENT AGE)     % FREE PSA                          PATIENT AGE                          50 TO 59 YEARS  60 TO 69 YEARS  >70 YEARS    <=10%                  49.2%           57.5%          64.5%    11 - 18%               26.9%           33.9%          40.8%    19 - 25%               18.3%           23.9%          29.7%    >25%                    9.1%           12.2%          15.8%   CT-ABD/PELVIS W/O CONTRAST 14Sep2015 12:00AM Irine Seal   Test Name Result Flag Reference  CT-ABD/PELVIS W/O CONTRAST (Report)    ** RADIOLOGY REPORT BY [facility] **   CLINICAL DATA: Microhematuria  EXAM: CT ABDOMEN AND PELVIS WITHOUT  CONTRAST  TECHNIQUE: Multidetector CT imaging of the abdomen and pelvis was performed following the standard protocol without IV contrast.  COMPARISON: None.  FINDINGS: Lower chest: The lung bases are clear. No pleural or pericardial effusion noted.  Hepatobiliary: No focal liver abnormality identified. The gallbladder appears normal. No biliary dilatation.  Spleen: Normal.  Pancreas: Appears normal.  Stomach/Bowel: The stomach is on unremarkable. Normal caliber of the small bowel loops. There is a right inguinal hernia which contains nonobstructed loops of small bowel. The terminal ileum is normal. The appendix is unremarkable. Multiple colonic diverticula are identified. There is no acute inflammation.  Adrenals/urinary tract: The adrenal glands both appear normal. Bilateral renal calculi identified. Within the upper pole of the right kidney there is a single stone measuring 3 mm. Multiple left renal calculi are identified. The largest is in the inferior pole of the left kidney measuring 5 mm. Cyst within the inferior pole of the left kidney measures 5.2 x 4.6 cm. This is incompletely characterized without IV contrast material. There is no right hydronephrosis or hydroureter. Left pelvocaliectasis and mild left hydroureter is noted. No ureteral lithiasis identified.  Vascular/Lymphatic: Calcified atherosclerotic disease involves the abdominal aorta. No aneurysm. Retroaortic left renal vein is noted. There is no retroperitoneal adenopathy. No enlarged mesenteric nodes. There is no enlarged pelvic or inguinal adenopathy. A large varix is identified extending across the ventral pelvic wall from the right common femoral vein to the left common femoral vein.  Reproductive: Mild prostate gland enlargement. Symmetric appearance of the seminal vesicles.  Muskuloskeletal: Review of the visualized bony structures is significant for multi level degenerative disc disease within  the lumbar spine. This is most advanced at L5-S1.  Other: No free  fluid identified within the pelvis.  IMPRESSION: 1. Bilateral nephrolithiasis. There is left pelvocaliectasis and mild hydroureter without evidence for obstructing calculus. 2. Right inguinal hernia containing nonobstructed loop of small bowel. 3. Atherosclerotic disease. 4. Large venous varix is identified which extends across the lower pelvic wall. 5. Lumbar degenerative disc disease.   Electronically Signed  By: Kerby Moors M.D.  On: 10/30/2013 15:32   Assessment Assessed  1. Elevated prostate specific antigen (PSA) (790.93) 2. Hydronephrosis, left (591) 3. Microscopic hematuria (599.72) 4. Nephrolithiasis (592.0) 5. Nodular prostate with lower urinary tract symptoms (600.11) 6. Renal insufficiency (593.9)  He has a prostate nodule with a rising PSA and hematuria with mild left hydro without an obvious cause.  He also has bilateral renal stones and may have recently passed a ureteral stone on the left but he has residual renal insufficiency.  He has UUI as well.   Plan Health Maintenance  1. UA With REFLEX; [Do Not Release]; Status:Resulted - Requires Verification;   Done:  [id number] 09:31AM Nephrolithiasis  2. KUB; Status:Hold For - Exact Date,Date of Service; Requested for:Mar 2016;  Nodular prostate with lower urinary tract symptoms  3. Follow-up Month x 6 Office  Follow-up  Status: Hold For - Exact Date,Date of Service   Requested for: Mar 2016 4. Follow-up Schedule Surgery Office  Follow-up  Status: Hold For - Appointment   Requested for: 17Sep2015 5. PSA REFLEX TO FREE; Status:Hold For - Specimen/Data Collection; Requested for:Mar  2016;   He needs a prostate [place] and biopsy and cystoscopy with left retrograde pyelogram.  I have reviewed the risks of bleeding, infection, difficulty voiding, possible need for a stent, thrombotic events and anesthetic complications.   Discussion/Summary CC:  Dr. Corliss Parish and Dr. Domenick Gong.

## 2013-11-22 ENCOUNTER — Encounter (HOSPITAL_BASED_OUTPATIENT_CLINIC_OR_DEPARTMENT_OTHER): Payer: Self-pay | Admitting: Urology

## 2013-11-22 NOTE — Transfer of Care (Signed)
Immediate Anesthesia Transfer of Care Note  Patient: Tony Vasquez  Procedure(s) Performed: Procedure(s): LEFT CYSTOSCOPY WITH RETROGRADE PYELOGRAM (Left) BIOPSY TRANSRECTAL ULTRASONIC PROSTATE (TUBP) (N/A)  Patient Location: PACU  Anesthesia Type:General  Level of Consciousness: awake, alert , oriented and patient cooperative  Airway & Oxygen Therapy: Patient Spontanous Breathing and Patient connected to nasal cannula oxygen  Post-op Assessment: Report given to PACU RN and Post -op Vital signs reviewed and stable  Post vital signs: Reviewed and stable  Complications: No apparent anesthesia complications

## 2013-11-23 NOTE — Anesthesia Postprocedure Evaluation (Signed)
Anesthesia Post Note  Patient: Tony Vasquez  Procedure(s) Performed: Procedure(s) (LRB): LEFT CYSTOSCOPY WITH RETROGRADE PYELOGRAM (Left) BIOPSY TRANSRECTAL ULTRASONIC PROSTATE (TUBP) (N/A)  Anesthesia type: General  Patient location: PACU  Post pain: Pain level controlled  Post assessment: Post-op Vital signs reviewed  Last Vitals: BP 135/69  Pulse 67  Temp(Src) 36.7 C (Oral)  Resp 18  Ht 5' 5.5" (1.664 m)  Wt 155 lb (70.308 kg)  BMI 25.39 kg/m2  SpO2 99%  Post vital signs: Reviewed  Level of consciousness: sedated  Complications: No apparent anesthesia complications

## 2013-12-15 ENCOUNTER — Encounter: Payer: Self-pay | Admitting: Radiation Oncology

## 2013-12-15 NOTE — Progress Notes (Signed)
GU Location of Tumor / Histology: prostate cancer  If Prostate Cancer, Gleason Score in 11/12 cores  is (7 (4+3) and PSA is (4.3), prostate volume is 18.5 ml.  Tony Vasquez presented with a prostate nodule, a rising PSA and hematuria.  He reported pain in his lower back and was seen by a kidney specialist who then referred him to Dr. Jeffie Pollock.  Biopsies revealed:   11/21/13 Diagnosis 1. Prostate, needle biopsy(ies), right base lateral - MICROSCOPIC FOCUS OF PROSTATIC ADENOCARCINOMA, GLEASON'S SCORE 3+3=6, INVOLVING LESS THAN 2% OF THE TISSUE, ONE OF ONE CORE. - PERINEURAL INVASION PRESENT. 2. Prostate, needle biopsy(ies), right base medial - BENIGN PROSTATIC TISSUE WITH GLANDULAR ATROPHY. - NO EVIDENCE OF MALIGNANCY. 3. Prostate, needle biopsy(ies), right mid lateral - PROSTATIC ADENOCARCINOMA, GLEASON'S SCORE 3+4=7, INVOLVING 60% OF THE TISSUE, ONE OF ONE CORE. - PERINEURAL INVASION PRESENT. 4. Prostate, needle biopsy(ies), right mid medial - PROSTATIC ADENOCARCINOMA, GLEASON'S SCORE 3+4=7, INVOLVING 60% OF THE TISSUE, ONE OF ONE CORE. - PERINEURAL INVASION PRESENT. 5. Prostate, needle biopsy(ies), right apex lateral - MICROSCOPIC FOCUS OF PROSTATIC ADENOCARCINOMA, GLEASON'S SCORE 3+4=7, INVOLVING LESS THAN 10% OF THE TISSUE, ONE OF ONE CORE. - PERINEURAL INVASION PRESENT. 6. Prostate, needle biopsy(ies), right apex medial - PROSTATIC ADENOCARCINOMA, GLEASON'S SCORE 3+4=7, INVOLVING 20% OF THE TISSUE, ONE OF ONE CORE. - NO PERINEURAL INVASION IDENTIFIED. 7. Prostate, needle biopsy(ies), left mid lateral - PROSTATIC ADENOCARCINOMA, GLEASON'S SCORE 3+4=7, INVOLVING MORE THAN 90% OF THE TISSUE, ONE OF ONE CORE. - NO PERINEURAL INVASION IDENTIFIED. 8. Prostate, needle biopsy(ies), left base medial - PROSTATIC ADENOCARCINOMA, GLEASON'S SCORE 4+3=7, INVOLVING 90% OF THE TISSUE, ONE OF ONE CORE. - NO PERINEURAL INVASION IDENTIFIED. 9. Prostate, needle biopsy(ies), left mid  lateral - PROSTATIC ADENOCARCINOMA, GLEASON'S SCORE 4+3=7, INVOLVING 70% OF THE TISSUE, ONE OF ONE CORE. - PERINEURAL INVASION PRESENT. 10. Prostate, needle biopsy(ies), left mid medial - PROSTATIC ADENOCARCINOMA, GLEASON'S SCORE 4+3=7, INVOLVING 90% OF THE TISSUE, ONE OF ONE CORE. - NO PERINEURAL INVASION IDENTIFIED. 11. Prostate, needle biopsy(ies), left apex lateral - PROSTATIC ADENOCARCINOMA, GLEASON'S SCORE 3+4=7, INVOLVING 40% OF THE TISSUE, ONE OF ONE CORE. - NO PERINEURAL INVASION IDENTIFIED. 12. Prostate, needle biopsy(ies), left apex medial - PROSTATIC ADENOCARCINOMA, GLEASON'S SCORE 3+4=7, INVOLVING 15% OF THE TISSUE, ONE OF ONE CORE. - NO PERINEURAL INVASION IDENTIFIED.  Past/Anticipated interventions by urology, if any: Biopsy done 11/21/13.  Recommends 6-8 months of androgen ablation therapy.  Past/Anticipated interventions by medical oncology, if any: no  Weight changes, if any: he has lost 14 lbs since 9/14  Bowel/Bladder complaints, if any: no, reports 3 bowel movements per day.  Denies any bladder issues.  IPSS score of 0  Nausea/Vomiting, if any: no  Pain issues, if any: no  SAFETY ISSUES:  Prior radiation? no  Pacemaker/ICD? no  Possible current pregnancy? no  Is the patient on methotrexate? no  Current Complaints / other details:  Patient is here with his 2 daughters.  He has 4 children.  He is a retired Horticulturist, commercial.

## 2013-12-20 ENCOUNTER — Ambulatory Visit
Admission: RE | Admit: 2013-12-20 | Discharge: 2013-12-20 | Disposition: A | Discharge status: Home / Self Care | Payer: Medicare Other | Source: Ambulatory Visit | Attending: Radiation Oncology | Admitting: Radiation Oncology

## 2013-12-20 ENCOUNTER — Encounter: Payer: Self-pay | Admitting: Radiation Oncology

## 2013-12-20 VITALS — BP 159/72 | HR 72 | Temp 97.8°F | Resp 20 | Ht 65.0 in | Wt 161.6 lb

## 2013-12-20 DIAGNOSIS — C61 Malignant neoplasm of prostate: Secondary | ICD-10-CM

## 2013-12-20 DIAGNOSIS — I10 Essential (primary) hypertension: Secondary | ICD-10-CM | POA: Diagnosis not present

## 2013-12-20 DIAGNOSIS — Z510 Encounter for antineoplastic radiation therapy: Secondary | ICD-10-CM | POA: Insufficient documentation

## 2013-12-20 DIAGNOSIS — E119 Type 2 diabetes mellitus without complications: Secondary | ICD-10-CM | POA: Diagnosis not present

## 2013-12-20 HISTORY — DX: Malignant neoplasm of prostate: C61

## 2013-12-20 NOTE — Progress Notes (Signed)
Please see the Nurse Progress Note in the MD Initial Consult Encounter for this patient. 

## 2013-12-20 NOTE — Progress Notes (Signed)
Radiation Oncology         [phone] ________________________________  Initial outpatient Consultation  Name: Tony Vasquez MRN: [id number]  Date: 12/20/2013  DOB: [birth year]  FI:EPPIRJJ,OACZYSA W, MD  Malka So, MD   REFERRING PHYSICIAN: Malka So, MD  DIAGNOSIS: Stage T2a, N0, Mx Gleason's 7(4+3), PSA 4.35, Adenocarcinoma of the Prostate  HISTORY OF PRESENT ILLNESS::Tony Vasquez is a 80 y.o. male who is seen out courtesy of Dr. Irine Seal for an opinion concerning radiation therapy as part of management of patient's recently diagnosed adenocarcinoma the prostate. Earlier this year the patient was found to have renal insufficiency. The patient was referred to nephrology then to Dr. Jeffie Pollock for further evaluation. The patient was noted to have a palpable nodule within the left lobe of the prostate gland. The patient's PSA was mildly elevated at 4.35. Patient was also noted to have microscopic hematuria. The patient proceeded to undergo a CT scan of abdomen and pelvis which showed bilateral nephrolithiasis. There is mild left hydroureter without evidence for obstructing calculus. This was also noted to have a right inguinal hernia. The patient was taken to the operating room on October 7 with a preoperative diagnosis of left hydronephrosis, left renal stone, elevated PSA and left prostate nodule. He proceeded to undergo cystoscopy with left retrograde pyelogram, transrectal ultrasound and transrectal ultrasound-guided biopsy of the prostate. Patient was found to have a prostate volume of 18.5 cubic centimeters. There was a hypoechoic lesion noted in the left base. Patient was found to have 11/12 biopsies showing adenocarcinoma prostate with most significant disease along the left base and left mid prostate gland with Gleason score of 7 (4+3). The options for management including watchful waiting,  surgery and radiation therapy were discussed with the patient. In light of patient's  medical issues and age he was not felt to be a good candidate for surgery. Given the extent of involvement within the prostate gland watchful waiting was also not to be a very good option for the patient. Patient is now seen in radiation oncology for consideration for definitive treatment along with androgen ablation...  PREVIOUS RADIATION THERAPY: No  PAST MEDICAL HISTORY:  has a past medical history of Hypertension; Dyslipidemia; Type 2 diabetes mellitus; Hydronephrosis of left kidney; Mild renal insufficiency; Bilateral renal cysts; OA (osteoarthritis); At risk for sleep apnea; and Prostate cancer.    PAST SURGICAL HISTORY: Past Surgical History  Procedure Laterality Date  . Cataract extraction w/ intraocular lens implant Left   . Inguinal hernia repair Left 2000  . Cystoscopy w/ retrogrades Left 11/21/2013    Procedure: LEFT CYSTOSCOPY WITH RETROGRADE PYELOGRAM;  Surgeon: Malka So, MD;  Location: [facility];  Service: Urology;  Laterality: Left;  . Prostate biopsy N/A 11/21/2013    Procedure: BIOPSY TRANSRECTAL ULTRASONIC PROSTATE (TUBP);  Surgeon: Malka So, MD;  Location: [facility];  Service: Urology;  Laterality: N/A;  . Tonsilectomy/adenoidectomy with myringotomy      FAMILY HISTORY: family history includes Prostate cancer in his father.  SOCIAL HISTORY:  reports that he has never smoked. He has never used smokeless tobacco. He reports that he does not drink alcohol or use illicit drugs. he is a retired Horticulturist, commercial. Patient is widowed with his wife dying approximately 5 years ago  ALLERGIES: Shrimp and Augmentin  MEDICATIONS:  Current Outpatient Prescriptions  Medication Sig Dispense Refill  . furosemide (LASIX) 20 MG tablet Take 20 mg by mouth every morning.     Marland Kitchen  glipiZIDE (GLUCOTROL) 5 MG tablet Take 5 mg by mouth 2 (two) times daily before a meal.    . sitaGLIPtin (JANUVIA) 100 MG tablet Take 50 mg by mouth every morning.     No current  facility-administered medications for this encounter.    REVIEW OF SYSTEMS:  A 15 point review of systems is documented in the electronic medical record. This was obtained by the nursing staff. However, I reviewed this with the patient to discuss relevant findings and make appropriate changes. The patient completed the international prostate symptom score with total score of 0 recommend representing minimal symptomatology. Patient denies any new bony pain headaches dizziness or blurred vision. He denies any pain in the right groin.  he denies any gross hematuria or rectal bleeding. Patient does have urinary incontinence when going from sitting to standing and wears a depends for this issue. Erectile dysfunction is not an issue for him.   PHYSICAL EXAM:  height is 5\' 5"  (1.651 m) and weight is 161 lb 9.6 oz (73.301 kg). His oral temperature is 97.8 F (36.6 C). His blood pressure is 159/72 and his pulse is 72. His respiration is 20.   BP 159/72 mmHg  Pulse 72  Temp(Src) 97.8 F (36.6 C) (Oral)  Resp 20  Ht 5\' 5"  (1.651 m)  Wt 161 lb 9.6 oz (73.301 kg)  BMI 26.89 kg/m2  General Appearance:    Alert, cooperative, no distress, appears stated age, accompanied by his 2 daughters on evaluation today  Head:    Normocephalic, without obvious abnormality, atraumatic  Eyes:    PERRL, conjunctiva/corneas clear, EOM's intact,             Nose:   Nares normal, septum midline, mucosa normal, no drainage    or sinus tenderness  Throat:   Lips, mucosa, and tongue normal; edentulous,  gums normal  Neck:   Supple, symmetrical, trachea midline, no adenopathy;       thyroid:  No enlargement/tenderness/nodules; no carotid   bruit or JVD  Back:     Symmetric, no curvature, ROM normal, no CVA tenderness  Lungs:     Clear to auscultation bilaterally, respirations unlabored  Chest wall:    No tenderness or deformity  Heart:    Regular rate and rhythm, S1 and S2 normal, no murmur, rub   or gallop  Abdomen:      Soft, non-tender, bowel sounds active all four quadrants,    no masses, no organomegaly, easily reducible right inguinal hernia  Genitalia:    Normal male without lesion,uncircumcised , discharge or tenderness, testicles are soft without masses  Rectal:    Normal tone, the prostate gland is mildly enlarged. There is a palpable nodule in the left mid gland measuring approximately 1 cm in size. No obvious seminal vesicle involvement    Extremities:   Extremities normal, atraumatic, no cyanosis, the left lower extremities mildly edematous compared to the right secondary to the DVT many years ago  Pulses:   2+ and symmetric all extremities  Skin:   Skin color, texture, turgor normal, no rashes or lesions  Lymph nodes:   Cervical, supraclavicular, and axillary nodes normal  Neurologic:    Normal strength, sensation and reflexes      throughout     ECOG = 0  0 - Asymptomatic (Fully active, able to carry on all predisease activities without restriction)  LABORATORY DATA:  Lab Results  Component Value Date   WBC 6.7 11/02/2012   HGB 15.3 11/21/2013  HCT 45.0 11/21/2013   MCV 87.8 11/02/2012   PLT 212 11/02/2012   NEUTROABS 4.4 10/31/2012   Lab Results  Component Value Date   NA 141 11/21/2013   K 4.4 11/21/2013   CL 103 11/21/2013   CO2 24 11/02/2012   GLUCOSE 188* 11/21/2013   CREATININE 2.20* 11/21/2013   CALCIUM 8.8 11/02/2012      RADIOGRAPHY: No results found.    IMPRESSION: Stage T2a, N0, Mx Gleason's 7(4+3), PSA 4.3, Adenocarcinoma of the Prostate. I discussed with the patient and his daughters his prognosis as it relates to his presenting PSA Gleason score and clinical stage. I would agree with Dr. Jeffie Pollock,  given the extent of involvement within the prostate gland I would not recommend watchful waiting in this issue. Patient would be considered an immediate to high-risk situation and would likely benefit from androgen ablation as part of his overall treatment. I would  recommend intensity modulated radiation therapy for definitive treatment in this situation along with at least 6-8 months of androgen ablation. I discussed the treatment course side effects and potential toxicities of radiation therapy in this situation with the patient and his family. He appears to understand and wishes to proceed with planned course of treatment.  PLAN:The patient will be set up for  fiducial marker placement with Dr. Jeffie Pollock in the near future and for initiation of androgen ablation as part of his overall treatment.   I spent 60 minutes minutes face to face with the patient and more than 50% of that time was spent in counseling and/or coordination of care.   ------------------------------------------------  Blair Promise, PhD, MD

## 2013-12-25 ENCOUNTER — Telehealth: Payer: Self-pay | Admitting: *Deleted

## 2013-12-25 NOTE — Telephone Encounter (Signed)
Called patient to inform of gold seed placement on 12-28-13 @ 1 pm @ Dr. Ralene Muskrat Office and his sim on Nov. 17 @ 11 am @ Dr. Clabe Seal Office, spoke with Mr. Bernard daughter, [name] and she is aware of these appts.

## 2014-01-02 ENCOUNTER — Ambulatory Visit
Admission: RE | Admit: 2014-01-02 | Discharge: 2014-01-02 | Disposition: A | Discharge status: Home / Self Care | Payer: Medicare Other | Source: Ambulatory Visit | Attending: Radiation Oncology | Admitting: Radiation Oncology

## 2014-01-02 DIAGNOSIS — C61 Malignant neoplasm of prostate: Secondary | ICD-10-CM

## 2014-01-02 DIAGNOSIS — Z510 Encounter for antineoplastic radiation therapy: Secondary | ICD-10-CM | POA: Diagnosis not present

## 2014-01-02 NOTE — Progress Notes (Signed)
  Radiation Oncology         [phone] ________________________________  Name: Tony Vasquez MRN: [id number]  Date: 01/02/2014  DOB: 06-20-1932  SIMULATION AND TREATMENT PLANNING NOTE   DIAGNOSIS:  Stage T2a, N0, Mx Gleason's 7(4+3), PSA 4.35, Adenocarcinoma of the Prostate  NARRATIVE:  The patient was brought to the [facility].  Identity was confirmed.  All relevant records and images related to the planned course of therapy were reviewed.  The patient freely provided informed written consent to proceed with treatment after reviewing the details related to the planned course of therapy. The consent form was witnessed and verified by the simulation staff.  Then, the patient was set-up in a stable reproducible  supine position for radiation therapy.  CT images were obtained.  Surface markings were placed.  The CT images were loaded into the planning software.  Then the target and avoidance structures were contoured.  Treatment planning then occurred.  The radiation prescription was entered and confirmed.  Then, I designed and supervised the construction of a total of 1 medically necessary complex treatment devices.  I have requested : Intensity Modulated Radiotherapy (IMRT) is medically necessary for this case for the following reason:  Rectal sparing..  I have ordered:dose calc.  PLAN:  The patient will receive 78 Gy in 40 fractions.  ________________________________  -----------------------------------  Blair Promise, MD

## 2014-01-03 DIAGNOSIS — Z510 Encounter for antineoplastic radiation therapy: Secondary | ICD-10-CM | POA: Diagnosis not present

## 2014-01-04 DIAGNOSIS — Z510 Encounter for antineoplastic radiation therapy: Secondary | ICD-10-CM | POA: Diagnosis not present

## 2014-01-16 ENCOUNTER — Ambulatory Visit
Admission: RE | Admit: 2014-01-16 | Discharge: 2014-01-16 | Disposition: A | Discharge status: Home / Self Care | Payer: Medicare Other | Source: Ambulatory Visit | Attending: Radiation Oncology | Admitting: Radiation Oncology

## 2014-01-16 ENCOUNTER — Encounter: Payer: Self-pay | Admitting: Radiation Oncology

## 2014-01-16 VITALS — BP 157/63 | HR 60 | Temp 98.0°F | Resp 16 | Ht 65.0 in | Wt 162.9 lb

## 2014-01-16 DIAGNOSIS — C61 Malignant neoplasm of prostate: Secondary | ICD-10-CM

## 2014-01-16 DIAGNOSIS — Z510 Encounter for antineoplastic radiation therapy: Secondary | ICD-10-CM | POA: Diagnosis not present

## 2014-01-16 NOTE — Progress Notes (Signed)
  Radiation Oncology         [phone] ________________________________  Name: Tony Vasquez MRN: [id number]  Date: 01/16/2014  DOB: Dec 13, 1932  Weekly Radiation Therapy Management  DIAGNOSIS: Stage T2a, N0, Mx Gleason's 7(4+3), PSA 4.35, Adenocarcinoma of the Prostate  Current Dose: 1.95 Gy     Planned Dose:  78 Gy  Narrative . . . . . . . . The patient presents for routine under treatment assessment.                                   The patient is without complaint.                                 Set-up films were reviewed.                                 The chart was checked. Physical Findings. . .  height is 5\' 5"  (1.651 m) and weight is 162 lb 14.4 oz (73.891 kg). His oral temperature is 98 F (36.7 C). His blood pressure is 157/63 and his pulse is 60. His respiration is 16. . The lungs are clear. The heart has a regular rhythm and rate. The abdomen is soft nontender with normal bowel sounds. Impression . . . . . . . The patient is tolerating radiation. Plan . . . . . . . . . . . . Continue treatment as planned.  ________________________________   Blair Promise, PhD, MD

## 2014-01-16 NOTE — Progress Notes (Addendum)
Tony Vasquez has completed 1 fractions his prostate.  He does not have any complaints today.  He was given the Radiation Therapy and You book and discussed potential side effects of fatigue, diarrhea, skin irritation and bladder changes.  He was encouraged to call with any questions or concerns.

## 2014-01-17 ENCOUNTER — Ambulatory Visit
Admission: RE | Admit: 2014-01-17 | Discharge: 2014-01-17 | Disposition: A | Discharge status: Home / Self Care | Payer: Medicare Other | Source: Ambulatory Visit | Attending: Radiation Oncology | Admitting: Radiation Oncology

## 2014-01-17 DIAGNOSIS — Z510 Encounter for antineoplastic radiation therapy: Secondary | ICD-10-CM | POA: Diagnosis not present

## 2014-01-18 ENCOUNTER — Ambulatory Visit
Admission: RE | Admit: 2014-01-18 | Discharge: 2014-01-18 | Disposition: A | Discharge status: Home / Self Care | Payer: Medicare Other | Source: Ambulatory Visit | Attending: Radiation Oncology | Admitting: Radiation Oncology

## 2014-01-18 DIAGNOSIS — Z510 Encounter for antineoplastic radiation therapy: Secondary | ICD-10-CM | POA: Diagnosis not present

## 2014-01-19 ENCOUNTER — Ambulatory Visit: Payer: Medicare Other

## 2014-01-22 ENCOUNTER — Ambulatory Visit
Admission: RE | Admit: 2014-01-22 | Discharge: 2014-01-22 | Disposition: A | Discharge status: Home / Self Care | Payer: Medicare Other | Source: Ambulatory Visit | Attending: Radiation Oncology | Admitting: Radiation Oncology

## 2014-01-22 DIAGNOSIS — Z510 Encounter for antineoplastic radiation therapy: Secondary | ICD-10-CM | POA: Diagnosis not present

## 2014-01-23 ENCOUNTER — Ambulatory Visit
Admission: RE | Admit: 2014-01-23 | Discharge: 2014-01-23 | Disposition: A | Discharge status: Home / Self Care | Payer: Medicare Other | Source: Ambulatory Visit | Attending: Radiation Oncology | Admitting: Radiation Oncology

## 2014-01-23 VITALS — BP 153/88 | HR 91 | Temp 98.0°F | Resp 16 | Ht 65.0 in | Wt 165.3 lb

## 2014-01-23 DIAGNOSIS — C61 Malignant neoplasm of prostate: Secondary | ICD-10-CM

## 2014-01-23 DIAGNOSIS — Z510 Encounter for antineoplastic radiation therapy: Secondary | ICD-10-CM | POA: Diagnosis not present

## 2014-01-23 NOTE — Progress Notes (Signed)
  Radiation Oncology         [phone] ________________________________  Name: Tony Vasquez MRN: [id number]  Date: 01/23/2014  DOB: Dec 23, 1932  Weekly Radiation Therapy Management  DIAGNOSIS: Stage T2a, N0, Mx Gleason's 7(4+3), PSA 4.35, Adenocarcinoma of the Prostate  Current Dose: 9.75 Gy     Planned Dose:  78 Gy  Narrative . . . . . . . . The patient presents for routine under treatment assessment.                                   The patient is without complaint.                                 Set-up films were reviewed.                                 The chart was checked. Physical Findings. . .  height is 5\' 5"  (1.651 m) and weight is 165 lb 4.8 oz (74.98 kg). His oral temperature is 98 F (36.7 C). His blood pressure is 153/88 and his pulse is 91. His respiration is 16. . The lungs are clear. The heart has a regular rhythm and rate. The abdomen is soft and nontender with normal bowel sounds. Impression . . . . . . . The patient is tolerating radiation. Plan . . . . . . . . . . . . Continue treatment as planned.  ________________________________   Blair Promise, PhD, MD

## 2014-01-23 NOTE — Progress Notes (Signed)
Tony Vasquez has completed 5 fractions to his prostate.  He denies pain, dysuria, hematuria, urinary frequency, nocturia, diarrhea and fatigue.

## 2014-01-24 ENCOUNTER — Ambulatory Visit
Admission: RE | Admit: 2014-01-24 | Discharge: 2014-01-24 | Disposition: A | Discharge status: Home / Self Care | Payer: Medicare Other | Source: Ambulatory Visit | Attending: Radiation Oncology | Admitting: Radiation Oncology

## 2014-01-24 DIAGNOSIS — Z510 Encounter for antineoplastic radiation therapy: Secondary | ICD-10-CM | POA: Diagnosis not present

## 2014-01-25 ENCOUNTER — Ambulatory Visit
Admission: RE | Admit: 2014-01-25 | Discharge: 2014-01-25 | Disposition: A | Discharge status: Home / Self Care | Payer: Medicare Other | Source: Ambulatory Visit | Attending: Radiation Oncology | Admitting: Radiation Oncology

## 2014-01-25 DIAGNOSIS — Z510 Encounter for antineoplastic radiation therapy: Secondary | ICD-10-CM | POA: Diagnosis not present

## 2014-01-26 ENCOUNTER — Ambulatory Visit
Admission: RE | Admit: 2014-01-26 | Discharge: 2014-01-26 | Disposition: A | Discharge status: Home / Self Care | Payer: Medicare Other | Source: Ambulatory Visit | Attending: Radiation Oncology | Admitting: Radiation Oncology

## 2014-01-26 DIAGNOSIS — Z510 Encounter for antineoplastic radiation therapy: Secondary | ICD-10-CM | POA: Diagnosis not present

## 2014-01-29 ENCOUNTER — Ambulatory Visit
Admission: RE | Admit: 2014-01-29 | Discharge: 2014-01-29 | Disposition: A | Discharge status: Home / Self Care | Payer: Medicare Other | Source: Ambulatory Visit | Attending: Radiation Oncology | Admitting: Radiation Oncology

## 2014-01-29 DIAGNOSIS — Z510 Encounter for antineoplastic radiation therapy: Secondary | ICD-10-CM | POA: Diagnosis not present

## 2014-01-30 ENCOUNTER — Ambulatory Visit
Admission: RE | Admit: 2014-01-30 | Discharge: 2014-01-30 | Disposition: A | Discharge status: Home / Self Care | Payer: Medicare Other | Source: Ambulatory Visit | Attending: Radiation Oncology | Admitting: Radiation Oncology

## 2014-01-30 ENCOUNTER — Encounter: Payer: Self-pay | Admitting: Radiation Oncology

## 2014-01-30 VITALS — BP 145/77 | HR 85 | Temp 98.0°F | Resp 16 | Ht 65.0 in | Wt 165.6 lb

## 2014-01-30 DIAGNOSIS — Z510 Encounter for antineoplastic radiation therapy: Secondary | ICD-10-CM | POA: Diagnosis not present

## 2014-01-30 DIAGNOSIS — C61 Malignant neoplasm of prostate: Secondary | ICD-10-CM

## 2014-01-30 NOTE — Progress Notes (Signed)
  Radiation Oncology         [phone] ________________________________  Name: Tony Vasquez MRN: [id number]  Date: 01/30/2014  DOB: [birth year]  Weekly Radiation Therapy Management  DIAGNOSIS: Stage T2a, N0, Mx Gleason's 7(4+3), PSA 4.35, Adenocarcinoma of the Prostate   Current Dose: 19.5 Gy     Planned Dose:  78 Gy  Narrative . . . . . . . . The patient presents for routine under treatment assessment.                                   The patient is without complaint. No side effects thus far with his treatment.                                 Set-up films were reviewed.                                 The chart was checked. Physical Findings. . .  height is 5\' 5"  (1.651 m) and weight is 165 lb 9.6 oz (75.116 kg). His oral temperature is 98 F (36.7 C). His blood pressure is 145/77 and his pulse is 85. His respiration is 16.  the lungs are clear. The heart has regular rhythm and rate. The abdomen is soft and nontender with normal bowel sounds. Impression . . . . . . . The patient is tolerating radiation. Plan . . . . . . . . . . . . Continue treatment as planned.  ________________________________   Blair Promise, PhD, MD

## 2014-01-30 NOTE — Progress Notes (Signed)
Tony Vasquez has completed 10 fractions to his prostate.  He denies pain, dysuria, hematuria, diarrhea and nocturia.  He reports he is urinating more frequently during the day.  He denies fatigue.

## 2014-01-31 ENCOUNTER — Ambulatory Visit
Admission: RE | Admit: 2014-01-31 | Discharge: 2014-01-31 | Disposition: A | Discharge status: Home / Self Care | Payer: Medicare Other | Source: Ambulatory Visit | Attending: Radiation Oncology | Admitting: Radiation Oncology

## 2014-01-31 DIAGNOSIS — Z510 Encounter for antineoplastic radiation therapy: Secondary | ICD-10-CM | POA: Diagnosis not present

## 2014-02-01 ENCOUNTER — Ambulatory Visit
Admission: RE | Admit: 2014-02-01 | Discharge: 2014-02-01 | Disposition: A | Discharge status: Home / Self Care | Payer: Medicare Other | Source: Ambulatory Visit | Attending: Radiation Oncology | Admitting: Radiation Oncology

## 2014-02-01 DIAGNOSIS — Z510 Encounter for antineoplastic radiation therapy: Secondary | ICD-10-CM | POA: Diagnosis not present

## 2014-02-02 ENCOUNTER — Ambulatory Visit
Admission: RE | Admit: 2014-02-02 | Discharge: 2014-02-02 | Disposition: A | Discharge status: Home / Self Care | Payer: Medicare Other | Source: Ambulatory Visit | Attending: Radiation Oncology | Admitting: Radiation Oncology

## 2014-02-02 DIAGNOSIS — Z510 Encounter for antineoplastic radiation therapy: Secondary | ICD-10-CM | POA: Diagnosis not present

## 2014-02-05 ENCOUNTER — Ambulatory Visit: Payer: Medicare Other

## 2014-02-05 ENCOUNTER — Ambulatory Visit
Admission: RE | Admit: 2014-02-05 | Discharge: 2014-02-05 | Disposition: A | Discharge status: Home / Self Care | Payer: Medicare Other | Source: Ambulatory Visit | Attending: Radiation Oncology | Admitting: Radiation Oncology

## 2014-02-05 DIAGNOSIS — Z510 Encounter for antineoplastic radiation therapy: Secondary | ICD-10-CM | POA: Diagnosis not present

## 2014-02-06 ENCOUNTER — Encounter: Payer: Self-pay | Admitting: Radiation Oncology

## 2014-02-06 ENCOUNTER — Ambulatory Visit
Admission: RE | Admit: 2014-02-06 | Discharge: 2014-02-06 | Disposition: A | Discharge status: Home / Self Care | Payer: Medicare Other | Source: Ambulatory Visit | Attending: Radiation Oncology | Admitting: Radiation Oncology

## 2014-02-06 VITALS — BP 136/61 | HR 82 | Temp 97.6°F | Resp 20 | Ht 65.0 in | Wt 168.7 lb

## 2014-02-06 DIAGNOSIS — C61 Malignant neoplasm of prostate: Secondary | ICD-10-CM

## 2014-02-06 DIAGNOSIS — Z510 Encounter for antineoplastic radiation therapy: Secondary | ICD-10-CM | POA: Diagnosis not present

## 2014-02-06 NOTE — Progress Notes (Signed)
Tony Vasquez has completed 15 fractions to his prostate.  He denies pain, urinary frequency, dysuria, hematuria and nocturia.  He reports he is having more frequent, soft bowel movents.  He reports having 3 bowel movements per day.  He denies fatigue.

## 2014-02-06 NOTE — Progress Notes (Signed)
  Radiation Oncology         [phone] ________________________________  Name: Tony Vasquez MRN: [id number]  Date: 02/06/2014  DOB: 07-09-1932  Weekly Radiation Therapy Management   DIAGNOSIS: Stage T2a, N0, Mx Gleason's 7(4+3), PSA 4.35, Adenocarcinoma of the Prostate  Current Dose: 29.25 Gy     Planned Dose:  78 Gy  Narrative . . . . . . . . The patient presents for routine under treatment assessment.                                   The patient is without complaint. He has noticed slight increase in his bowel frequency but no diarrhea or discomfort.                                 Set-up films were reviewed.                                 The chart was checked. Physical Findings. . .  height is 5\' 5"  (1.651 m) and weight is 168 lb 11.2 oz (76.522 kg). His oral temperature is 97.6 F (36.4 C). His blood pressure is 136/61 and his pulse is 82. His respiration is 20. . The lungs are clear. The heart has a regular rhythm and rate. The abdomen is soft and nontender with normal bowel sounds. Impression . . . . . . . The patient is tolerating radiation. Plan . . . . . . . . . . . . Continue treatment as planned.  ________________________________   Blair Promise, PhD, MD

## 2014-02-07 ENCOUNTER — Ambulatory Visit
Admission: RE | Admit: 2014-02-07 | Discharge: 2014-02-07 | Disposition: A | Discharge status: Home / Self Care | Payer: Medicare Other | Source: Ambulatory Visit | Attending: Radiation Oncology | Admitting: Radiation Oncology

## 2014-02-07 DIAGNOSIS — Z510 Encounter for antineoplastic radiation therapy: Secondary | ICD-10-CM | POA: Diagnosis not present

## 2014-02-08 ENCOUNTER — Ambulatory Visit
Admission: RE | Admit: 2014-02-08 | Discharge: 2014-02-08 | Disposition: A | Discharge status: Home / Self Care | Payer: Medicare Other | Source: Ambulatory Visit | Attending: Radiation Oncology | Admitting: Radiation Oncology

## 2014-02-08 DIAGNOSIS — Z510 Encounter for antineoplastic radiation therapy: Secondary | ICD-10-CM | POA: Diagnosis not present

## 2014-02-12 ENCOUNTER — Ambulatory Visit
Admission: RE | Admit: 2014-02-12 | Discharge: 2014-02-12 | Disposition: A | Discharge status: Home / Self Care | Payer: Medicare Other | Source: Ambulatory Visit | Attending: Radiation Oncology | Admitting: Radiation Oncology

## 2014-02-12 DIAGNOSIS — Z510 Encounter for antineoplastic radiation therapy: Secondary | ICD-10-CM | POA: Diagnosis not present

## 2014-02-13 ENCOUNTER — Ambulatory Visit
Admission: RE | Admit: 2014-02-13 | Discharge: 2014-02-13 | Disposition: A | Discharge status: Home / Self Care | Payer: Medicare Other | Source: Ambulatory Visit | Attending: Radiation Oncology | Admitting: Radiation Oncology

## 2014-02-13 ENCOUNTER — Encounter: Payer: Self-pay | Admitting: Radiation Oncology

## 2014-02-13 VITALS — BP 127/58 | HR 91 | Temp 98.4°F | Resp 70 | Wt 168.0 lb

## 2014-02-13 DIAGNOSIS — Z510 Encounter for antineoplastic radiation therapy: Secondary | ICD-10-CM | POA: Diagnosis not present

## 2014-02-13 DIAGNOSIS — C61 Malignant neoplasm of prostate: Secondary | ICD-10-CM

## 2014-02-13 NOTE — Progress Notes (Signed)
Weekly Management Note Current Dose:  37.05 Gy  Projected Dose:  78 Gy   Narrative:  The patient presents for routine under treatment assessment.  CBCT/MVCT images/Port film x-rays were reviewed.  The chart was checked. Doing well. No complaints. Nocturia x 1.   Physical Findings: Weight: 168 lb (76.204 kg). Unchanged. Alert and oriented.  Impression:  The patient is tolerating radiation.  Plan:  Continue treatment as planned.

## 2014-02-13 NOTE — Progress Notes (Signed)
Weekly rad txs prostate,19/40 completed, no dysuria,hematuria, has frequency when voiding,  ,regular bowel movements,appetite good, no fatigue or nausea,no discomfort or pain 10:59 AM

## 2014-02-14 ENCOUNTER — Ambulatory Visit
Admission: RE | Admit: 2014-02-14 | Discharge: 2014-02-14 | Disposition: A | Discharge status: Home / Self Care | Payer: Medicare Other | Source: Ambulatory Visit | Attending: Radiation Oncology | Admitting: Radiation Oncology

## 2014-02-14 DIAGNOSIS — Z510 Encounter for antineoplastic radiation therapy: Secondary | ICD-10-CM | POA: Diagnosis not present

## 2014-02-15 ENCOUNTER — Ambulatory Visit
Admission: RE | Admit: 2014-02-15 | Discharge: 2014-02-15 | Disposition: A | Discharge status: Home / Self Care | Payer: Medicare Other | Source: Ambulatory Visit | Attending: Radiation Oncology | Admitting: Radiation Oncology

## 2014-02-15 DIAGNOSIS — Z510 Encounter for antineoplastic radiation therapy: Secondary | ICD-10-CM | POA: Diagnosis not present

## 2014-02-19 ENCOUNTER — Ambulatory Visit
Admission: RE | Admit: 2014-02-19 | Discharge: 2014-02-19 | Disposition: A | Discharge status: Home / Self Care | Payer: Medicare Other | Source: Ambulatory Visit | Attending: Radiation Oncology | Admitting: Radiation Oncology

## 2014-02-19 DIAGNOSIS — Z510 Encounter for antineoplastic radiation therapy: Secondary | ICD-10-CM | POA: Diagnosis not present

## 2014-02-20 ENCOUNTER — Encounter: Payer: Self-pay | Admitting: Radiation Oncology

## 2014-02-20 ENCOUNTER — Ambulatory Visit
Admission: RE | Admit: 2014-02-20 | Discharge: 2014-02-20 | Disposition: A | Discharge status: Home / Self Care | Payer: Medicare Other | Source: Ambulatory Visit | Attending: Radiation Oncology | Admitting: Radiation Oncology

## 2014-02-20 VITALS — BP 140/67 | HR 64 | Temp 97.9°F | Resp 16 | Ht 65.0 in | Wt 168.3 lb

## 2014-02-20 DIAGNOSIS — Z510 Encounter for antineoplastic radiation therapy: Secondary | ICD-10-CM | POA: Diagnosis not present

## 2014-02-20 DIAGNOSIS — C61 Malignant neoplasm of prostate: Secondary | ICD-10-CM

## 2014-02-20 NOTE — Progress Notes (Signed)
  Radiation Oncology         [phone] ________________________________  Name: Tony Vasquez MRN: [id number]  Date: 02/20/2014  DOB: 1932-03-26  Weekly Radiation Therapy Management  DIAGNOSIS: Stage T2a, N0, Mx Gleason's 7(4+3), PSA 4.35, Adenocarcinoma of the Prostate  Current Dose: 44.85 Gy     Planned Dose:  78 Gy  Narrative . . . . . . . . The patient presents for routine under treatment assessment.                                   The patient is without complaint. The only change in symptoms is increase in his urinary frequency.                                 Set-up films were reviewed.                                 The chart was checked. Physical Findings. . .  height is 5\' 5"  (1.651 m) and weight is 168 lb 4.8 oz (76.34 kg). His oral temperature is 97.9 F (36.6 C). His blood pressure is 140/67 and his pulse is 64. His respiration is 16. . Weight essentially stable.  No significant changes. Impression . . . . . . . The patient is tolerating radiation. Plan . . . . . . . . . . . . Continue treatment as planned.  ________________________________   Blair Promise, PhD, MD

## 2014-02-20 NOTE — Progress Notes (Signed)
Tony Vasquez has completed 23 fractions to his prostate.  He denies pain.  He reports an increase in urinary frequency.  He denies dysuria, changes in his urinary stream, hematuria, diarrhea and fatigue.   He reports getting up once per night to urinate.

## 2014-02-21 ENCOUNTER — Ambulatory Visit
Admission: RE | Admit: 2014-02-21 | Discharge: 2014-02-21 | Disposition: A | Discharge status: Home / Self Care | Payer: Medicare Other | Source: Ambulatory Visit | Attending: Radiation Oncology | Admitting: Radiation Oncology

## 2014-02-21 DIAGNOSIS — Z510 Encounter for antineoplastic radiation therapy: Secondary | ICD-10-CM | POA: Diagnosis not present

## 2014-02-22 ENCOUNTER — Ambulatory Visit
Admission: RE | Admit: 2014-02-22 | Discharge: 2014-02-22 | Disposition: A | Discharge status: Home / Self Care | Payer: Medicare Other | Source: Ambulatory Visit | Attending: Radiation Oncology | Admitting: Radiation Oncology

## 2014-02-22 DIAGNOSIS — Z510 Encounter for antineoplastic radiation therapy: Secondary | ICD-10-CM | POA: Diagnosis not present

## 2014-02-23 ENCOUNTER — Ambulatory Visit
Admission: RE | Admit: 2014-02-23 | Discharge: 2014-02-23 | Disposition: A | Discharge status: Home / Self Care | Payer: Medicare Other | Source: Ambulatory Visit | Attending: Radiation Oncology | Admitting: Radiation Oncology

## 2014-02-23 DIAGNOSIS — Z510 Encounter for antineoplastic radiation therapy: Secondary | ICD-10-CM | POA: Diagnosis not present

## 2014-02-26 ENCOUNTER — Ambulatory Visit
Admission: RE | Admit: 2014-02-26 | Discharge: 2014-02-26 | Disposition: A | Discharge status: Home / Self Care | Payer: Medicare Other | Source: Ambulatory Visit | Attending: Radiation Oncology | Admitting: Radiation Oncology

## 2014-02-26 DIAGNOSIS — Z510 Encounter for antineoplastic radiation therapy: Secondary | ICD-10-CM | POA: Diagnosis not present

## 2014-02-27 ENCOUNTER — Ambulatory Visit
Admission: RE | Admit: 2014-02-27 | Discharge: 2014-02-27 | Disposition: A | Discharge status: Home / Self Care | Payer: Medicare Other | Source: Ambulatory Visit | Attending: Radiation Oncology | Admitting: Radiation Oncology

## 2014-02-27 ENCOUNTER — Encounter: Payer: Self-pay | Admitting: Radiation Oncology

## 2014-02-27 VITALS — BP 137/69 | HR 83 | Temp 98.0°F | Resp 20 | Wt 166.7 lb

## 2014-02-27 DIAGNOSIS — Z510 Encounter for antineoplastic radiation therapy: Secondary | ICD-10-CM | POA: Diagnosis not present

## 2014-02-27 DIAGNOSIS — C61 Malignant neoplasm of prostate: Secondary | ICD-10-CM

## 2014-02-27 NOTE — Progress Notes (Signed)
  Radiation Oncology         [phone] ________________________________  Name: Tony Vasquez MRN: [id number]  Date: 02/27/2014  DOB: Apr 07, 1932  Weekly Radiation Therapy Management  DIAGNOSIS: Stage T2a, N0, Mx Gleason's 7(4+3), PSA 4.35, Adenocarcinoma of the Prostate  Current Dose: 54.6 Gy     Planned Dose:  78 Gy  Narrative . . . . . . . . The patient presents for routine under treatment assessment.                                   The patient is without complaint. He has had some mild fatigue but is able to carry on his usual activities.                                 Set-up films were reviewed.                                 The chart was checked. Physical Findings. . .  weight is 166 lb 11.2 oz (75.615 kg). His temperature is 98 F (36.7 C). His blood pressure is 137/69 and his pulse is 83. His respiration is 20. . Weight essentially stable.  No significant changes. Impression . . . . . . . The patient is tolerating radiation. Plan . . . . . . . . . . . . Continue treatment as planned.  ________________________________   Blair Promise, PhD, MD

## 2014-02-27 NOTE — Progress Notes (Signed)
Patient denies pain, bowel issues, fatigue, loss of appetite. He reports continued urinary frequency but no more than last week, nocturia x 1, but he denies other urinary issues. He states he "feels weak".

## 2014-02-28 ENCOUNTER — Ambulatory Visit
Admission: RE | Admit: 2014-02-28 | Discharge: 2014-02-28 | Disposition: A | Discharge status: Home / Self Care | Payer: Medicare Other | Source: Ambulatory Visit | Attending: Radiation Oncology | Admitting: Radiation Oncology

## 2014-02-28 DIAGNOSIS — Z510 Encounter for antineoplastic radiation therapy: Secondary | ICD-10-CM | POA: Diagnosis not present

## 2014-03-01 ENCOUNTER — Ambulatory Visit
Admission: RE | Admit: 2014-03-01 | Discharge: 2014-03-01 | Disposition: A | Discharge status: Home / Self Care | Payer: Medicare Other | Source: Ambulatory Visit | Attending: Radiation Oncology | Admitting: Radiation Oncology

## 2014-03-01 DIAGNOSIS — Z510 Encounter for antineoplastic radiation therapy: Secondary | ICD-10-CM | POA: Diagnosis not present

## 2014-03-02 ENCOUNTER — Ambulatory Visit
Admission: RE | Admit: 2014-03-02 | Discharge: 2014-03-02 | Disposition: A | Discharge status: Home / Self Care | Payer: Medicare Other | Source: Ambulatory Visit | Attending: Radiation Oncology | Admitting: Radiation Oncology

## 2014-03-02 DIAGNOSIS — Z510 Encounter for antineoplastic radiation therapy: Secondary | ICD-10-CM | POA: Diagnosis not present

## 2014-03-05 ENCOUNTER — Ambulatory Visit
Admission: RE | Admit: 2014-03-05 | Discharge: 2014-03-05 | Disposition: A | Discharge status: Home / Self Care | Payer: Medicare Other | Source: Ambulatory Visit | Attending: Radiation Oncology | Admitting: Radiation Oncology

## 2014-03-05 DIAGNOSIS — Z510 Encounter for antineoplastic radiation therapy: Secondary | ICD-10-CM | POA: Diagnosis not present

## 2014-03-06 ENCOUNTER — Ambulatory Visit
Admission: RE | Admit: 2014-03-06 | Discharge: 2014-03-06 | Disposition: A | Discharge status: Home / Self Care | Payer: Medicare Other | Source: Ambulatory Visit | Attending: Radiation Oncology | Admitting: Radiation Oncology

## 2014-03-06 ENCOUNTER — Encounter: Payer: Self-pay | Admitting: Radiation Oncology

## 2014-03-06 VITALS — BP 151/73 | HR 95 | Temp 97.7°F | Resp 20 | Ht 65.0 in | Wt 167.5 lb

## 2014-03-06 DIAGNOSIS — C61 Malignant neoplasm of prostate: Secondary | ICD-10-CM

## 2014-03-06 DIAGNOSIS — Z510 Encounter for antineoplastic radiation therapy: Secondary | ICD-10-CM | POA: Diagnosis not present

## 2014-03-06 NOTE — Progress Notes (Signed)
Tony Vasquez has complted 33 fractions to his prostate.  He denies pain, dysuria, hematuria, fatigue, skin irritation and diarrhea.  He reports an increase in urinary frequency andi is going 3-4 times during the day and once per night.    BP 151/73 mmHg  Pulse 95  Temp(Src) 97.7 F (36.5 C) (Oral)  Resp 20  Ht 5\' 5"  (1.651 m)  Wt 167 lb 8 oz (75.978 kg)  BMI 27.87 kg/m2

## 2014-03-06 NOTE — Progress Notes (Signed)
  Radiation Oncology         [phone] ________________________________  Name: Tony Vasquez MRN: [id number]  Date: 03/06/2014  DOB: [birth year]  Weekly Radiation Therapy Management  DIAGNOSIS: Stage T2a, N0, Mx Gleason's 7(4+3), PSA 4.35, Adenocarcinoma of the Prostate   Current Dose: 64.35 Gy     Planned Dose:  78 Gy  Narrative . . . . . . . . The patient presents for routine under treatment assessment.                                   The patient is without complaint. He has noticed slight increase in his urinary frequency during the day and very slight dysuria.                                 Set-up films were reviewed.                                 The chart was checked. Physical Findings. . .  height is 5\' 5"  (1.651 m) and weight is 167 lb 8 oz (75.978 kg). His oral temperature is 97.7 F (36.5 C). His blood pressure is 151/73 and his pulse is 95. His respiration is 20. . Weight essentially stable.  No significant changes. Impression . . . . . . . The patient is tolerating radiation. Plan . . . . . . . . . . . . Continue treatment as planned.  ________________________________   Blair Promise, PhD, MD

## 2014-03-07 ENCOUNTER — Ambulatory Visit
Admission: RE | Admit: 2014-03-07 | Discharge: 2014-03-07 | Disposition: A | Discharge status: Home / Self Care | Payer: Medicare Other | Source: Ambulatory Visit | Attending: Radiation Oncology | Admitting: Radiation Oncology

## 2014-03-07 DIAGNOSIS — Z510 Encounter for antineoplastic radiation therapy: Secondary | ICD-10-CM | POA: Diagnosis not present

## 2014-03-08 ENCOUNTER — Ambulatory Visit
Admission: RE | Admit: 2014-03-08 | Discharge: 2014-03-08 | Disposition: A | Discharge status: Home / Self Care | Payer: Medicare Other | Source: Ambulatory Visit | Attending: Radiation Oncology | Admitting: Radiation Oncology

## 2014-03-08 DIAGNOSIS — Z510 Encounter for antineoplastic radiation therapy: Secondary | ICD-10-CM | POA: Diagnosis not present

## 2014-03-09 ENCOUNTER — Ambulatory Visit: Payer: Medicare Other

## 2014-03-12 ENCOUNTER — Ambulatory Visit
Admission: RE | Admit: 2014-03-12 | Discharge: 2014-03-12 | Disposition: A | Discharge status: Home / Self Care | Payer: Medicare Other | Source: Ambulatory Visit | Attending: Radiation Oncology | Admitting: Radiation Oncology

## 2014-03-12 DIAGNOSIS — Z510 Encounter for antineoplastic radiation therapy: Secondary | ICD-10-CM | POA: Diagnosis not present

## 2014-03-13 ENCOUNTER — Ambulatory Visit
Admission: RE | Admit: 2014-03-13 | Discharge: 2014-03-13 | Disposition: A | Discharge status: Home / Self Care | Payer: Medicare Other | Source: Ambulatory Visit | Attending: Radiation Oncology | Admitting: Radiation Oncology

## 2014-03-13 ENCOUNTER — Encounter: Payer: Self-pay | Admitting: Radiation Oncology

## 2014-03-13 VITALS — BP 135/68 | HR 79 | Temp 97.8°F | Resp 16 | Ht 65.0 in | Wt 165.2 lb

## 2014-03-13 DIAGNOSIS — Z510 Encounter for antineoplastic radiation therapy: Secondary | ICD-10-CM | POA: Diagnosis not present

## 2014-03-13 DIAGNOSIS — C61 Malignant neoplasm of prostate: Secondary | ICD-10-CM

## 2014-03-13 NOTE — Progress Notes (Signed)
Tony Vasquez has completed 37 fractions to his prostate.  He denies pain.  He reports having burning when he starts to urinate.  He reports urrinating more frequently and is getting up 3 times per night to urinate.  He recently started taking Invokana which is making him urinate more.  He denies hematuria, skin irritation and fatigue.  He has been given a one month follow up appointment card.

## 2014-03-13 NOTE — Progress Notes (Signed)
  Radiation Oncology         [phone] ________________________________  Name: Tony Vasquez MRN: [id number]  Date: 03/13/2014  DOB: [birth year]  Weekly Radiation Therapy Management  DIAGNOSIS: Stage T2a, N0, Mx Gleason's 7(4+3), PSA 4.35, Adenocarcinoma of the Prostate  Current Dose: 72.15 Gy     Planned Dose:  78 Gy  Narrative . . . . . . . . The patient presents for routine under treatment assessment.                                   The patient is without complaint. He has noticed some increased urinary frequency but attributes this to her new medication his primary care physician has started. The patient has noticed some slight dysuria but does not request any medication for this issue. He denies any bowel problems.                                 Set-up films were reviewed.                                 The chart was checked. Physical Findings. . .  height is 5\' 5"  (1.651 m) and weight is 165 lb 3.2 oz (74.934 kg). His oral temperature is 97.8 F (36.6 C). His blood pressure is 135/68 and his pulse is 79. His respiration is 16. . Weight essentially stable.  No significant changes. Impression . . . . . . . The patient is tolerating radiation. Plan . . . . . . . . . . . . Continue treatment as planned.  ________________________________   Blair Promise, PhD, MD

## 2014-03-14 ENCOUNTER — Ambulatory Visit
Admission: RE | Admit: 2014-03-14 | Discharge: 2014-03-14 | Disposition: A | Discharge status: Home / Self Care | Payer: Medicare Other | Source: Ambulatory Visit | Attending: Radiation Oncology | Admitting: Radiation Oncology

## 2014-03-14 DIAGNOSIS — Z510 Encounter for antineoplastic radiation therapy: Secondary | ICD-10-CM | POA: Diagnosis not present

## 2014-03-15 ENCOUNTER — Ambulatory Visit
Admission: RE | Admit: 2014-03-15 | Discharge: 2014-03-15 | Disposition: A | Discharge status: Home / Self Care | Payer: Medicare Other | Source: Ambulatory Visit | Attending: Radiation Oncology | Admitting: Radiation Oncology

## 2014-03-15 ENCOUNTER — Ambulatory Visit: Payer: Medicare Other

## 2014-03-15 DIAGNOSIS — Z510 Encounter for antineoplastic radiation therapy: Secondary | ICD-10-CM | POA: Diagnosis not present

## 2014-03-16 ENCOUNTER — Ambulatory Visit: Payer: Medicare Other

## 2014-03-16 ENCOUNTER — Ambulatory Visit
Admission: RE | Admit: 2014-03-16 | Discharge: 2014-03-16 | Disposition: A | Discharge status: Home / Self Care | Payer: Medicare Other | Source: Ambulatory Visit | Attending: Radiation Oncology | Admitting: Radiation Oncology

## 2014-03-16 DIAGNOSIS — Z510 Encounter for antineoplastic radiation therapy: Secondary | ICD-10-CM | POA: Diagnosis not present

## 2014-03-23 ENCOUNTER — Ambulatory Visit: Payer: Medicare Other

## 2014-04-03 ENCOUNTER — Encounter: Payer: Self-pay | Admitting: Radiation Oncology

## 2014-04-03 NOTE — Progress Notes (Signed)
  Radiation Oncology         [phone] ________________________________  Name: Tony Vasquez MRN: [id number]  Date: 04/03/2014  DOB: August 24, 1932  End of Treatment Note  Diagnosis: Stage T2a, N0, Mx Gleason's 7(4+3), PSA 4.35, Adenocarcinoma of the Prostate       Indication for treatment:  Definitive/curative treatment       Radiation treatment dates:   December 1 through January 29  Site/dose:   Prostate 78 gray in 40 fractions  Beams/energy:   IMRT-VMAT, 6X photons  Narrative: The patient tolerated radiation treatment relatively well.   He had minimal urinary symptoms during the course of his treatments. He denied any bowel problems.  Plan: The patient has completed radiation treatment. The patient will return to radiation oncology clinic for routine followup in one month. I advised them to call or return sooner if they have any questions or concerns related to their recovery or treatment.  -----------------------------------  Blair Promise, PhD, MD

## 2014-04-12 ENCOUNTER — Encounter: Payer: Self-pay | Admitting: Radiation Oncology

## 2014-04-12 ENCOUNTER — Ambulatory Visit
Admission: RE | Admit: 2014-04-12 | Discharge: 2014-04-12 | Disposition: A | Discharge status: Home / Self Care | Payer: Medicare Other | Source: Ambulatory Visit | Attending: Radiation Oncology | Admitting: Radiation Oncology

## 2014-04-12 VITALS — BP 146/76 | HR 90 | Temp 97.3°F | Resp 20 | Ht 65.0 in | Wt 163.8 lb

## 2014-04-12 DIAGNOSIS — C61 Malignant neoplasm of prostate: Secondary | ICD-10-CM

## 2014-04-12 NOTE — Progress Notes (Signed)
  Radiation Oncology         [phone]) [phone] ________________________________  Name: Tony Vasquez MRN: [id number]  Date: 04/12/2014  DOB: October 21, 1932  Follow-Up Visit Note  CC: Haywood Pao, MD  [name], [name], MD    ICD-9-CM ICD-10-CM   1. Malignant neoplasm of prostate 185 C61     Diagnosis:Stage T2a, N0, Mx Gleason's 7(4+3), PSA 4.35, Adenocarcinoma of the Prostate      Interval Since Last Radiation:  4  weeks  Narrative:  The patient returns today for routine follow-up.  He is doing quite well at this time. His only side effect is very intermittent mild dysuria.                              ALLERGIES:  is allergic to shrimp and augmentin.  Meds: Current Outpatient Prescriptions  Medication Sig Dispense Refill  . ACCU-CHEK AVIVA PLUS test strip     . Alcohol Swabs (ALCOHOL PREP) 70 % PADS     . ASSURE COMFORT LANCETS 30G MISC     . Blood Glucose Calibration (ACCU-CHEK AVIVA) SOLN     . canagliflozin (INVOKANA) 100 MG TABS tablet Take 100 mg by mouth daily.    . furosemide (LASIX) 20 MG tablet Take 20 mg by mouth every morning.     Marland Kitchen glipiZIDE (GLUCOTROL) 5 MG tablet Take 5 mg by mouth 2 (two) times daily before a meal.    . Lancet Devices (ADJUSTABLE LANCING DEVICE) MISC     . sitaGLIPtin (JANUVIA) 100 MG tablet Take 50 mg by mouth every morning.     No current facility-administered medications for this encounter.    Physical Findings: The patient is in no acute distress. Patient is alert and oriented.  height is 5\' 5"  (1.651 m) and weight is 163 lb 12.8 oz (74.299 kg). His oral temperature is 97.3 F (36.3 C). His blood pressure is 146/76 and his pulse is 90. His respiration is 20. Marland Kitchen  No significant changes.  Lab Findings: Lab Results  Component Value Date   WBC 6.7 11/02/2012   HGB 15.3 11/21/2013   HCT 45.0 11/21/2013   MCV 87.8 11/02/2012   PLT 212 11/02/2012    Radiographic Findings: No results found.  Impression:  The patient is recovering  from the effects of radiation.    Plan:  Routine follow-up in 6 months. Patient is scheduled for follow-up with urology in March  ____________________________________ Blair Promise, MD

## 2014-04-12 NOTE — Progress Notes (Signed)
[facility] here for follow up after treatment for prostate cancer.  He denies pain, diarrhea, fatigue and hematuria.  He reports urinary frequency from his medications.  He also reports dysuria occasionally.  He gets up 2 times per night to urinate.  BP 146/76 mmHg  Pulse 90  Temp(Src) 97.3 F (36.3 C) (Oral)  Resp 20  Ht 5\' 5"  (1.651 m)  Wt 163 lb 12.8 oz (74.299 kg)  BMI 27.26 kg/m2

## 2014-10-04 ENCOUNTER — Encounter: Payer: Self-pay | Admitting: Radiation Oncology

## 2014-10-04 ENCOUNTER — Ambulatory Visit
Admission: RE | Admit: 2014-10-04 | Discharge: 2014-10-04 | Disposition: A | Discharge status: Home / Self Care | Payer: Medicare Other | Source: Ambulatory Visit | Attending: Radiation Oncology | Admitting: Radiation Oncology

## 2014-10-04 VITALS — BP 144/76 | HR 96 | Temp 98.6°F | Resp 20 | Ht 65.0 in | Wt 176.6 lb

## 2014-10-04 DIAGNOSIS — C61 Malignant neoplasm of prostate: Secondary | ICD-10-CM

## 2014-10-04 NOTE — Progress Notes (Signed)
Follow up s/p radiation tx prostate 01/17/15-03/16/14, no dysuria, no hematuria, only increased frequency voiding, regular bowel movements, no c/o  Pain Appetite good ,  Saw  Dr. Jeffie Pollock in June 2016,next appt in December    BP 144/76 mmHg  Pulse 96  Temp(Src) 98.6 F (37 C) (Oral)  Resp 20  Ht 5\' 5"  (1.651 m)  Wt 176 lb 9.6 oz (80.105 kg)  BMI 29.39 kg/m2 Wt Readings from Last 3 Encounters:  10/04/14 176 lb 9.6 oz (80.105 kg)  04/12/14 163 lb 12.8 oz (74.299 kg)  03/13/14 165 lb 3.2 oz (74.934 kg)

## 2014-10-04 NOTE — Progress Notes (Signed)
Radiation Oncology         [phone]) [phone] ________________________________  Name: Tony Vasquez MRN: [id number]  Date: 10/04/2014  DOB: 1932-07-19  Follow-Up Visit Note  CC: Tony Pao, MD  Irine Seal, MD    ICD-9-CM ICD-10-CM   1. Malignant neoplasm of prostate 185 C61     Diagnosis: Tony Vasquez is a 81 year old male presenting to clinic in regards to his Stage T2a, N0, Mx Gleason's 7(4+3) PSA 4.35 adenocarcinoma of the prostate.      Interval Since Last Radiation:  7 months  Narrative:  The patient returns today for routine follow-up.  He is doing quite well at this time. The patient had an appointment with Dr. Jeffie Pollock in June of 2016 was made aware of his .02 PSA level. He reports changing his pad once a day due to "leaking" and "not getting to the bathroom in time." He also reports increased frequency. He denies blood in the urine, blood in the stools or rectal bleeding. Otherwise, the patient is without complaint. The patient projected a healthy mental status and was accompanied by family for today's radiation oncology appointment.                   ALLERGIES:  is allergic to shrimp and augmentin.  Meds: Current Outpatient Prescriptions  Medication Sig Dispense Refill  . ACCU-CHEK AVIVA PLUS test strip     . Alcohol Swabs (ALCOHOL PREP) 70 % PADS     . ASSURE COMFORT LANCETS 30G MISC     . Blood Glucose Calibration (ACCU-CHEK AVIVA) SOLN     . Calcium Carb-Cholecalciferol (CALCIUM + D3) 600-200 MG-UNIT TABS Take 1 tablet by mouth daily.    . canagliflozin (INVOKANA) 100 MG TABS tablet Take 100 mg by mouth daily.    Marland Kitchen LANTUS SOLOSTAR 100 UNIT/ML Solostar Pen Inject 20 Units into the skin every morning.    . sitaGLIPtin (JANUVIA) 100 MG tablet Take 50 mg by mouth every morning.    . furosemide (LASIX) 20 MG tablet Take 20 mg by mouth every morning.     Marland Kitchen glipiZIDE (GLUCOTROL) 5 MG tablet Take 5 mg by mouth 2 (two) times daily before a meal.    . Lancet Devices  (ADJUSTABLE LANCING DEVICE) MISC     . NOVOFINE 32G X 6 MM MISC      No current facility-administered medications for this encounter.    Physical Findings:  height is 5\' 5"  (1.651 m) and weight is 176 lb 9.6 oz (80.105 kg). His oral temperature is 98.6 F (37 C). His blood pressure is 144/76 and his pulse is 96. His respiration is 20.  Lungs are clear, heart has regular rate and rhythm with no palpable cervical, supraclavicular, or axillary adenopathy. The abdomen is soft and nontender with normal bowel sounds. The patient is in no acute distress and is alert and oriented x3.   Lab Findings: Lab Results  Component Value Date   WBC 6.7 11/02/2012   HGB 15.3 11/21/2013   HCT 45.0 11/21/2013   MCV 87.8 11/02/2012   PLT 212 11/02/2012    Radiographic Findings: No results found.  Impression: Tony Vasquez is a 81 year old male presenting to clinic in regards to his Stage T2a, N0, Mx Gleason's 7(4+3) PSA 4.35 adenocarcinoma of the prostate. The patient's PSA is excellent at this time however he continues to be at castrate levels concerning his testosterone. He will continue with urology follow-up every 6 months  Plan: The  patient is aware of his upcoming appointment with Dr. Jeffie Pollock and aware of his last reported PSA levels. The patient is advised of his routine follow-up appointment with radiation oncology to take place as needed. All questions and concerns vocalized have been fully addressed. If the patient develops any further questions or concerns, he has been encouraged to contact Dr. Sondra Come, MD, PhD.   This document serves as a record of services personally performed by Gery Pray, MD. It was created on his behalf by Lenn Cal, a trained medical scribe. The creation of this record is based on the scribe's personal observations and the provider's statements to them. This document has been checked and approved by the attending provider.     ____________________________________  Blair Promise, PhD, MD

## 2016-09-17 ENCOUNTER — Emergency Department (HOSPITAL_COMMUNITY): Payer: Medicare Other

## 2016-09-17 ENCOUNTER — Emergency Department (HOSPITAL_COMMUNITY)
Admission: EM | Admit: 2016-09-17 | Discharge: 2016-09-18 | Disposition: A | Discharge status: Home / Self Care | Payer: Medicare Other | Attending: Emergency Medicine | Admitting: Emergency Medicine

## 2016-09-17 ENCOUNTER — Encounter (HOSPITAL_COMMUNITY): Payer: Self-pay | Admitting: Emergency Medicine

## 2016-09-17 DIAGNOSIS — Z880 Allergy status to penicillin: Secondary | ICD-10-CM | POA: Insufficient documentation

## 2016-09-17 DIAGNOSIS — R410 Disorientation, unspecified: Secondary | ICD-10-CM | POA: Insufficient documentation

## 2016-09-17 DIAGNOSIS — R739 Hyperglycemia, unspecified: Secondary | ICD-10-CM

## 2016-09-17 DIAGNOSIS — E119 Type 2 diabetes mellitus without complications: Secondary | ICD-10-CM | POA: Diagnosis present

## 2016-09-17 DIAGNOSIS — Z91013 Allergy to seafood: Secondary | ICD-10-CM | POA: Diagnosis not present

## 2016-09-17 DIAGNOSIS — Z794 Long term (current) use of insulin: Secondary | ICD-10-CM | POA: Diagnosis not present

## 2016-09-17 DIAGNOSIS — N189 Chronic kidney disease, unspecified: Secondary | ICD-10-CM | POA: Insufficient documentation

## 2016-09-17 DIAGNOSIS — I129 Hypertensive chronic kidney disease with stage 1 through stage 4 chronic kidney disease, or unspecified chronic kidney disease: Secondary | ICD-10-CM | POA: Insufficient documentation

## 2016-09-17 LAB — URINALYSIS, ROUTINE W REFLEX MICROSCOPIC
Bacteria, UA: NONE SEEN
Bilirubin Urine: NEGATIVE
Glucose, UA: >=500 mg/dL — AB
Hgb urine dipstick: NEGATIVE
KETONES UR: NEGATIVE mg/dL
LEUKOCYTES UA: NEGATIVE
Nitrite: NEGATIVE
PROTEIN: NEGATIVE mg/dL
SQUAMOUS EPITHELIAL / LPF: NONE SEEN
Specific Gravity, Urine: 1.023 (ref 1.005–1.030)
pH: 5 (ref 5.0–8.0)

## 2016-09-17 LAB — BASIC METABOLIC PANEL
Anion gap: 10 (ref 5–15)
BUN: 25 mg/dL — ABNORMAL HIGH (ref 6–20)
CHLORIDE: 98 mmol/L — AB (ref 101–111)
CO2: 24 mmol/L (ref 22–32)
Calcium: 9 mg/dL (ref 8.9–10.3)
Creatinine, Ser: 1.93 mg/dL — ABNORMAL HIGH (ref 0.61–1.24)
GFR calc Af Amer: 36 mL/min — ABNORMAL LOW (ref >60)
GFR calc non Af Amer: 31 mL/min — ABNORMAL LOW (ref >60)
Glucose, Bld: 382 mg/dL — ABNORMAL HIGH (ref 65–99)
POTASSIUM: 4 mmol/L (ref 3.5–5.1)
SODIUM: 132 mmol/L — AB (ref 135–145)

## 2016-09-17 LAB — CBC
HCT: 47.1 % (ref 39.0–52.0)
HEMOGLOBIN: 16.2 g/dL (ref 13.0–17.0)
MCH: 30.5 pg (ref 26.0–34.0)
MCHC: 34.4 g/dL (ref 30.0–36.0)
MCV: 88.5 fL (ref 78.0–100.0)
Platelets: 143 10*3/uL — ABNORMAL LOW (ref 150–400)
RBC: 5.32 MIL/uL (ref 4.22–5.81)
RDW: 13 % (ref 11.5–15.5)
WBC: 7.5 10*3/uL (ref 4.0–10.5)

## 2016-09-17 LAB — CBG MONITORING, ED
Glucose-Capillary: 390 mg/dL — ABNORMAL HIGH (ref 65–99)
Glucose-Capillary: 319 mg/dL — ABNORMAL HIGH (ref 65–99)

## 2016-09-17 MED ORDER — SODIUM CHLORIDE 0.9 % IV BOLUS (SEPSIS)
1000.0000 mL | Freq: Once | INTRAVENOUS | Status: AC
Start: 2016-09-17 — End: 2016-09-18
  Administered 2016-09-18: 1000 mL via INTRAVENOUS

## 2016-09-17 NOTE — ED Notes (Signed)
Patient transported to CT 

## 2016-09-17 NOTE — ED Notes (Signed)
CBG 390

## 2016-09-17 NOTE — ED Notes (Signed)
ED Provider at bedside. 

## 2016-09-17 NOTE — ED Triage Notes (Signed)
Per pt, blood sugar at home was 461. Does take insulin, denies missing any doses.

## 2016-09-18 LAB — ETHANOL

## 2016-09-18 LAB — TROPONIN I

## 2016-09-18 NOTE — ED Provider Notes (Signed)
[facility] DEPT Provider Note   CSN: [id number] Arrival date & time: 09/17/16  1920     History   Chief Complaint Chief Complaint  Patient presents with  . Hyperglycemia    HPI Tony Vasquez is a 83 y.o. male.  The history is provided by the patient and a relative.  Hyperglycemia  Severity:  Moderate Onset quality:  Gradual Duration:  1 day Timing:  Constant Progression:  Worsening Chronicity:  New Relieved by:  None tried Ineffective treatments:  None tried Associated symptoms: no chest pain, no fever, no shortness of breath and no vomiting   Pt with h/o diabetes who presents with hyperglycemia for past day No change in meds and reports compliance but glucose has been elevated which is unusual Denies fever/vomiting/diarrhea No HA No falls No CP/SOB  Daughter at bedside, reports that pt lives her brother Apparently, has been more confused for past several days (at least 3 days) He has been more forgetful with mild confusion No focal weakness to suggest stroke He has been sleeping more than usual There has been dizziness reported but no falls   Past Medical History:  Diagnosis Date  . At risk for sleep apnea    STOP-BANG= 4    SENT TO PCP 11-16-2013  . Bilateral renal cysts   . Dyslipidemia   . Hydronephrosis of left kidney   . Hypertension   . Mild renal insufficiency   . OA (osteoarthritis)   . Prostate cancer ([place])   . Radiation 01/16/14-03/16/14   Prostate 78 gray in 40 fractions  . Type 2 diabetes mellitus [facility])     Patient Active Problem List   Diagnosis Date Noted  . Malignant neoplasm of prostate ([place]) 01/16/2014  . Secondary renovascular hypertension, benign 12/12/2012  . Chronic kidney disease, unspecified 12/12/2012  . Type II or unspecified type diabetes mellitus with renal manifestations, not stated as uncontrolled(250.40) 12/12/2012  . Osteoarthrosis, unspecified whether generalized or localized, unspecified site 11/24/2012  . Acute  bronchitis 11/24/2012  . Fever 10/31/2012  . Rash 10/31/2012  . Intractable hiccups 10/31/2012  . Renal insufficiency, mild 10/31/2012  . Thrombocytopenia ([place]) 10/31/2012  . Normocytic anemia 10/31/2012  . DM (diabetes mellitus) ([place]) 10/31/2012  . HTN (hypertension) 10/31/2012  . Dyslipidemia 10/31/2012    Past Surgical History:  Procedure Laterality Date  . CATARACT EXTRACTION W/ INTRAOCULAR LENS IMPLANT Left   . CYSTOSCOPY W/ RETROGRADES Left 11/21/2013   Procedure: LEFT CYSTOSCOPY WITH RETROGRADE PYELOGRAM;  Surgeon: Malka So, MD;  Location: [facility];  Service: Urology;  Laterality: Left;  . INGUINAL HERNIA REPAIR Left 2000  . PROSTATE BIOPSY N/A 11/21/2013   Procedure: BIOPSY TRANSRECTAL ULTRASONIC PROSTATE (TUBP);  Surgeon: Malka So, MD;  Location[facility];  Service: Urology;  Laterality: N/A;  . TONSILECTOMY/ADENOIDECTOMY WITH MYRINGOTOMY         Home Medications    Prior to Admission medications   Medication Sig Start Date End Date Taking? Authorizing Provider  ACCU-CHEK AVIVA PLUS test strip  10/26/13   [provider]  Alcohol Swabs (ALCOHOL PREP) 70 % PADS  10/26/13   [provider]  ASSURE COMFORT LANCETS 30G MISC  10/26/13   [provider]  Blood Glucose Calibration (ACCU-CHEK AVIVA) SOLN  10/26/13   [provider]  Calcium Carb-Cholecalciferol (CALCIUM + D3) 600-200 MG-UNIT TABS Take 1 tablet by mouth daily.    [provider]  canagliflozin (INVOKANA) 100 MG TABS tablet Take 100 mg  by mouth daily.    [provider]  furosemide (LASIX) 20 MG tablet Take 20 mg by mouth every morning.     [provider]  glipiZIDE (GLUCOTROL) 5 MG tablet Take 5 mg by mouth 2 (two) times daily before a meal.    [provider]  Lancet Devices (ADJUSTABLE LANCING DEVICE) [place]  10/26/13   [provider]  LANTUS SOLOSTAR 100 UNIT/ML Solostar Pen Inject 20 Units  into the skin every morning. 09/25/14   [provider]  NOVOFINE 32G X 6 MM MISC  09/27/14   [provider]  sitaGLIPtin (JANUVIA) 100 MG tablet Take 50 mg by mouth every morning.    [provider]    Family History Family History  Problem Relation Age of Onset  . Prostate cancer Father     Social History Social History  Substance Use Topics  . Smoking status: Never Smoker  . Smokeless tobacco: Never Used  . Alcohol use No     Allergies   Shrimp [shellfish allergy] and Augmentin [amoxicillin-pot clavulanate]   Review of Systems Review of Systems  Constitutional: Negative for fever.  Respiratory: Negative for shortness of breath.   Cardiovascular: Negative for chest pain.  Gastrointestinal: Negative for diarrhea and vomiting.  Neurological: Negative for syncope and headaches.  All other systems reviewed and are negative.    Physical Exam Updated Vital Signs BP 131/90   Pulse (!) 57   Temp 98 F (36.7 C) (Oral)   Resp 16   SpO2 99%   Physical Exam CONSTITUTIONAL: Elderly, smiling, no acute distress HEAD: Normocephalic/atraumatic EYES: EOMI/PERRL, no nystagmus, no visual field deficit   ENMT: Mucous membranes moist NECK: supple no meningeal signs, no bruits CV: S1/S2 noted, no murmurs/rubs/gallops noted LUNGS: Lungs are clear to auscultation bilaterally, no apparent distress ABDOMEN: soft, nontender, no rebound or guarding GU:no cva tenderness NEURO:Awake/alert, face symmetric, no arm or leg drift is noted Cranial nerves 3/4/5/6/08/24/08/11/12 tested and intact No past pointing Sensation to light touch intact in all extremities Pt is oriented to person/place/birthdate He can recall family members He is unsure of current year EXTREMITIES: pulses normal, full ROM SKIN: warm, color normal PSYCH: no abnormalities of mood noted   ED Treatments / Results  Labs (all labs ordered are listed, but only abnormal results are  displayed) Labs Reviewed  BASIC METABOLIC PANEL - Abnormal; Notable for the following:       Result Value   Sodium 132 (*)    Chloride 98 (*)    Glucose, Bld 382 (*)    BUN 25 (*)    Creatinine, Ser 1.93 (*)    GFR calc non Af Amer 31 (*)    GFR calc Af Amer 36 (*)    All other components within normal limits  CBC - Abnormal; Notable for the following:    Platelets 143 (*)    All other components within normal limits  URINALYSIS, ROUTINE W REFLEX MICROSCOPIC - Abnormal; Notable for the following:    Glucose, UA >=500 (*)    All other components within normal limits  CBG MONITORING, ED - Abnormal; Notable for the following:    Glucose-Capillary 390 (*)    All other components within normal limits  CBG MONITORING, ED - Abnormal; Notable for the following:    Glucose-Capillary 319 (*)    All other components within normal limits  ETHANOL  TROPONIN I    EKG  EKG Interpretation  Date/Time:  Thursday September 17 2016 23:14:37  EDT Ventricular Rate:  59 PR Interval:    QRS Duration: 115 QT Interval:  478 QTC Calculation: 474 R Axis:   -41 Text Interpretation:  Sinus rhythm Incomplete RBBB and LAFB T wave abnormality Confirmed by Ripley Fraise [phone]) on 09/17/2016 11:51:32 PM       Radiology Ct Head Wo Contrast  Result Date: 09/17/2016 CLINICAL DATA:  Initial evaluation for acute confusion. EXAM: CT HEAD WITHOUT CONTRAST TECHNIQUE: Contiguous axial images were obtained from the base of the skull through the vertex without intravenous contrast. COMPARISON:  Prior CT from 05/14/2011. FINDINGS: Brain: Generalized age-related cerebral atrophy with moderate chronic small vessel ischemic disease, mildly progressed from previous. No acute intracranial hemorrhage. No evidence for acute large vessel territory infarct. No mass lesion, midline shift or mass effect. Ventricular prominence related to global parenchymal volume loss of hydrocephalus, similar to prior. No extra-axial fluid  collection. Vascular: No asymmetric hyperdense vessel. Scattered vascular calcifications noted within the carotid siphons. Skull: Scalp soft tissues within normal limits.  Calvarium intact. Sinuses/Orbits: Globes and orbital soft tissues within normal limits. Patient status post lens extraction on the left. Paranasal sinuses are clear. No mastoid effusion. IMPRESSION: 1. No acute intracranial process identified. 2. Moderate cerebral atrophy with chronic small vessel ischemic disease, mildly progressed relative to 2013. Electronically Signed   By: Jeannine Boga M.D.   On: 09/17/2016 23:58    Procedures Procedures    Medications Ordered in ED Medications  sodium chloride 0.9 % bolus 1,000 mL (1,000 mLs Intravenous New Bag/Given 09/18/16 0030)     Initial Impression / Assessment and Plan / ED Course  I have reviewed the triage vital signs and the nursing notes.  Pertinent labs results that were available during my care of the patient were reviewed by me and considered in my medical decision making (see chart for details).     Pt in the ED for increased confusion and hyperglycemia His glucose is improving He is awake/alert No focal weakness He is ambulatory   I spoke to son who lives with patient He has baseline confusion for awhile, though worse after past several days He likely has dementia that has been exacerbated by his hyperglycemia Son report pt goes to adult daycare and will often eat sugary foods  Overall pt improved Family/son feels comfortable taking patient home They decline home health referral BP (!) 145/70   Pulse (!) 51   Temp 98 F (36.7 C) (Oral)   Resp 15   SpO2 99% \   Final Clinical Impressions(s) / ED Diagnoses   Final diagnoses:  Hyperglycemia  Confusion    New Prescriptions New Prescriptions   No medications on file     Ripley Fraise, MD 09/18/16 [phone]

## 2016-09-18 NOTE — ED Notes (Signed)
Pt ambulated well with no assistance. Had to change pt's diaper x's 2 but pt was able to assist.

## 2017-08-22 ENCOUNTER — Observation Stay (HOSPITAL_COMMUNITY)
Admission: EM | Admit: 2017-08-22 | Discharge: 2017-08-25 | Disposition: A | Discharge status: Home / Self Care | Payer: Medicare Other | Attending: Internal Medicine | Admitting: Internal Medicine

## 2017-08-22 ENCOUNTER — Encounter (HOSPITAL_COMMUNITY): Payer: Self-pay | Admitting: Emergency Medicine

## 2017-08-22 ENCOUNTER — Emergency Department (HOSPITAL_COMMUNITY): Payer: Medicare Other

## 2017-08-22 DIAGNOSIS — Z880 Allergy status to penicillin: Secondary | ICD-10-CM | POA: Diagnosis not present

## 2017-08-22 DIAGNOSIS — R066 Hiccough: Secondary | ICD-10-CM

## 2017-08-22 DIAGNOSIS — Z79899 Other long term (current) drug therapy: Secondary | ICD-10-CM | POA: Insufficient documentation

## 2017-08-22 DIAGNOSIS — D696 Thrombocytopenia, unspecified: Secondary | ICD-10-CM

## 2017-08-22 DIAGNOSIS — F0390 Unspecified dementia without behavioral disturbance: Secondary | ICD-10-CM | POA: Diagnosis not present

## 2017-08-22 DIAGNOSIS — Z794 Long term (current) use of insulin: Secondary | ICD-10-CM | POA: Insufficient documentation

## 2017-08-22 DIAGNOSIS — N183 Chronic kidney disease, stage 3 (moderate): Secondary | ICD-10-CM | POA: Diagnosis not present

## 2017-08-22 DIAGNOSIS — E162 Hypoglycemia, unspecified: Secondary | ICD-10-CM

## 2017-08-22 DIAGNOSIS — E1122 Type 2 diabetes mellitus with diabetic chronic kidney disease: Secondary | ICD-10-CM | POA: Diagnosis not present

## 2017-08-22 DIAGNOSIS — R2681 Unsteadiness on feet: Secondary | ICD-10-CM | POA: Diagnosis not present

## 2017-08-22 DIAGNOSIS — E785 Hyperlipidemia, unspecified: Secondary | ICD-10-CM | POA: Insufficient documentation

## 2017-08-22 DIAGNOSIS — E11649 Type 2 diabetes mellitus with hypoglycemia without coma: Secondary | ICD-10-CM | POA: Diagnosis not present

## 2017-08-22 DIAGNOSIS — Z8546 Personal history of malignant neoplasm of prostate: Secondary | ICD-10-CM | POA: Diagnosis not present

## 2017-08-22 DIAGNOSIS — N289 Disorder of kidney and ureter, unspecified: Secondary | ICD-10-CM

## 2017-08-22 DIAGNOSIS — I129 Hypertensive chronic kidney disease with stage 1 through stage 4 chronic kidney disease, or unspecified chronic kidney disease: Secondary | ICD-10-CM | POA: Insufficient documentation

## 2017-08-22 DIAGNOSIS — E0800 Diabetes mellitus due to underlying condition with hyperosmolarity without nonketotic hyperglycemic-hyperosmolar coma (NKHHC): Secondary | ICD-10-CM

## 2017-08-22 DIAGNOSIS — R001 Bradycardia, unspecified: Secondary | ICD-10-CM

## 2017-08-22 LAB — URINALYSIS, ROUTINE W REFLEX MICROSCOPIC
BACTERIA UA: NONE SEEN
Bilirubin Urine: NEGATIVE
Glucose, UA: 50 mg/dL — AB
KETONES UR: NEGATIVE mg/dL
LEUKOCYTES UA: NEGATIVE
Nitrite: NEGATIVE
PROTEIN: NEGATIVE mg/dL
Specific Gravity, Urine: 1.01 (ref 1.005–1.030)
pH: 5 (ref 5.0–8.0)

## 2017-08-22 LAB — CBC WITH DIFFERENTIAL/PLATELET
ABS IMMATURE GRANULOCYTES: 0.1 10*3/uL (ref 0.0–0.1)
BASOS PCT: 0 %
Basophils Absolute: 0 10*3/uL (ref 0.0–0.1)
EOS PCT: 0 %
Eosinophils Absolute: 0 10*3/uL (ref 0.0–0.7)
HCT: 48.5 % (ref 39.0–52.0)
HEMOGLOBIN: 15.6 g/dL (ref 13.0–17.0)
Immature Granulocytes: 1 %
Lymphocytes Relative: 9 %
Lymphs Abs: 1 10*3/uL (ref 0.7–4.0)
MCH: 30.2 pg (ref 26.0–34.0)
MCHC: 32.2 g/dL (ref 30.0–36.0)
MCV: 93.8 fL (ref 78.0–100.0)
MONO ABS: 0.6 10*3/uL (ref 0.1–1.0)
MONOS PCT: 6 %
NEUTROS ABS: 9 10*3/uL — AB (ref 1.7–7.7)
Neutrophils Relative %: 84 %
PLATELETS: 141 10*3/uL — AB (ref 150–400)
RBC: 5.17 MIL/uL (ref 4.22–5.81)
RDW: 12.3 % (ref 11.5–15.5)
WBC: 10.8 10*3/uL — ABNORMAL HIGH (ref 4.0–10.5)

## 2017-08-22 LAB — CBG MONITORING, ED
GLUCOSE-CAPILLARY: 116 mg/dL — AB (ref 70–99)
GLUCOSE-CAPILLARY: 107 mg/dL — AB (ref 70–99)
Glucose-Capillary: 100 mg/dL — ABNORMAL HIGH (ref 70–99)
Glucose-Capillary: 32 mg/dL — CL (ref 70–99)

## 2017-08-22 LAB — BASIC METABOLIC PANEL
ANION GAP: 11 (ref 5–15)
BUN: 32 mg/dL — AB (ref 8–23)
CO2: 22 mmol/L (ref 22–32)
Calcium: 8.9 mg/dL (ref 8.9–10.3)
Chloride: 102 mmol/L (ref 98–111)
Creatinine, Ser: 2.06 mg/dL — ABNORMAL HIGH (ref 0.61–1.24)
GFR calc Af Amer: 33 mL/min — ABNORMAL LOW (ref >60)
GFR calc non Af Amer: 28 mL/min — ABNORMAL LOW (ref >60)
GLUCOSE: 34 mg/dL — AB (ref 70–99)
Potassium: 4 mmol/L (ref 3.5–5.1)
Sodium: 135 mmol/L (ref 135–145)

## 2017-08-22 LAB — I-STAT TROPONIN, ED: Troponin i, poc: 0.01 ng/mL (ref 0.00–0.08)

## 2017-08-22 MED ORDER — DEXTROSE 50 % IV SOLN
INTRAVENOUS | Status: AC
  Filled 2017-08-22: qty 50

## 2017-08-22 NOTE — ED Notes (Signed)
Patient given orange juice and graham crackers. Tolerating well.

## 2017-08-22 NOTE — ED Triage Notes (Signed)
Pt pulled out of car by NF, pt lethargic and hard to arouse. CBG noted to be 30 in triage. IV started and given amp of of D50 and taken to room.

## 2017-08-22 NOTE — ED Provider Notes (Signed)
[facility] EMERGENCY DEPARTMENT Provider Note   CSN: [id number] Arrival date & time: 08/22/17  1938     History   Chief Complaint Chief Complaint  Patient presents with  . Lethargic    HPI Tony Vasquez is a 84 y.o. male with PMH/o HTN, DM who presents for evaluation of legthargy. Patient brought in by family members they did an approximate 1 hour prior to ED arrival, patient started becoming altered, lethargic, diaphoretic.  They report that patient had been in his normal state of health prior to onset of symptoms.  They stated that he was hard to arouse and was not making sense when he was talking to him.  They state that he has a history of diabetes and does take insulin.  He took his normal dose of insulin today.  He has been eating and drinking properly.  Denies any nausea/vomiting.  On ED arrival, they report that he looks somewhat improved but still appears to be slightly lethargic.  Patient states he is having some intermittent blurry vision.  Denies any pain at this time.  The patient and family denied fever, chest pain, difficulty breathing, abdominal pain, numbness/weakness of his arms or legs.  The history is provided by the patient.    Past Medical History:  Diagnosis Date  . At risk for sleep apnea    STOP-BANG= 4    SENT TO PCP 11-16-2013  . Bilateral renal cysts   . Dyslipidemia   . Hydronephrosis of left kidney   . Hypertension   . Mild renal insufficiency   . OA (osteoarthritis)   . Prostate cancer ([place])   . Radiation 01/16/14-03/16/14   Prostate 78 gray in 40 fractions  . Type 2 diabetes mellitus [facility])     Patient Active Problem List   Diagnosis Date Noted  . Hypoglycemia 08/23/2017  . Malignant neoplasm of prostate ([place]) 01/16/2014  . Secondary renovascular hypertension, benign 12/12/2012  . Chronic kidney disease, unspecified 12/12/2012  . Type II or unspecified type diabetes mellitus with renal manifestations, not stated as  uncontrolled(250.40) 12/12/2012  . Osteoarthrosis, unspecified whether generalized or localized, unspecified site 11/24/2012  . Acute bronchitis 11/24/2012  . Fever 10/31/2012  . Rash 10/31/2012  . Intractable hiccups 10/31/2012  . Renal insufficiency, mild 10/31/2012  . Thrombocytopenia ([place]) 10/31/2012  . Normocytic anemia 10/31/2012  . DM (diabetes mellitus) ([place]) 10/31/2012  . HTN (hypertension) 10/31/2012  . Dyslipidemia 10/31/2012    Past Surgical History:  Procedure Laterality Date  . CATARACT EXTRACTION W/ INTRAOCULAR LENS IMPLANT Left   . CYSTOSCOPY W/ RETROGRADES Left 11/21/2013   Procedure: LEFT CYSTOSCOPY WITH RETROGRADE PYELOGRAM;  Surgeon: Malka So, MD;  Location[facility];  Service: Urology;  Laterality: Left;  . INGUINAL HERNIA REPAIR Left 2000  . PROSTATE BIOPSY N/A 11/21/2013   Procedure: BIOPSY TRANSRECTAL ULTRASONIC PROSTATE (TUBP);  Surgeon: Malka So, MD;  Location: [facility];  Service: Urology;  Laterality: N/A;  . TONSILECTOMY/ADENOIDECTOMY WITH MYRINGOTOMY          Home Medications    Prior to Admission medications   Medication Sig Start Date End Date Taking? Authorizing Provider  Calcium Carbonate-Vitamin D (CALCIUM-D PO) Take 1 tablet by mouth daily.   Yes [provider]  furosemide (LASIX) 20 MG tablet Take 40 mg by mouth daily.    Yes [provider]  Insulin Glargine (LANTUS SOLOSTAR) 100 UNIT/ML Solostar Pen Inject 46 Units into the skin daily before breakfast.  Yes [provider]  insulin lispro (HUMALOG KWIKPEN) 100 UNIT/ML KiwkPen Inject 8 Units into the skin 2 (two) times daily with breakfast and lunch.   Yes [provider]  metoprolol succinate (TOPROL-XL) 25 MG 24 hr tablet Take 25 mg by mouth daily. 08/11/17  Yes [provider]  sitaGLIPtin (JANUVIA) 50 MG tablet Take 50 mg by mouth daily.   Yes [provider]  Alcohol Swabs (ALCOHOL PREP) 70 %  PADS  10/26/13   [provider]    Family History Family History  Problem Relation Age of Onset  . Prostate cancer Father     Social History Social History   Tobacco Use  . Smoking status: Never Smoker  . Smokeless tobacco: Never Used  Substance Use Topics  . Alcohol use: No  . Drug use: No     Allergies   Shrimp [shellfish allergy] and Augmentin [amoxicillin-pot clavulanate]   Review of Systems Review of Systems  Constitutional: Positive for diaphoresis. Negative for fever.  Eyes: Positive for visual disturbance.  Respiratory: Negative for cough and shortness of breath.   Cardiovascular: Negative for chest pain.  Gastrointestinal: Negative for abdominal pain, nausea and vomiting.  Genitourinary: Negative for dysuria and hematuria.  Neurological: Positive for light-headedness. Negative for headaches.  Psychiatric/Behavioral: Positive for confusion.  All other systems reviewed and are negative.    Physical Exam Updated Vital Signs BP (!) 145/76   Pulse (!) 44   Resp 16   SpO2 96%   Physical Exam  Constitutional: He appears well-developed and well-nourished.  HENT:  Head: Normocephalic and atraumatic.  Mouth/Throat: Oropharynx is clear and moist and mucous membranes are normal.  Eyes: Pupils are equal, round, and reactive to light. Conjunctivae, EOM and lids are normal.  Neck: Full passive range of motion without pain.  Cardiovascular: Regular rhythm, normal heart sounds and normal pulses. Bradycardia present. Exam reveals no gallop and no friction rub.  No murmur heard. Pulses:      Radial pulses are 2+ on the right side, and 2+ on the left side.       Dorsalis pedis pulses are 2+ on the right side, and 2+ on the left side.  Pulmonary/Chest: Effort normal and breath sounds normal.  Abdominal: Soft. Normal appearance. There is no tenderness. There is no rigidity and no guarding.  Musculoskeletal: Normal range of motion.  Neurological: He is alert.    Cranial nerves III-XII intact Follows commands, Moves all extremities  5/5 strength to BUE and BLE  Sensation intact throughout all major nerve distributions Normal finger to nose. No dysdiadochokinesia. No pronator drift. No gait abnormalities  No slurred speech. No facial droop.  Alert and oriented x 2 -can tell me who is in where he is at but cannot tell me the year or the president (family states that this is baseline)   Skin: Skin is warm and dry. Capillary refill takes less than 2 seconds.  Psychiatric: He has a normal mood and affect. His speech is normal.  Nursing note and vitals reviewed.    ED Treatments / Results  Labs (all labs ordered are listed, but only abnormal results are displayed) Labs Reviewed  BASIC METABOLIC PANEL - Abnormal; Notable for the following components:      Result Value   Glucose, Bld 34 (*)    BUN 32 (*)    Creatinine, Ser 2.06 (*)    GFR calc non Af Amer 28 (*)    GFR calc Af Amer 33 (*)  All other components within normal limits  CBC WITH DIFFERENTIAL/PLATELET - Abnormal; Notable for the following components:   WBC 10.8 (*)    Platelets 141 (*)    Neutro Abs 9.0 (*)    All other components within normal limits  URINALYSIS, ROUTINE W REFLEX MICROSCOPIC - Abnormal; Notable for the following components:   Color, Urine STRAW (*)    Glucose, UA 50 (*)    Hgb urine dipstick SMALL (*)    All other components within normal limits  CBG MONITORING, ED - Abnormal; Notable for the following components:   Glucose-Capillary 32 (*)    All other components within normal limits  CBG MONITORING, ED - Abnormal; Notable for the following components:   Glucose-Capillary 116 (*)    All other components within normal limits  CBG MONITORING, ED - Abnormal; Notable for the following components:   Glucose-Capillary 107 (*)    All other components within normal limits  CBG MONITORING, ED - Abnormal; Notable for the following components:   Glucose-Capillary  100 (*)    All other components within normal limits  I-STAT TROPONIN, ED    EKG None  Radiology Dg Chest 2 View  Result Date: 08/22/2017 CLINICAL DATA:  Lethargy. EXAM: CHEST - 2 VIEW COMPARISON:  10/30/2012 FINDINGS: AP and lateral view show low volumes. The lungs are clear without focal pneumonia, edema, pneumothorax or pleural effusion. The cardiopericardial silhouette is within normal limits for size. The visualized bony structures of the thorax are intact. Telemetry leads overlie the chest. IMPRESSION: No active cardiopulmonary disease. Electronically Signed   By: Misty Stanley M.D.   On: 08/22/2017 20:41    Procedures Procedures (including critical care time)  Medications Ordered in ED Medications  dextrose 50 % solution (has no administration in time range)  dextrose 50 % solution (has no administration in time range)     Initial Impression / Assessment and Plan / ED Course  I have reviewed the triage vital signs and the nursing notes.  Pertinent labs & imaging results that were available during my care of the patient were reviewed by me and considered in my medical decision making (see chart for details).     84 year old male with past medical history of diabetes, hypertension who presents for evaluation of lethargy.  Family reports that they brought patient in because just prior to ED arrival, patient was lethargic and hard to arouse.  He states he started getting diaphoretic and was confused.  They brought him to the ED for evaluation.  On initial ED evaluation, patient had a CBG of 30.  IV started patient was given an amp of D50.  They report improvement after symptoms.  He states prior to onset of symptoms, patient had been in his normal state of health.  He had not been complaining of any chest pain, difficulty breathing.  Patient denies any pain at this time. Patient is afebrile, non-toxic appearing, sitting comfortably on examination table. Vital signs reviewed and  stable.  On exam, he is alert and oriented x2 which his family states is baseline.  No neuro deficits noted on exam.  This is likely due to hypoglycemia but will evaluate other labs for evaluation.  Repeat CBG after initial amp of D50 is 32.  Additional amp of D50 ordered.  UA shows small amount of hemoglobin, no infectious etiology.  Troponin negative.  BMP shows glucose of 34.  BUN and creatinine are elevated at 32 and 2.06.  This appears to be patient's baseline.  CBC shows leukocytosis of 10.8.  Platelets are 141.  Patient does have a history of thrombocytopenia.  Chest x-ray negative for any acute infectious etiology.  Repeat CBG after second D50 is 116.  Will have patient eat and reevaluate.  CBG 107.  We will plan to monitor and reevaluate.  Reevaluation.  Patient's vitals show he is bradycardic.  View of records show that he has been previously bradycardic before.  He is additionally on a beta-blocker.  P CBG is 100. Discussed patient with Dr. Roderic Palau after independent evaluation.  We will plan to cut patient's insulin dose from 46 units to 36 units.  Additionally given bradycardia, suggest decreasing his metoprolol.  Updated patient and family on plan.  Family became very concerned and stated that they were all comfortable with this plan.  They request consult for admission as they were worried that patient's blood sugar will slowly be dropping throughout the night and there is nobody to check on him.  I explained to him that his blood sugars remained stable while being here and that given that he had long-acting insulin, will most likely continue to drop but is otherwise stable.  They again expressed concerns and requested to consult for admission.  Explained to them that we can consult hospice but cannot guarantee admission. Will consult hospitalist.  Discussed with hospitalist. Will admit.   Final Clinical Impressions(s) / ED Diagnoses   Final diagnoses:  Hypoglycemia  Bradycardia     ED Discharge Orders    None       Volanda Napoleon, PA-C 08/23/17 0041    Milton Ferguson, MD 08/24/17 1030

## 2017-08-23 ENCOUNTER — Other Ambulatory Visit: Payer: Self-pay

## 2017-08-23 ENCOUNTER — Encounter (HOSPITAL_COMMUNITY): Payer: Self-pay | Admitting: *Deleted

## 2017-08-23 DIAGNOSIS — E162 Hypoglycemia, unspecified: Secondary | ICD-10-CM | POA: Diagnosis present

## 2017-08-23 DIAGNOSIS — R001 Bradycardia, unspecified: Secondary | ICD-10-CM

## 2017-08-23 LAB — CBC
HCT: 44.1 % (ref 39.0–52.0)
Hemoglobin: 14.9 g/dL (ref 13.0–17.0)
MCH: 30.5 pg (ref 26.0–34.0)
MCHC: 33.8 g/dL (ref 30.0–36.0)
MCV: 90.4 fL (ref 78.0–100.0)
Platelets: 143 10*3/uL — ABNORMAL LOW (ref 150–400)
RBC: 4.88 MIL/uL (ref 4.22–5.81)
RDW: 12.3 % (ref 11.5–15.5)
WBC: 8.6 10*3/uL (ref 4.0–10.5)

## 2017-08-23 LAB — HEMOGLOBIN A1C
HEMOGLOBIN A1C: 8.1 % — AB (ref 4.8–5.6)
Mean Plasma Glucose: 185.77 mg/dL

## 2017-08-23 LAB — BASIC METABOLIC PANEL
Anion gap: 7 (ref 5–15)
BUN: 28 mg/dL — ABNORMAL HIGH (ref 8–23)
CHLORIDE: 104 mmol/L (ref 98–111)
CO2: 24 mmol/L (ref 22–32)
CREATININE: 1.75 mg/dL — AB (ref 0.61–1.24)
Calcium: 8.3 mg/dL — ABNORMAL LOW (ref 8.9–10.3)
GFR calc Af Amer: 40 mL/min — ABNORMAL LOW (ref >60)
GFR calc non Af Amer: 35 mL/min — ABNORMAL LOW (ref >60)
Glucose, Bld: 118 mg/dL — ABNORMAL HIGH (ref 70–99)
POTASSIUM: 3.7 mmol/L (ref 3.5–5.1)
SODIUM: 135 mmol/L (ref 135–145)

## 2017-08-23 LAB — GLUCOSE, CAPILLARY
GLUCOSE-CAPILLARY: 82 mg/dL (ref 70–99)
Glucose-Capillary: 100 mg/dL — ABNORMAL HIGH (ref 70–99)
Glucose-Capillary: 204 mg/dL — ABNORMAL HIGH (ref 70–99)
Glucose-Capillary: 110 mg/dL — ABNORMAL HIGH (ref 70–99)
Glucose-Capillary: 267 mg/dL — ABNORMAL HIGH (ref 70–99)

## 2017-08-23 MED ORDER — INSULIN ASPART 100 UNIT/ML ~~LOC~~ SOLN
0.0000 [IU] | Freq: Three times a day (TID) | SUBCUTANEOUS | Status: DC
  Administered 2017-08-23: 3 [IU] via SUBCUTANEOUS
  Administered 2017-08-24: 1 [IU] via SUBCUTANEOUS
  Administered 2017-08-24: 3 [IU] via SUBCUTANEOUS
  Administered 2017-08-25: 1 [IU] via SUBCUTANEOUS
  Administered 2017-08-25: 2 [IU] via SUBCUTANEOUS

## 2017-08-23 MED ORDER — HEPARIN SODIUM (PORCINE) 5000 UNIT/ML IJ SOLN
5000.0000 [IU] | Freq: Three times a day (TID) | INTRAMUSCULAR | Status: DC
  Administered 2017-08-23 – 2017-08-25 (×7): 5000 [IU] via SUBCUTANEOUS
  Filled 2017-08-23 (×7): qty 1

## 2017-08-23 MED ORDER — SODIUM CHLORIDE 0.9% FLUSH
3.0000 mL | Freq: Two times a day (BID) | INTRAVENOUS | Status: DC
  Administered 2017-08-23 – 2017-08-25 (×6): 3 mL via INTRAVENOUS

## 2017-08-23 MED ORDER — METOPROLOL SUCCINATE ER 25 MG PO TB24
25.0000 mg | ORAL_TABLET | Freq: Every day | ORAL | Status: DC
  Administered 2017-08-23 – 2017-08-25 (×3): 25 mg via ORAL
  Filled 2017-08-23 (×3): qty 1

## 2017-08-23 NOTE — Evaluation (Signed)
Occupational Therapy Evaluation Patient Details Name: Tony Vasquez MRN: [id number] DOB: 27-Nov-1932 Today's Date: 08/23/2017    History of Present Illness Admitted with confusion, diaphortic, decreased balance; found to have episode of low blood sugar; apparently has run low for 3-5 days leading up to going to ED;  has a past medical history of At risk for sleep apnea, Bilateral renal cysts, Dyslipidemia, Hydronephrosis of left kidney, Hypertension, Mild renal insufficiency, OA (osteoarthritis), Prostate cancer ([place]), Radiation (01/16/14-03/16/14), and Type 2 diabetes mellitus ([place]).   Clinical Impression   This 84 yo male admitted with above presents to acute OT with mild decrease in balance thus requiring min guard A when up on his feet in any dynamic capacity, thus affecting his safety and independence with basic ADLs. He will benefit from acute OT without need for follow up, but do recommend 24 hour S/prn A initially until family feel comfortable leaving him alone.    Follow Up Recommendations  Supervision/Assistance - 24 hour;No OT follow up   Equipment Recommendations  None recommended by OT       Precautions / Restrictions Precautions Precautions: Fall Restrictions Weight Bearing Restrictions: No      Mobility Bed Mobility Overal bed mobility: Independent                Transfers Overall transfer level: Needs assistance Equipment used: None Transfers: Sit to/from Stand Sit to Stand: Min guard            Balance Overall balance assessment: Needs assistance Sitting-balance support: No upper extremity supported;Feet supported Sitting balance-Leahy Scale: Good     Standing balance support: No upper extremity supported Standing balance-Leahy Scale: Fair Standing balance comment: did need min guard A for dynamic balance                           ADL either performed or assessed with clinical judgement   ADL Overall ADL's : Needs  assistance/impaired Eating/Feeding: Independent;Sitting   Grooming: Min guard;Standing;Wash/dry face;Wash/dry hands   Upper Body Bathing: Set up;Sitting   Lower Body Bathing: Min guard;Sit to/from stand   Upper Body Dressing : Set up;Sitting   Lower Body Dressing: Min guard;Sit to/from stand   Toilet Transfer: Min guard;Ambulation;Regular Museum/gallery exhibitions officer and Hygiene: Min guard;Sit to/from stand               Vision Patient Visual Report: No change from baseline              Pertinent Vitals/Pain Pain Assessment: No/denies pain     Hand Dominance Right   Extremity/Trunk Assessment Upper Extremity Assessment Upper Extremity Assessment: Overall WFL for tasks assessed     Communication Communication Communication: No difficulties   Cognition Arousal/Alertness: Awake/alert Behavior During Therapy: WFL for tasks assessed/performed Overall Cognitive Status: History of cognitive impairments - at baseline                                 General Comments: Knew he was at [facility], but not date   General Comments             Home Living Family/patient expects to be discharged to:: Private residence Living Arrangements: Children Available Help at Discharge: Family(available most of time per pt) Type of Home: House Home Access: Stairs to enter [employer] of Steps: 1   Home Layout: One level  Bathroom Shower/Tub: Corporate investment banker: Standard     Home Equipment: Cane - single point          Prior Functioning/Environment Level of Independence: Independent with assistive device(s);Needs assistance  Gait / Transfers Assistance Needed: Recently started using cane fo ramb ADL's / Homemaking Assistance Needed: per chart review, assist for IADLs   Comments: uses SPC        OT Problem List: Impaired balance (sitting and/or standing)         OT Goals(Current goals  can be found in the care plan section) Acute Rehab OT Goals Patient Stated Goal: to go home OT Goal Formulation: With patient Time For Goal Achievement: 09/06/17 Potential to Achieve Goals: Good  OT Frequency:                AM-PAC PT "6 Clicks" Daily Activity     Outcome Measure Help from another person eating meals?: None Help from another person taking care of personal grooming?: A Little Help from another person toileting, which includes using toliet, bedpan, or urinal?: A Little Help from another person bathing (including washing, rinsing, drying)?: A Little Help from another person to put on and taking off regular upper body clothing?: A Little Help from another person to put on and taking off regular lower body clothing?: A Little 6 Click Score: 19   End of Session Equipment Utilized During Treatment: Gait belt Nurse Communication: Mobility status(with NT)  Activity Tolerance: Patient tolerated treatment well Patient left: in chair;with call bell/phone within reach;with chair alarm set  OT Visit Diagnosis: Unsteadiness on feet (R26.81)                Time: 0211-1552 OT Time Calculation (min): 18 min Charges:  OT General Charges $OT Visit: 1 Visit OT Evaluation $OT Eval Moderate Complexity: [address] [place], [place] [phone] 08/23/2017

## 2017-08-23 NOTE — H&P (Signed)
History and Physical   Tony Vasquez JYN:[id number] DOB: 1932/02/23 DOA: 08/22/2017  PCP: Haywood Pao, MD  Chief Complaint: confusion  HPI:  This is an 84 year old man with medical problems including insulin-dependent diabetes, localized prostate cancer, chronic kidney disease with baseline creatinine of 2, dementia, hypertension presents with confusion. Of note, history is obtained from talking with family present at bedside which include his daughter as well as his son.  Patient's daughter went to pick up the patient this evening, the patient went in her vehicle to her home, however upon getting out of the car he became confused and was stumbling. He was not talking and was drooling, notably diaphoretic. He subsequently taken to the emergency room within one hour. In emergency room recently not be hypoglycemic with blood sugar of 32. He is given 2 ampules of D50 with improvement in his blood sugar to 100.  Family reports at baseline the patient has some dementia, is independent in his ADLs, walks the single-prong cane, needs assistance with his IADLs. He lives with his son who assists him with his medical management. They report that over the past 3-5 days he's had lower blood sugars ranging from 30-60 in the morning before breakfast. He reports taking basal insulin 46 units nightly in addition to lispro rapid acting insulin 8 units before breakfast and lunch in addition to his Januvia dose of 50 mg daily.  Patient denies any other acute symptoms including chest pain, fevers, chills, shortness of breath, cough, dysuria.  ED Course: vital signs are normal for heart rate of 48, systolic blood pressure 865 mm Hg, otherwise vital signs unremarkable. CBC reveals white count of 10.8 with an ANC of 9000, normal hemoglobin and platelet count.  BMP with glucose of 34, creatinine 2. Troponin normal. Chest x-ray without acute pathology.  EKG with sinus bradycardia.  Review of Systems: A complete  ROS was obtained; pertinent positives negatives are denoted in the HPI. Otherwise, all systems are negative.   Past Medical History:  Diagnosis Date  . At risk for sleep apnea    STOP-BANG= 4    SENT TO PCP 11-16-2013  . Bilateral renal cysts   . Dyslipidemia   . Hydronephrosis of left kidney   . Hypertension   . Mild renal insufficiency   . OA (osteoarthritis)   . Prostate cancer ([place])   . Radiation 01/16/14-03/16/14   Prostate 78 gray in 40 fractions  . Type 2 diabetes mellitus ([place])    Social History   Socioeconomic History  . Marital status: Widowed    Spouse name: Not on file  . Number of children: 4br  . Years of education: Not on file  . Highest education level: Not on file  Occupational History  . Occupation: Horticulturist, commercial  Social Needs  . Financial resource strain: Not on file  . Food insecurity:    Worry: Not on file    Inability: Not on file  . Transportation needs:    Medical: Not on file    Non-medical: Not on file  Tobacco Use  . Smoking status: Never Smoker  . Smokeless tobacco: Never Used  Substance and Sexual Activity  . Alcohol use: No  . Drug use: No  . Sexual activity: Not on file  Lifestyle  . Physical activity:    Days per week: Not on file    Minutes per session: Not on file  . Stress: Not on file  Relationships  . Social connections:    Talks on  phone: Not on file    Gets together: Not on file    Attends religious service: Not on file    Active member of club or organization: Not on file    Attends meetings of clubs or organizations: Not on file    Relationship status: Not on file  . Intimate partner violence:    Fear of current or ex partner: Not on file    Emotionally abused: Not on file    Physically abused: Not on file    Forced sexual activity: Not on file  Other Topics Concern  . Not on file  Social History Narrative  . Not on file   Family History  Problem Relation Age of Onset  . Prostate cancer Father     Physical  Exam: Vitals:   08/22/17 2015 08/22/17 2045  BP: 130/65 (!) 145/76  Pulse: (!) 46 (!) 44  Resp: 18 16  SpO2: 98% 96%   General: Appears calm and comfortable. Elderly black man. ENT: Grossly normal hearing, MMM. Cardiovascular: RRR. No M/R/G. No LE edema.  Heart sounds distant. Respiratory: CTA bilaterally. No wheezes or crackles. Normal respiratory effort. Breathing room air. Abdomen: Soft, non-tender. No rebound or guarding. Skin: No rash or induration seen on limited exam. Musculoskeletal: Grossly normal tone BUE/BLE. Appropriate ROM.  Psychiatric: Grossly normal mood and affect. Neurologic: Moves all extremities in coordinated fashion. Not oriented to year or location.  He is able to recognize family members in room and able to provide long term recollection of events (worked as a Horticulturist, commercial).  I have personally reviewed the following labs, culture data, and imaging studies.  Assessment/Plan:  #Symptomatic hypoglycemia #Insulin dependent DM This elderly man presents with confusion found to be hypoglycemic with BS < 40; his confusion has resolved close to his baseline with provision of D50.  His family is concerned about safety at home and their ability to adequately optimize his BS and anti-DM therapy successfully.  Given his medical complexity (dementia, CKD, insulin dependence) I believe it is reasonable to optimize his BS and medical regimen to ensure safe discharge plan prior to ongoing PCP optimization.  Family reports BS consistently < 60 mg/dL at home based on fasting AM blood sugar readings. Plan: Will hold basal insulin at this time.  Rapid acting correction factor insulin AC ordered.  Liberalization of diet.  Hold Januvia. Plan to assess BS trend to optimize his BS control. Anticipate dose reduction of his home regimen.  #Other problems: -HTN: will hold BB and loop diuretic for now, consider resuming in AM -Leukocytosis: neutrophil predominance, no infection evident,  suspect stress reaction, trend -CKD, stage IIIB, likely: trend Cr, appears at baseline -Dementia: frequent orientation  DVT prophylaxis: Subq hep given CKD Code Status: full code, per patient and family present at bedside Disposition Plan: Anticipate D/C home as early as within 24 hours Consults called: none Admission status:  Admit to hospital medicine service  Cheri Rous, MD [facility] Page:[phone]  If 7PM-7AM, please contact night-coverage [email or web address] Password [email or web address]

## 2017-08-23 NOTE — Progress Notes (Signed)
PROGRESS NOTE    Tony Vasquez  KGM:[id number] DOB: Oct 04, 1932 DOA: 08/22/2017 PCP: Haywood Pao, MD  Brief Narrative:84 year old man with medical problems including insulin-dependent diabetes, localized prostate cancer, chronic kidney disease with baseline creatinine of 2, dementia, hypertension presents with confusion. Of note, history is obtained from talking with family present at bedside which include his daughter as well as his son.  Patient's daughter went to pick up the patient this evening, the patient went in her vehicle to her home, however upon getting out of the car he became confused and was stumbling. He was not talking and was drooling, notably diaphoretic. He subsequently taken to the emergency room within one hour. In emergency room recently not be hypoglycemic with blood sugar of 32. He is given 2 ampules of D50 with improvement in his blood sugar to 100.  Family reports at baseline the patient has some dementia, is independent in his ADLs, walks the single-prong cane, needs assistance with his IADLs. He lives with his son who assists him with his medical management. They report that over the past 3-5 days he's had lower blood sugars ranging from 30-60 in the morning before breakfast. He reports taking basal insulin 46 units nightly in addition to lispro rapid acting insulin 8 units before breakfast and lunch in addition to his Januvia dose of 50 mg daily.  Patient denies any other acute symptoms including chest pain, fevers, chills, shortness of breath, cough, dysuria.  ED Course: vital signs are normal for heart rate of 48, systolic blood pressure 644 mm Hg, otherwise vital signs unremarkable. CBC reveals white count of 10.8 with an ANC of 9000, normal hemoglobin and platelet count.  BMP with glucose of 34, creatinine 2. Troponin normal. Chest x-ray without acute pathology.  EKG with sinus bradycardia.  Review of Systems: A complete ROS was obtained; pertinent  positives negatives are denoted in the HPI. Otherwise, all systems are negative.      Assessment & Plan:   Active Problems:   Hypoglycemia  1] hypoglycemia-symptomatic patient admitted with confusion.  At this time his blood sugar is 100.  He is sitting up in his chair.  Family by the bedside.  The family is still not sure how to get and give his insulin at home.  Januvia and long-acting insulin is still on hold at this time.  I have put in a consult to diabetic educator.  When I was in the room.  He had 3 children with him on man in 2 ladies none of them lives with patient at home.  Medical conditions he lives at home with 1 of his sons who manages his medications.  I will watch him on overnight continue to hold his home insulin and Januvia and plan discharge tomorrow.  Patient and his family feels they are not ready to go home because they do not know what to do and how much insulin to give him.  Hemoglobin A1c is 8.1.  The patient and recommends outpatient physical therapy.  2] hypertension- Home medications have been restarted.  3] history of dementia stable at this time.  4] CKD stage III stable and improving.    DVT prophylaxis: Heparin Code Status: Full code Family Communication: Discussed with the 3 children that was in the room Disposition Plan: Plan discharge in the morning Consultants: None  Procedures: None Antimicrobials: None  Subjective: Sitting up in chair awake alert.  Objective: Vitals:   08/23/17 0045 08/23/17 0137 08/23/17 0513 08/23/17 0939  BP: Marland Kitchen)  151/68 (!) 154/62 (!) 149/74 135/63  Pulse: (!) 53 (!) 50 (!) 51 60  Resp: 16 20 16 18   Temp:  98.3 F (36.8 C) 98.3 F (36.8 C) 98.5 F (36.9 C)  TempSrc:  Oral Oral Oral  SpO2: 99% 100% 100% 100%  Weight:  70.3 kg (154 lb 15.7 oz)    Height:  5\' 5"  (1.651 m)      Intake/Output Summary (Last 24 hours) at 08/23/2017 1123 Last data filed at 08/23/2017 1048 Gross per 24 hour  Intake 240 ml  Output 500  ml  Net -260 ml   Filed Weights   08/23/17 0137  Weight: 70.3 kg (154 lb 15.7 oz)    Examination:  General exam: Appears calm and comfortable  Respiratory system: Clear to auscultation. Respiratory effort normal. Cardiovascular system: S1 & S2 heard, RRR. No JVD, murmurs, rubs, gallops or clicks. No pedal edema. Gastrointestinal system: Abdomen is nondistended, soft and nontender. No organomegaly or masses felt. Normal bowel sounds heard. Central nervous system: Alert and oriented. No focal neurological deficits. Extremities: Symmetric 5 x 5 power.NO edema Skin: No rashes, lesions or ulcers Psychiatry: Judgement and insight appear normal. Mood & affect appropriate.     Data Reviewed: I have personally reviewed following labs and imaging studies  CBC: Recent Labs  Lab 08/22/17 2052 08/23/17 0336  WBC 10.8* 8.6  NEUTROABS 9.0*  --   HGB 15.6 14.9  HCT 48.5 44.1  MCV 93.8 90.4  PLT 141* 161*   Basic Metabolic Panel: Recent Labs  Lab 08/22/17 2052 08/23/17 0336  NA 135 135  K 4.0 3.7  CL 102 104  CO2 22 24  GLUCOSE 34* 118*  BUN 32* 28*  CREATININE 2.06* 1.75*  CALCIUM 8.9 8.3*   GFR: Estimated Creatinine Clearance: 28.3 mL/min (A) (by C-G formula based on SCr of 1.75 mg/dL (H)). Liver Function Tests: No results for input(s): AST, ALT, ALKPHOS, BILITOT, PROT, ALBUMIN in the last 168 hours. No results for input(s): LIPASE, AMYLASE in the last 168 hours. No results for input(s): AMMONIA in the last 168 hours. Coagulation Profile: No results for input(s): INR, PROTIME in the last 168 hours. Cardiac Enzymes: No results for input(s): CKTOTAL, CKMB, CKMBINDEX, TROPONINI in the last 168 hours. BNP (last 3 results) No results for input(s): PROBNP in the last 8760 hours. HbA1C: Recent Labs    08/23/17 0336  HGBA1C 8.1*   CBG: Recent Labs  Lab 08/22/17 2140 08/22/17 2214 08/22/17 2338 08/23/17 0139 08/23/17 0812  GLUCAP 116* 107* 100* 100* 82   Lipid  Profile: No results for input(s): CHOL, HDL, LDLCALC, TRIG, CHOLHDL, LDLDIRECT in the last 72 hours. Thyroid Function Tests: No results for input(s): TSH, T4TOTAL, FREET4, T3FREE, THYROIDAB in the last 72 hours. Anemia Panel: No results for input(s): VITAMINB12, FOLATE, FERRITIN, TIBC, IRON, RETICCTPCT in the last 72 hours. Sepsis Labs: No results for input(s): PROCALCITON, LATICACIDVEN in the last 168 hours.  No results found for this or any previous visit (from the past 240 hour(s)).       Radiology Studies: Dg Chest 2 View  Result Date: 08/22/2017 CLINICAL DATA:  Lethargy. EXAM: CHEST - 2 VIEW COMPARISON:  10/30/2012 FINDINGS: AP and lateral view show low volumes. The lungs are clear without focal pneumonia, edema, pneumothorax or pleural effusion. The cardiopericardial silhouette is within normal limits for size. The visualized bony structures of the thorax are intact. Telemetry leads overlie the chest. IMPRESSION: No active cardiopulmonary disease. Electronically Signed   By: Randall Hiss  Tery Sanfilippo M.D.   On: 08/22/2017 20:41        Scheduled Meds: . heparin  5,000 Units Subcutaneous Q8H  . insulin aspart  0-9 Units Subcutaneous TID WC  . metoprolol succinate  25 mg Oral Daily  . sodium chloride flush  3 mL Intravenous Q12H   Continuous Infusions:   LOS: 0 days      Georgette Shell, MD [facility] Pager [phone]  If 7PM-7AM, please contact night-coverage [email or web address] Password [facility] 08/23/2017, 11:23 AM

## 2017-08-23 NOTE — Evaluation (Signed)
Physical Therapy Evaluation Patient Details Name: Tony Vasquez MRN: [id number] DOB: 1932/04/09 Today's Date: 08/23/2017   History of Present Illness  Admitted with episode of low blood sugar; apparently has run low for 3-5 days leading up to going to ED;  has a past medical history of At risk for sleep apnea, Bilateral renal cysts, Dyslipidemia, Hydronephrosis of left kidney, Hypertension, Mild renal insufficiency, OA (osteoarthritis), Prostate cancer ([place]), Radiation (01/16/14-03/16/14), and Type 2 diabetes mellitus ([place]).  Clinical Impression   Pt admitted with above diagnosis. Pt currently with functional limitations due to the deficits listed below (see PT Problem List). Presents with gait and balance dysfunction and fall risk;  Pt will benefit from skilled PT to increase their independence and safety with mobility to allow discharge to the venue listed below.       Follow Up Recommendations Outpatient PT    Equipment Recommendations  None recommended by PT    Recommendations for Other Services       Precautions / Restrictions Precautions Precautions: Fall      Mobility  Bed Mobility                  Transfers Overall transfer level: Needs assistance Equipment used: None Transfers: Sit to/from Stand Sit to Stand: Min guard         General transfer comment: Noted dependent on UEs to push off and stabilize  Ambulation/Gait Ambulation/Gait assistance: Min guard(without physical contact) Gait Distance (Feet): 200 Feet Assistive device: Straight cane;Rolling walker (2 wheeled) Gait Pattern/deviations: Decreased step length - right;Decreased step length - left;Decreased stride length;Trunk flexed(stiff trunk)     General Gait Details: Very slow cadence, especially when engaged in converstion; walk with cane and then with RW with no gross difference in gait speed; cues for upper trunk extension  Stairs            Wheelchair Mobility    Modified Rankin  (Stroke Patients Only)       Balance Overall balance assessment: Needs assistance           Standing balance-Leahy Scale: Fair(Will consider Berg or DGI next session)                               Pertinent Vitals/Pain Pain Assessment: No/denies pain    Home Living Family/patient expects to be discharged to:: Private residence Living Arrangements: Children Available Help at Discharge: Family(available most of time per pt) Type of Home: House Home Access: Stairs to enter   [employer] of Steps: 1 Home Layout: One level Home Equipment: Cane - single point      Prior Function Level of Independence: Independent with assistive device(s);Needs assistance   Gait / Transfers Assistance Needed: Recently started using cane fo ramb  ADL's / Homemaking Assistance Needed: per chart review, assist for IADLs  Comments: uses SPC     Hand Dominance   Dominant Hand: Right    Extremity/Trunk Assessment   Upper Extremity Assessment Upper Extremity Assessment: Defer to OT evaluation    Lower Extremity Assessment Lower Extremity Assessment: Generalized weakness    Cervical / Trunk Assessment Cervical / Trunk Assessment: Other exceptions Cervical / Trunk Exceptions: Noted trunk stiffness with amb; mild kyphosis  Communication   Communication: No difficulties  Cognition Arousal/Alertness: Awake/alert Behavior During Therapy: WFL for tasks assessed/performed Overall Cognitive Status: History of cognitive impairments - at baseline  General Comments General comments (skin integrity, edema, etc.): Discussed HHPT versus Outpt PT with family, and they indicated they can provide transportation to Outpt PT appointments    Exercises     Assessment/Plan    PT Assessment Patient needs continued PT services  PT Problem List Decreased strength;Decreased range of motion;Decreased activity  tolerance;Decreased balance;Decreased mobility;Decreased coordination;Decreased cognition;Decreased knowledge of use of DME;Decreased safety awareness;Decreased knowledge of precautions       PT Treatment Interventions DME instruction;Gait training;Stair training;Functional mobility training;Therapeutic activities;Therapeutic exercise;Balance training;Neuromuscular re-education;Cognitive remediation    PT Goals (Current goals can be found in the Care Plan section)  Acute Rehab PT Goals Patient Stated Goal: was agreeable to amb PT Goal Formulation: With patient Time For Goal Achievement: 09/06/17 Potential to Achieve Goals: Good Additional Goals Additional Goal #1: Pt will score greater than 19/24 on Dynamic Gait Index to demonstrate decr fall risk    Frequency Min 3X/week   Barriers to discharge        Co-evaluation               AM-PAC PT "6 Clicks" Daily Activity  Outcome Measure Difficulty turning over in bed (including adjusting bedclothes, sheets and blankets)?: None Difficulty moving from lying on back to sitting on the side of the bed? : A Little Difficulty sitting down on and standing up from a chair with arms (e.g., wheelchair, bedside commode, etc,.)?: A Little Help needed moving to and from a bed to chair (including a wheelchair)?: None Help needed walking in hospital room?: A Little Help needed climbing 3-5 steps with a railing? : A Lot 6 Click Score: 19    End of Session Equipment Utilized During Treatment: Gait belt Activity Tolerance: Patient tolerated treatment well Patient left: in chair;with call bell/phone within reach;with chair alarm set;with family/visitor present Nurse Communication: Mobility status PT Visit Diagnosis: Unsteadiness on feet (R26.81);Other abnormalities of gait and mobility (R26.89)    Time: 3500-9381 PT Time Calculation (min) (ACUTE ONLY): 24 min   Charges:   PT Evaluation $PT Eval Low Complexity: 1 Low PT Treatments $Gait  Training: 8-22 mins   PT G Codes:        Roney Marion, PT  Acute Rehabilitation Services Pager [phone] Office [phone]   [place] 08/23/2017, 9:29 AM

## 2017-08-24 DIAGNOSIS — R001 Bradycardia, unspecified: Secondary | ICD-10-CM

## 2017-08-24 DIAGNOSIS — E162 Hypoglycemia, unspecified: Secondary | ICD-10-CM | POA: Diagnosis not present

## 2017-08-24 DIAGNOSIS — R066 Hiccough: Secondary | ICD-10-CM | POA: Diagnosis not present

## 2017-08-24 DIAGNOSIS — E0800 Diabetes mellitus due to underlying condition with hyperosmolarity without nonketotic hyperglycemic-hyperosmolar coma (NKHHC): Secondary | ICD-10-CM | POA: Diagnosis not present

## 2017-08-24 LAB — GLUCOSE, CAPILLARY
Glucose-Capillary: 112 mg/dL — ABNORMAL HIGH (ref 70–99)
Glucose-Capillary: 239 mg/dL — ABNORMAL HIGH (ref 70–99)
Glucose-Capillary: 147 mg/dL — ABNORMAL HIGH (ref 70–99)
Glucose-Capillary: 246 mg/dL — ABNORMAL HIGH (ref 70–99)

## 2017-08-24 MED ORDER — INSULIN GLARGINE 100 UNIT/ML ~~LOC~~ SOLN
15.0000 [IU] | Freq: Every day | SUBCUTANEOUS | Status: DC
  Administered 2017-08-24 – 2017-08-25 (×2): 15 [IU] via SUBCUTANEOUS
  Filled 2017-08-24 (×2): qty 0.15

## 2017-08-24 NOTE — Progress Notes (Signed)
Physical Therapy Treatment Patient Details Name: Tony Vasquez MRN: [id number] DOB: [birth year] Today's Date: 08/24/2017    History of Present Illness Admitted with confusion, diaphortic, decreased balance; found to have episode of low blood sugar; apparently has run low for 3-5 days leading up to going to ED;  has a past medical history of At risk for sleep apnea, Bilateral renal cysts, Dyslipidemia, Hydronephrosis of left kidney, Hypertension, Mild renal insufficiency, OA (osteoarthritis), Prostate cancer ([place]), Radiation (01/16/14-03/16/14), and Type 2 diabetes mellitus ([place]).    PT Comments    Continuing work on functional mobility and activity tolerance;  REsponded well to cues to speed up amb; Min/minguard assist to ascend/descend stairs; consider standardized balance test (DGI or Berg) next session   Follow Up Recommendations  Outpatient PT     Equipment Recommendations  None recommended by PT    Recommendations for Other Services       Precautions / Restrictions Precautions Precautions: Fall Restrictions Weight Bearing Restrictions: No    Mobility  Bed Mobility Overal bed mobility: Independent                Transfers Overall transfer level: Needs assistance Equipment used: None Transfers: Sit to/from Stand Sit to Stand: Min guard         General transfer comment: Noted dependent on UEs to push off and stabilize, furniture walked in room  Ambulation/Gait Ambulation/Gait assistance: Min guard Gait Distance (Feet): 130 Feet Assistive device: Straight cane Gait Pattern/deviations: Decreased step length - right;Decreased step length - left;Decreased stride length;Trunk flexed(stiff trunk)     General Gait Details: Very slow cadence, especially when engaged in converstion; cues to incr gait speed   Stairs Stairs: Yes Stairs assistance: Min assist Stair Management: One rail Left;With cane;Alternating pattern;Step to pattern;Forwards Number of Stairs:  12 General stair comments: Started steps alternating with some difficulty; cued for step-by-step pattern and noted more steady   Wheelchair Mobility    Modified Rankin (Stroke Patients Only)       Balance     Sitting balance-Leahy Scale: Good       Standing balance-Leahy Scale: Fair                              Cognition Arousal/Alertness: Awake/alert Behavior During Therapy: WFL for tasks assessed/performed Overall Cognitive Status: History of cognitive impairments - at baseline                                 General Comments: Knew he was at [facility], but not date      Exercises      General Comments        Pertinent Vitals/Pain Pain Assessment: No/denies pain    Home Living                      Prior Function            PT Goals (current goals can now be found in the care plan section) Acute Rehab PT Goals Patient Stated Goal: to go home PT Goal Formulation: With patient Time For Goal Achievement: 09/06/17 Potential to Achieve Goals: Good Progress towards PT goals: Progressing toward goals    Frequency    Min 3X/week      PT Plan Current plan remains appropriate    Co-evaluation  AM-PAC PT "6 Clicks" Daily Activity  Outcome Measure  Difficulty turning over in bed (including adjusting bedclothes, sheets and blankets)?: None Difficulty moving from lying on back to sitting on the side of the bed? : None Difficulty sitting down on and standing up from a chair with arms (e.g., wheelchair, bedside commode, etc,.)?: A Little Help needed moving to and from a bed to chair (including a wheelchair)?: None Help needed walking in hospital room?: None Help needed climbing 3-5 steps with a railing? : A Little 6 Click Score: 22    End of Session Equipment Utilized During Treatment: Gait belt Activity Tolerance: Patient tolerated treatment well Patient left: in chair;with call bell/phone  within reach;with chair alarm set Nurse Communication: Mobility status PT Visit Diagnosis: Unsteadiness on feet (R26.81);Other abnormalities of gait and mobility (R26.89)     Time: 7353-2992 PT Time Calculation (min) (ACUTE ONLY): 20 min  Charges:  $Gait Training: 8-22 mins                    G Codes:       Roney Marion, PT  Acute Rehabilitation Services Pager [phone] [place] 08/24/2017, 1:04 PM

## 2017-08-24 NOTE — Progress Notes (Signed)
Nutrition Brief Note  Patient identified on the Malnutrition Screening Tool (MST) Report  Wt Readings from Last 15 Encounters:  08/24/17 158 lb 1.1 oz (71.7 kg)  10/04/14 176 lb 9.6 oz (80.1 kg)  04/12/14 163 lb 12.8 oz (74.3 kg)  03/13/14 165 lb 3.2 oz (74.9 kg)  03/06/14 167 lb 8 oz (76 kg)  02/27/14 166 lb 11.2 oz (75.6 kg)  02/20/14 168 lb 4.8 oz (76.3 kg)  02/13/14 168 lb (76.2 kg)  02/06/14 168 lb 11.2 oz (76.5 kg)  01/30/14 165 lb 9.6 oz (75.1 kg)  01/23/14 165 lb 4.8 oz (75 kg)  01/16/14 162 lb 14.4 oz (73.9 kg)  12/20/13 161 lb 9.6 oz (73.3 kg)  11/21/13 155 lb (70.3 kg)  11/02/12 157 lb 6.4 oz (71.4 kg)  No weight encounters from the past year. Pt indicates no weight loss. Indicates UBW around 155-160 pounds over the past year  Body mass index is 26.3 kg/m.    Current diet order is Regular, patient is consuming approximately 80-100% of meals at this time. Pt and pt's daughter both indicate that pt has a very good appetite at home, eats 3 meals per day and snacks between. Labs and medications reviewed.   No nutrition interventions warranted at this time. If nutrition issues arise, please consult RD.   Kerman Passey MS, RD, [place], [place] [phone] Pager  [phone] Weekend/On-Call Pager

## 2017-08-24 NOTE — Progress Notes (Addendum)
Inpatient Diabetes Program Recommendations  AACE/ADA: New Consensus Statement on Inpatient Glycemic Control (2015)  Target Ranges:  Prepandial:   less than 140 mg/dL      Peak postprandial:   less than 180 mg/dL (1-2 hours)      Critically ill patients:  140 - 180 mg/dL   Inpatient Diabetes Program Recommendations:     Spoke with patient's PCP, Dr. Osborne Casco with [facility] this am. Dr. Osborne Casco reported patient was not compliant with meds at May visit A1c 9%, and that the patient was started back on his meds. Dr. Osborne Casco stated his renal function probably got worse and that's why he started having hypoglycemia.  Dr. Osborne Casco requested:   -  Reduce basal insulin by half, Lantus 20 units. Weight based 0.2 units/kg is 14 units if this dosing is preferred.  -  Start a 0-9 units tid Correction scale.   -  D/c home Januvia.   -  And schedule a follow up within a week for labs and medication adjustment.  Thanks,  Tama Headings RN, MSN, BC-ADM, [facility] Inpatient Diabetes Coordinator Team Pager [phone] (8a-5p)

## 2017-08-24 NOTE — Progress Notes (Signed)
Inpatient Diabetes Program Recommendations  AACE/ADA: New Consensus Statement on Inpatient Glycemic Control (2015)  Target Ranges:  Prepandial:   less than 140 mg/dL      Peak postprandial:   less than 180 mg/dL (1-2 hours)      Critically ill patients:  140 - 180 mg/dL   Spoke with patient and family about home regimen and glucose control at home. Patient's son reports giving patient his medication consistently everyday. They last saw Dr. Osborne Casco in May and saw the office nutritionist, Oretha Ellis on 6/26. Spoke with family and patient about worsening renal function in the setting of insulin use. Spoke to them about following up with Dr. Osborne Casco within one week for follow up labs and insulin titration after d/c. Family seemed comfortable with management inpatient and PCP follow up. Discussed current A1c results 8.1%. Patient and family do not have any questions or concerns at this time.   Thanks,  Tama Headings RN, MSN, [facility] Inpatient Diabetes Coordinator Team Pager [phone] (8a-5p)

## 2017-08-24 NOTE — Discharge Instructions (Addendum)
Do not take Januvia Take half of Lantus Use Humalog Correction scale three times a day with meals Check glucose 4 times a day with meals and at bedtime See Dr. Osborne Casco in 1 week for follow up  Humalog Correction scale  120-150 = 1 unit 151-200 = 2 units 201-250 = 3 units 251-300 = 4 units 301-350 = 5 units 351-400 = 6 units

## 2017-08-24 NOTE — Progress Notes (Signed)
PROGRESS NOTE    Tony Vasquez  TML:[id number] DOB: Sep 05, 1932 DOA: 08/22/2017 PCP: Haywood Pao, MD  Brief Narrative:84 year old man with medical problems including insulin-dependent diabetes, localized prostate cancer, chronic kidney disease with baseline creatinine of 2, dementia, hypertension presents with confusion hypoglycemia in the setting of worsening renal function CBC reveals white count of 10.8 with an ANC of 9000, normal hemoglobin and platelet count.  BMP with glucose of 34, creatinine 2. Troponin normal. Chest x-ray without acute pathology.  EKG with sinus bradycardia.     Assessment & Plan:   Active Problems:   Hypoglycemia   Bradycardia  1] hypoglycemia-symptomatic patient admitted with confusion. Blood sugars have been improving,highest of 267 last night . Patient has been evaluated by diabetes coordinator and they recommend 20 units of Lantus. At this time we will start at 15 and monitor him for about 24 hours and see what his Accu-Cheks look like..  hold his home dose of Lantus  and Januvia and plan discharge tomorrow.  Patient and his family feels they are not ready to go home because they do not know what to do and how much insulin to give him.  Hemoglobin A1c is 8.1.   Physical therapy recommends outpatient physical therapy.  Patient's daughter is by the bedside and this  Plan was discussed with her  2] hypertension- Home medications have been restarted.  3] history of dementia stable at this time.  4] CKD stage III stable and improving. Baseline creatinine around 1.9, creatinine stable    DVT prophylaxis: Heparin Code Status: Full code Family Communication: Discussed with the 3 children that was in the room Disposition Plan: anticipate discharge tomorrow if Accu-Chek stable for 24 hours   Consultants: None  Procedures: None Antimicrobials: None  Subjective: Sitting up in chair awake alert.  Objective: Vitals:   08/23/17 1558 08/23/17 2045  08/24/17 0438 08/24/17 0500  BP: 128/66 (!) 157/86 (!) 160/72   Pulse: (!) 59 71 (!) 52   Resp: 20 18 16    Temp: 98.2 F (36.8 C) 98 F (36.7 C) 98.2 F (36.8 C)   TempSrc: Oral Oral Oral   SpO2: 99% 99% 99%   Weight:    71.7 kg (158 lb 1.1 oz)  Height:        Intake/Output Summary (Last 24 hours) at 08/24/2017 0943 Last data filed at 08/23/2017 1917 Gross per 24 hour  Intake -  Output 576 ml  Net -576 ml   Filed Weights   08/23/17 0137 08/24/17 0500  Weight: 70.3 kg (154 lb 15.7 oz) 71.7 kg (158 lb 1.1 oz)    Examination:  General exam: Appears calm and comfortable  Respiratory system: Clear to auscultation. Respiratory effort normal. Cardiovascular system: S1 & S2 heard, RRR. No JVD, murmurs, rubs, gallops or clicks. No pedal edema. Gastrointestinal system: Abdomen is nondistended, soft and nontender. No organomegaly or masses felt. Normal bowel sounds heard. Central nervous system: Alert and oriented. No focal neurological deficits. Extremities: Symmetric 5 x 5 power.NO edema Skin: No rashes, lesions or ulcers Psychiatry: Judgement and insight appear normal. Mood & affect appropriate.     Data Reviewed: I have personally reviewed following labs and imaging studies  CBC: Recent Labs  Lab 08/22/17 2052 08/23/17 0336  WBC 10.8* 8.6  NEUTROABS 9.0*  --   HGB 15.6 14.9  HCT 48.5 44.1  MCV 93.8 90.4  PLT 141* 681*   Basic Metabolic Panel: Recent Labs  Lab 08/22/17 2052 08/23/17 0336  NA 135  135  K 4.0 3.7  CL 102 104  CO2 22 24  GLUCOSE 34* 118*  BUN 32* 28*  CREATININE 2.06* 1.75*  CALCIUM 8.9 8.3*   GFR: Estimated Creatinine Clearance: 28.3 mL/min (A) (by C-G formula based on SCr of 1.75 mg/dL (H)). Liver Function Tests: No results for input(s): AST, ALT, ALKPHOS, BILITOT, PROT, ALBUMIN in the last 168 hours. No results for input(s): LIPASE, AMYLASE in the last 168 hours. No results for input(s): AMMONIA in the last 168 hours. Coagulation  Profile: No results for input(s): INR, PROTIME in the last 168 hours. Cardiac Enzymes: No results for input(s): CKTOTAL, CKMB, CKMBINDEX, TROPONINI in the last 168 hours. BNP (last 3 results) No results for input(s): PROBNP in the last 8760 hours. HbA1C: Recent Labs    08/23/17 0336  HGBA1C 8.1*   CBG: Recent Labs  Lab 08/23/17 0812 08/23/17 1209 08/23/17 1719 08/23/17 2053 08/24/17 0747  GLUCAP 82 204* 110* 267* 112*   Lipid Profile: No results for input(s): CHOL, HDL, LDLCALC, TRIG, CHOLHDL, LDLDIRECT in the last 72 hours. Thyroid Function Tests: No results for input(s): TSH, T4TOTAL, FREET4, T3FREE, THYROIDAB in the last 72 hours. Anemia Panel: No results for input(s): VITAMINB12, FOLATE, FERRITIN, TIBC, IRON, RETICCTPCT in the last 72 hours. Sepsis Labs: No results for input(s): PROCALCITON, LATICACIDVEN in the last 168 hours.  No results found for this or any previous visit (from the past 240 hour(s)).       Radiology Studies: Dg Chest 2 View  Result Date: 08/22/2017 CLINICAL DATA:  Lethargy. EXAM: CHEST - 2 VIEW COMPARISON:  10/30/2012 FINDINGS: AP and lateral view show low volumes. The lungs are clear without focal pneumonia, edema, pneumothorax or pleural effusion. The cardiopericardial silhouette is within normal limits for size. The visualized bony structures of the thorax are intact. Telemetry leads overlie the chest. IMPRESSION: No active cardiopulmonary disease. Electronically Signed   By: Misty Stanley M.D.   On: 08/22/2017 20:41        Scheduled Meds: . heparin  5,000 Units Subcutaneous Q8H  . insulin aspart  0-9 Units Subcutaneous TID WC  . insulin glargine  15 Units Subcutaneous Daily  . metoprolol succinate  25 mg Oral Daily  . sodium chloride flush  3 mL Intravenous Q12H   Continuous Infusions:   LOS: 0 days      Reyne Dumas, MD [facility] Pager [phone]  If 7PM-7AM, please contact night-coverage [email or web address] Password  [facility] 08/24/2017, 9:43 AM

## 2017-08-24 NOTE — Progress Notes (Signed)
Occupational Therapy Treatment Patient Details Name: Tony Vasquez MRN: [id number] DOB: 08-28-1932 Today's Date: 08/24/2017    History of present illness Admitted with confusion, diaphortic, decreased balance; found to have episode of low blood sugar; apparently has run low for 3-5 days leading up to going to ED;  has a past medical history of At risk for sleep apnea, Bilateral renal cysts, Dyslipidemia, Hydronephrosis of left kidney, Hypertension, Mild renal insufficiency, OA (osteoarthritis), Prostate cancer ([place]), Radiation (01/16/14-03/16/14), and Type 2 diabetes mellitus ([place]).   OT comments  Pt progressing well towards OT goals this session. Able to complete in room mobility using furniture for balance and HHA. Pt also able to complete sponge bath at sink including grooming utilizing sit to stand for safety, daughter present throughout. Dc recommendations remain appropriate.   Follow Up Recommendations  Supervision/Assistance - 24 hour;No OT follow up    Equipment Recommendations  None recommended by OT    Recommendations for Other Services      Precautions / Restrictions Precautions Precautions: Fall Restrictions Weight Bearing Restrictions: No       Mobility Bed Mobility Overal bed mobility: Independent                Transfers Overall transfer level: Needs assistance Equipment used: None Transfers: Sit to/from Stand Sit to Stand: Min guard         General transfer comment: Noted dependent on UEs to push off and stabilize, furniture walked to sink area    Balance Overall balance assessment: Needs assistance Sitting-balance support: No upper extremity supported;Feet supported Sitting balance-Leahy Scale: Good Sitting balance - Comments: good dynamic sitting balance   Standing balance support: Single extremity supported;No upper extremity supported;During functional activity Standing balance-Leahy Scale: Fair Standing balance comment: did need min guard A  for dynamic balance                           ADL either performed or assessed with clinical judgement   ADL Overall ADL's : Needs assistance/impaired     Grooming: Min guard;Standing;Wash/dry face;Oral care;Applying deodorant Grooming Details (indicate cue type and reason): able to manage and sequence all grooming tasks Upper Body Bathing: Set up;Sitting   Lower Body Bathing: Min guard;Sit to/from stand Lower Body Bathing Details (indicate cue type and reason): able to reach feet and both peri areas Upper Body Dressing : Set up;Sitting   Lower Body Dressing: Min guard;Sit to/from stand Lower Body Dressing Details (indicate cue type and reason): able to don socks by crossing feet to knees Toilet Transfer: Min guard;Ambulation;Regular Museum/gallery exhibitions officer and Hygiene: Min guard;Sit to/from stand       Functional mobility during ADLs: Min guard(1 person HHA for in room)       Vision       Perception     Praxis      Cognition Arousal/Alertness: Awake/alert Behavior During Therapy: WFL for tasks assessed/performed Overall Cognitive Status: History of cognitive impairments - at baseline                                          Exercises     Shoulder Instructions       General Comments Daughter present during session    Pertinent Vitals/ Pain       Pain Assessment: No/denies pain  Home Living  Prior Functioning/Environment              Frequency           Progress Toward Goals  OT Goals(current goals can now be found in the care plan section)  Progress towards OT goals: Progressing toward goals  Acute Rehab OT Goals Patient Stated Goal: to go home OT Goal Formulation: With patient Time For Goal Achievement: 09/06/17 Potential to Achieve Goals: Good  Plan Discharge plan remains appropriate;Frequency remains appropriate     Co-evaluation                 AM-PAC PT "6 Clicks" Daily Activity     Outcome Measure   Help from another person eating meals?: None Help from another person taking care of personal grooming?: A Little Help from another person toileting, which includes using toliet, bedpan, or urinal?: A Little Help from another person bathing (including washing, rinsing, drying)?: A Little Help from another person to put on and taking off regular upper body clothing?: A Little Help from another person to put on and taking off regular lower body clothing?: A Little 6 Click Score: 19    End of Session    OT Visit Diagnosis: Unsteadiness on feet (R26.81)   Activity Tolerance Patient tolerated treatment well   Patient Left Other (comment);with family/visitor present;with nursing/sitter in room(sitting at sink with RN and wrapping up bath with shower cap)   Nurse Communication Mobility status        Time: 6861-6837 OT Time Calculation (min): 20 min  Charges: OT General Charges $OT Visit: 1 Visit OT Treatments $Self Care/Home Management : 8-22 mins  Hulda Humphrey OTR/L [place] 08/24/2017, 10:01 AM

## 2017-08-25 DIAGNOSIS — R066 Hiccough: Secondary | ICD-10-CM | POA: Diagnosis not present

## 2017-08-25 DIAGNOSIS — E0800 Diabetes mellitus due to underlying condition with hyperosmolarity without nonketotic hyperglycemic-hyperosmolar coma (NKHHC): Secondary | ICD-10-CM | POA: Diagnosis not present

## 2017-08-25 DIAGNOSIS — R001 Bradycardia, unspecified: Secondary | ICD-10-CM | POA: Diagnosis not present

## 2017-08-25 LAB — CBC
HEMATOCRIT: 46.1 % (ref 39.0–52.0)
HEMOGLOBIN: 15.1 g/dL (ref 13.0–17.0)
MCH: 30.1 pg (ref 26.0–34.0)
MCHC: 32.8 g/dL (ref 30.0–36.0)
MCV: 92 fL (ref 78.0–100.0)
Platelets: 141 10*3/uL — ABNORMAL LOW (ref 150–400)
RBC: 5.01 MIL/uL (ref 4.22–5.81)
RDW: 12.3 % (ref 11.5–15.5)
WBC: 7.7 10*3/uL (ref 4.0–10.5)

## 2017-08-25 LAB — GLUCOSE, CAPILLARY
Glucose-Capillary: 127 mg/dL — ABNORMAL HIGH (ref 70–99)
Glucose-Capillary: 185 mg/dL — ABNORMAL HIGH (ref 70–99)

## 2017-08-25 LAB — BASIC METABOLIC PANEL
Anion gap: 10 (ref 5–15)
BUN: 26 mg/dL — ABNORMAL HIGH (ref 8–23)
CALCIUM: 8.5 mg/dL — AB (ref 8.9–10.3)
CO2: 27 mmol/L (ref 22–32)
CREATININE: 1.79 mg/dL — AB (ref 0.61–1.24)
Chloride: 102 mmol/L (ref 98–111)
GFR calc Af Amer: 39 mL/min — ABNORMAL LOW (ref >60)
GFR calc non Af Amer: 34 mL/min — ABNORMAL LOW (ref >60)
GLUCOSE: 156 mg/dL — AB (ref 70–99)
Potassium: 4.4 mmol/L (ref 3.5–5.1)
Sodium: 139 mmol/L (ref 135–145)

## 2017-08-25 MED ORDER — INSULIN LISPRO 100 UNIT/ML (KWIKPEN): 3.0000 [IU] | PEN_INJECTOR | Freq: Two times a day (BID) | SUBCUTANEOUS | 11 refills | Status: AC

## 2017-08-25 MED ORDER — INSULIN GLARGINE 100 UNIT/ML ~~LOC~~ SOLN: 15.0000 [IU] | Freq: Every day | SUBCUTANEOUS | 11 refills | Status: AC

## 2017-08-25 NOTE — Discharge Summary (Signed)
Physician Discharge Summary  Tony Vasquez MRN: [id number] DOB/AGE: 06/16/1932 84 y.o.  PCP: Haywood Pao, MD   Admit date: 08/22/2017 Discharge date: 08/25/2017  Discharge Diagnoses:    Active Problems:   Hypoglycemia   Bradycardia    Follow-up recommendations Follow-up with PCP in 3-5 days , including all  additional recommended appointments as below Follow-up CBC, CMP in 3-5 days Continue Accu-Cheks 3 times a day      Allergies as of 08/25/2017      Reactions   Shrimp [shellfish Allergy] Anaphylaxis   Augmentin [amoxicillin-pot Clavulanate] Rash   Fever and rash compatible with lower extremity leukocytoclastic vasculitis occurring after he was treated with Augmentin then switched to Rocephin. Was difficult to determine if it was do to Augmentin or both beta lactam agents.      Medication List    STOP taking these medications   LANTUS SOLOSTAR 100 UNIT/ML Solostar Pen Generic drug:  Insulin Glargine Replaced by:  insulin glargine 100 UNIT/ML injection   sitaGLIPtin 50 MG tablet Commonly known as:  JANUVIA     TAKE these medications   Alcohol Prep 70 % Pads   CALCIUM-D PO Take 1 tablet by mouth daily.   furosemide 20 MG tablet Commonly known as:  LASIX Take 40 mg by mouth daily.   insulin glargine 100 UNIT/ML injection Commonly known as:  LANTUS Inject 0.15 mLs (15 Units total) into the skin daily. Replaces:  LANTUS SOLOSTAR 100 UNIT/ML Solostar Pen   insulin lispro 100 UNIT/ML KiwkPen Commonly known as:  HUMALOG KWIKPEN Inject 0.03 mLs (3 Units total) into the skin 2 (two) times daily with breakfast and lunch. What changed:  how much to take   metoprolol succinate 25 MG 24 hr tablet Commonly known as:  TOPROL-XL Take 25 mg by mouth daily.        Discharge Condition:  stable  Discharge Instructions Get Medicines reviewed and adjusted: Please take all your medications with you for your next visit with your Primary MD  Please request  your Primary MD to go over all hospital tests and procedure/radiological results at the follow up, please ask your Primary MD to get all Hospital records sent to his/her office.  If you experience worsening of your admission symptoms, develop shortness of breath, life threatening emergency, suicidal or homicidal thoughts you must seek medical attention immediately by calling 911 or calling your MD immediately  if symptoms less severe.  You must read complete instructions/literature along with all the possible adverse reactions/side effects for all the Medicines you take and that have been prescribed to you. Take any new Medicines after you have completely understood and accpet all the possible adverse reactions/side effects.   Do not drive when taking Pain medications.   Do not take more than prescribed Pain, Sleep and Anxiety Medications  Special Instructions: If you have smoked or chewed Tobacco  in the last 2 yrs please stop smoking, stop any regular Alcohol  and or any Recreational drug use.  Wear Seat belts while driving.  Please note  You were cared for by a hospitalist during your hospital stay. Once you are discharged, your primary care physician will handle any further medical issues. Please note that NO REFILLS for any discharge medications will be authorized once you are discharged, as it is imperative that you return to your primary care physician (or establish a relationship with a primary care physician if you do not have one) for your aftercare needs so that they can  reassess your need for medications and monitor your lab values.  Discharge Instructions    Ambulatory referral to Physical Therapy   Complete by:  As directed    Evaluate and treat   Discharge patient   Complete by:  As directed    Discharge disposition:  01-Home or Self Care   Discharge patient date:  08/25/2017       Allergies  Allergen Reactions  . Shrimp [Shellfish Allergy] Anaphylaxis  . Augmentin  [Amoxicillin-Pot Clavulanate] Rash    Fever and rash compatible with lower extremity leukocytoclastic vasculitis occurring after he was treated with Augmentin then switched to Rocephin. Was difficult to determine if it was do to Augmentin or both beta lactam agents.      Disposition: Discharge disposition: 01-Home or Self Care        Consults: * None    Significant Diagnostic Studies:  Dg Chest 2 View  Result Date: 08/22/2017 CLINICAL DATA:  Lethargy. EXAM: CHEST - 2 VIEW COMPARISON:  10/30/2012 FINDINGS: AP and lateral view show low volumes. The lungs are clear without focal pneumonia, edema, pneumothorax or pleural effusion. The cardiopericardial silhouette is within normal limits for size. The visualized bony structures of the thorax are intact. Telemetry leads overlie the chest. IMPRESSION: No active cardiopulmonary disease. Electronically Signed   By: Misty Stanley M.D.   On: 08/22/2017 20:41        Filed Weights   08/23/17 0137 08/24/17 0500 08/24/17 2137  Weight: 70.3 kg (154 lb 15.7 oz) 71.7 kg (158 lb 1.1 oz) 74 kg (163 lb 2.3 oz)     Microbiology: No results found for this or any previous visit (from the past 240 hour(s)).     Blood Culture    Component Value Date/Time   SDES NOSE 10/31/2012 0520   SPECREQUEST NONE 10/31/2012 0520   CULT  10/31/2012 0520    NO STAPHYLOCOCCUS AUREUS ISOLATED Note: NOMRSA Performed at [facility] 11/02/2012 FINAL 10/31/2012 0520      Labs: Results for orders placed or performed during the hospital encounter of 08/22/17 (from the past 48 hour(s))  Glucose, capillary     Status: Abnormal   Collection Time: 08/23/17 12:09 PM  Result Value Ref Range   Glucose-Capillary 204 (H) 70 - 99 mg/dL  Glucose, capillary     Status: Abnormal   Collection Time: 08/23/17  5:19 PM  Result Value Ref Range   Glucose-Capillary 110 (H) 70 - 99 mg/dL  Glucose, capillary     Status: Abnormal   Collection Time:  08/23/17  8:53 PM  Result Value Ref Range   Glucose-Capillary 267 (H) 70 - 99 mg/dL  Glucose, capillary     Status: Abnormal   Collection Time: 08/24/17  7:47 AM  Result Value Ref Range   Glucose-Capillary 112 (H) 70 - 99 mg/dL  Glucose, capillary     Status: Abnormal   Collection Time: 08/24/17 11:27 AM  Result Value Ref Range   Glucose-Capillary 239 (H) 70 - 99 mg/dL  Glucose, capillary     Status: Abnormal   Collection Time: 08/24/17  4:41 PM  Result Value Ref Range   Glucose-Capillary 147 (H) 70 - 99 mg/dL  Glucose, capillary     Status: Abnormal   Collection Time: 08/24/17  9:36 PM  Result Value Ref Range   Glucose-Capillary 246 (H) 70 - 99 mg/dL   Comment 1 Notify RN   CBC     Status: Abnormal   Collection Time: 08/25/17  4:37 AM  Result Value Ref Range   WBC 7.7 4.0 - 10.5 K/uL   RBC 5.01 4.22 - 5.81 MIL/uL   Hemoglobin 15.1 13.0 - 17.0 g/dL   HCT 46.1 39.0 - 52.0 %   MCV 92.0 78.0 - 100.0 fL   MCH 30.1 26.0 - 34.0 pg   MCHC 32.8 30.0 - 36.0 g/dL   RDW 12.3 11.5 - 15.5 %   Platelets 141 (L) 150 - 400 K/uL    Comment: Performed at [facility] [address], [place], [place] [address]  Basic metabolic panel     Status: Abnormal   Collection Time: 08/25/17  4:37 AM  Result Value Ref Range   Sodium 139 135 - 145 mmol/L   Potassium 4.4 3.5 - 5.1 mmol/L   Chloride 102 98 - 111 mmol/L    Comment: Please note change in reference range.   CO2 27 22 - 32 mmol/L   Glucose, Bld 156 (H) 70 - 99 mg/dL    Comment: Please note change in reference range.   BUN 26 (H) 8 - 23 mg/dL    Comment: Please note change in reference range.   Creatinine, Ser 1.79 (H) 0.61 - 1.24 mg/dL   Calcium 8.5 (L) 8.9 - 10.3 mg/dL   GFR calc non Af Amer 34 (L) >60 mL/min   GFR calc Af Amer 39 (L) >60 mL/min    Comment: (NOTE) The eGFR has been calculated using the CKD EPI equation. This calculation has not been validated in all clinical situations. eGFR's persistently <60 mL/min signify  possible Chronic Kidney Disease.    Anion gap 10 5 - 15    Comment: Performed at [facility] [address], [place], [place] [address]  Glucose, capillary     Status: Abnormal   Collection Time: 08/25/17  7:40 AM  Result Value Ref Range   Glucose-Capillary 127 (H) 70 - 99 mg/dL     Lipid Panel  No results found for: CHOL, TRIG, HDL, CHOLHDL, VLDL, LDLCALC, LDLDIRECT   Lab Results  Component Value Date   HGBA1C 8.1 (H) 08/23/2017     Lab Results  Component Value Date   CREATININE 1.79 (H) 08/25/2017     HPI :  84 year old man with medical problems including insulin-dependent diabetes, localized prostate cancer, chronic kidney disease with baseline creatinine of 2, dementia, hypertension presents with confusion hypoglycemia in the setting of worsening renal function CBC reveals white count of 10.8 with an ANC of 9000, normal hemoglobin and platelet count. BMP with glucose of 34, creatinine 2. Troponin normal. Chest x-ray without acute pathology. EKG with sinus bradycardia.    HOSPITAL COURSE:  1] hypoglycemia-symptomatic patient admitted with confusion. Blood sugars have been improving,  . Patient has been evaluated by diabetes coordinator and  We have decreased the dose of Lantus to 15 units . Accu-Cheks look stable.  hold his home dose of Lantus  and Januvia .    Hemoglobin A1c is 8.1 nearly at goal for his age based on the new guidelines.   Physical therapy recommends outpatient physical therapy which has been ordered.    2] hypertension- Home medications have been restarted.  3] history of dementia stable at this time.  4] CKD stage III stable and improving. Baseline creatinine around 1.9, creatinine stable     1.79 Discharge Exam:  Blood pressure (!) 154/75, pulse (!) 47, temperature 97.8 F (36.6 C), temperature source Oral, resp. rate 18, height _0  (1.651  m), weight 74 kg (163 lb 2.3 oz), SpO2 100 %. Cardiovascular system: S1 & S2 heard, RRR. No  JVD, murmurs, rubs, gallops or clicks. No pedal edema. Gastrointestinal system: Abdomen is nondistended, soft and nontender. No organomegaly or masses felt. Normal bowel sounds heard. Central nervous system: Alert and oriented. No focal neurological deficits. Extremities: Symmetric 5 x 5 power.NO edema Skin: No rashes, lesions or ulcers Psychiatry: Judgement and insight appear normal. Mood & affect appropriate.         Follow-up Information    [name], [name], MD. Call.   Specialty:  Internal Medicine Why:  Call PCP for appointment, Hospital follow-up in 3-5 days Contact information: [place] [address] [phone]           Signed: Reyne Vasquez 08/25/2017, 8:52 AM      Time needed to  prevent discharge, discussed with the patient and family 35 minutes

## 2018-10-08 ENCOUNTER — Inpatient Hospital Stay (HOSPITAL_COMMUNITY)
Admission: EM | Admit: 2018-10-08 | Discharge: 2018-10-14 | DRG: 178 | Disposition: A | Discharge status: Home Health | Payer: Medicare Other | Attending: Internal Medicine | Admitting: Internal Medicine

## 2018-10-08 ENCOUNTER — Other Ambulatory Visit: Payer: Self-pay

## 2018-10-08 DIAGNOSIS — Z794 Long term (current) use of insulin: Secondary | ICD-10-CM

## 2018-10-08 DIAGNOSIS — N179 Acute kidney failure, unspecified: Secondary | ICD-10-CM | POA: Diagnosis present

## 2018-10-08 DIAGNOSIS — E1122 Type 2 diabetes mellitus with diabetic chronic kidney disease: Secondary | ICD-10-CM | POA: Diagnosis present

## 2018-10-08 DIAGNOSIS — C61 Malignant neoplasm of prostate: Secondary | ICD-10-CM | POA: Diagnosis present

## 2018-10-08 DIAGNOSIS — U071 COVID-19: Principal | ICD-10-CM | POA: Diagnosis present

## 2018-10-08 DIAGNOSIS — I10 Essential (primary) hypertension: Secondary | ICD-10-CM | POA: Diagnosis present

## 2018-10-08 DIAGNOSIS — E876 Hypokalemia: Secondary | ICD-10-CM | POA: Diagnosis present

## 2018-10-08 DIAGNOSIS — N289 Disorder of kidney and ureter, unspecified: Secondary | ICD-10-CM

## 2018-10-08 DIAGNOSIS — IMO0002 Reserved for concepts with insufficient information to code with codable children: Secondary | ICD-10-CM | POA: Diagnosis present

## 2018-10-08 DIAGNOSIS — F0390 Unspecified dementia without behavioral disturbance: Secondary | ICD-10-CM | POA: Diagnosis present

## 2018-10-08 DIAGNOSIS — E785 Hyperlipidemia, unspecified: Secondary | ICD-10-CM | POA: Diagnosis present

## 2018-10-08 DIAGNOSIS — Z8042 Family history of malignant neoplasm of prostate: Secondary | ICD-10-CM

## 2018-10-08 DIAGNOSIS — Z923 Personal history of irradiation: Secondary | ICD-10-CM

## 2018-10-08 DIAGNOSIS — R531 Weakness: Secondary | ICD-10-CM

## 2018-10-08 DIAGNOSIS — R9431 Abnormal electrocardiogram [ECG] [EKG]: Secondary | ICD-10-CM | POA: Diagnosis present

## 2018-10-08 DIAGNOSIS — Z79899 Other long term (current) drug therapy: Secondary | ICD-10-CM

## 2018-10-08 DIAGNOSIS — Z8546 Personal history of malignant neoplasm of prostate: Secondary | ICD-10-CM

## 2018-10-08 DIAGNOSIS — E1165 Type 2 diabetes mellitus with hyperglycemia: Secondary | ICD-10-CM | POA: Diagnosis present

## 2018-10-08 DIAGNOSIS — Z91013 Allergy to seafood: Secondary | ICD-10-CM

## 2018-10-08 DIAGNOSIS — E860 Dehydration: Secondary | ICD-10-CM | POA: Diagnosis present

## 2018-10-08 DIAGNOSIS — I129 Hypertensive chronic kidney disease with stage 1 through stage 4 chronic kidney disease, or unspecified chronic kidney disease: Secondary | ICD-10-CM | POA: Diagnosis present

## 2018-10-08 DIAGNOSIS — N183 Chronic kidney disease, stage 3 unspecified: Secondary | ICD-10-CM | POA: Diagnosis present

## 2018-10-08 DIAGNOSIS — Z833 Family history of diabetes mellitus: Secondary | ICD-10-CM

## 2018-10-08 DIAGNOSIS — R001 Bradycardia, unspecified: Secondary | ICD-10-CM | POA: Diagnosis present

## 2018-10-08 NOTE — ED Triage Notes (Signed)
Pt BIB [facility] from home. EMS was called out d/t pt CBG being 180 and pt experiencing weakness. When EMS arrived CBG was 110, pt was unable to walk d/t weakness. According to EMS pt is able to walk normally. Pt is A&Ox2.

## 2018-10-09 ENCOUNTER — Inpatient Hospital Stay (HOSPITAL_COMMUNITY): Payer: Medicare Other

## 2018-10-09 ENCOUNTER — Emergency Department (HOSPITAL_COMMUNITY): Payer: Medicare Other

## 2018-10-09 ENCOUNTER — Encounter (HOSPITAL_COMMUNITY): Payer: Self-pay | Admitting: Emergency Medicine

## 2018-10-09 ENCOUNTER — Other Ambulatory Visit: Payer: Self-pay

## 2018-10-09 DIAGNOSIS — I129 Hypertensive chronic kidney disease with stage 1 through stage 4 chronic kidney disease, or unspecified chronic kidney disease: Secondary | ICD-10-CM | POA: Diagnosis not present

## 2018-10-09 DIAGNOSIS — Z8546 Personal history of malignant neoplasm of prostate: Secondary | ICD-10-CM | POA: Diagnosis not present

## 2018-10-09 DIAGNOSIS — E1122 Type 2 diabetes mellitus with diabetic chronic kidney disease: Secondary | ICD-10-CM | POA: Diagnosis not present

## 2018-10-09 DIAGNOSIS — E785 Hyperlipidemia, unspecified: Secondary | ICD-10-CM | POA: Diagnosis present

## 2018-10-09 DIAGNOSIS — U071 COVID-19: Secondary | ICD-10-CM | POA: Diagnosis not present

## 2018-10-09 DIAGNOSIS — N179 Acute kidney failure, unspecified: Secondary | ICD-10-CM | POA: Diagnosis not present

## 2018-10-09 DIAGNOSIS — R531 Weakness: Secondary | ICD-10-CM

## 2018-10-09 DIAGNOSIS — E860 Dehydration: Secondary | ICD-10-CM | POA: Diagnosis not present

## 2018-10-09 DIAGNOSIS — Z833 Family history of diabetes mellitus: Secondary | ICD-10-CM | POA: Diagnosis not present

## 2018-10-09 DIAGNOSIS — Z8042 Family history of malignant neoplasm of prostate: Secondary | ICD-10-CM | POA: Diagnosis not present

## 2018-10-09 DIAGNOSIS — F0390 Unspecified dementia without behavioral disturbance: Secondary | ICD-10-CM | POA: Diagnosis present

## 2018-10-09 DIAGNOSIS — E118 Type 2 diabetes mellitus with unspecified complications: Secondary | ICD-10-CM | POA: Diagnosis not present

## 2018-10-09 DIAGNOSIS — Z91013 Allergy to seafood: Secondary | ICD-10-CM | POA: Diagnosis not present

## 2018-10-09 DIAGNOSIS — E876 Hypokalemia: Secondary | ICD-10-CM | POA: Diagnosis present

## 2018-10-09 DIAGNOSIS — Z79899 Other long term (current) drug therapy: Secondary | ICD-10-CM | POA: Diagnosis not present

## 2018-10-09 DIAGNOSIS — N183 Chronic kidney disease, stage 3 (moderate): Secondary | ICD-10-CM | POA: Diagnosis not present

## 2018-10-09 DIAGNOSIS — Z923 Personal history of irradiation: Secondary | ICD-10-CM | POA: Diagnosis not present

## 2018-10-09 DIAGNOSIS — C61 Malignant neoplasm of prostate: Secondary | ICD-10-CM | POA: Diagnosis not present

## 2018-10-09 DIAGNOSIS — R9431 Abnormal electrocardiogram [ECG] [EKG]: Secondary | ICD-10-CM | POA: Diagnosis not present

## 2018-10-09 DIAGNOSIS — E1165 Type 2 diabetes mellitus with hyperglycemia: Secondary | ICD-10-CM | POA: Diagnosis present

## 2018-10-09 DIAGNOSIS — I10 Essential (primary) hypertension: Secondary | ICD-10-CM | POA: Diagnosis not present

## 2018-10-09 DIAGNOSIS — Z794 Long term (current) use of insulin: Secondary | ICD-10-CM | POA: Diagnosis not present

## 2018-10-09 LAB — COMPREHENSIVE METABOLIC PANEL
ALT: 16 U/L (ref 0–44)
AST: 25 U/L (ref 15–41)
Albumin: 4.7 g/dL (ref 3.5–5.0)
Alkaline Phosphatase: 56 U/L (ref 38–126)
Anion gap: 12 (ref 5–15)
BUN: 32 mg/dL — ABNORMAL HIGH (ref 8–23)
CO2: 28 mmol/L (ref 22–32)
Calcium: 9.9 mg/dL (ref 8.9–10.3)
Chloride: 101 mmol/L (ref 98–111)
Creatinine, Ser: 2.16 mg/dL — ABNORMAL HIGH (ref 0.61–1.24)
GFR calc Af Amer: 32 mL/min — ABNORMAL LOW (ref >60)
GFR calc non Af Amer: 27 mL/min — ABNORMAL LOW (ref >60)
Glucose, Bld: 113 mg/dL — ABNORMAL HIGH (ref 70–99)
Potassium: 4.1 mmol/L (ref 3.5–5.1)
Sodium: 141 mmol/L (ref 135–145)
Total Bilirubin: 0.7 mg/dL (ref 0.3–1.2)
Total Protein: 9.1 g/dL — ABNORMAL HIGH (ref 6.5–8.1)

## 2018-10-09 LAB — CBC WITH DIFFERENTIAL/PLATELET
Abs Immature Granulocytes: 0.03 10*3/uL (ref 0.00–0.07)
Basophils Absolute: 0 10*3/uL (ref 0.0–0.1)
Basophils Relative: 1 %
Eosinophils Absolute: 0 10*3/uL (ref 0.0–0.5)
Eosinophils Relative: 1 %
HCT: 48.2 % (ref 39.0–52.0)
Hemoglobin: 15.9 g/dL (ref 13.0–17.0)
Immature Granulocytes: 1 %
Lymphocytes Relative: 20 %
Lymphs Abs: 1.3 10*3/uL (ref 0.7–4.0)
MCH: 30.7 pg (ref 26.0–34.0)
MCHC: 33 g/dL (ref 30.0–36.0)
MCV: 93.1 fL (ref 80.0–100.0)
Monocytes Absolute: 0.7 10*3/uL (ref 0.1–1.0)
Monocytes Relative: 11 %
Neutro Abs: 4.3 10*3/uL (ref 1.7–7.7)
Neutrophils Relative %: 66 %
Platelets: 162 10*3/uL (ref 150–400)
RBC: 5.18 MIL/uL (ref 4.22–5.81)
RDW: 12.7 % (ref 11.5–15.5)
WBC: 6.4 10*3/uL (ref 4.0–10.5)
nRBC: 0 % (ref 0.0–0.2)

## 2018-10-09 LAB — URINALYSIS, ROUTINE W REFLEX MICROSCOPIC
Bacteria, UA: NONE SEEN
Bilirubin Urine: NEGATIVE
Glucose, UA: NEGATIVE mg/dL
Ketones, ur: NEGATIVE mg/dL
Leukocytes,Ua: NEGATIVE
Nitrite: NEGATIVE
Protein, ur: NEGATIVE mg/dL
Specific Gravity, Urine: 1.009 (ref 1.005–1.030)
pH: 5 (ref 5.0–8.0)

## 2018-10-09 LAB — HEMOGLOBIN A1C
Hgb A1c MFr Bld: 8.7 % — ABNORMAL HIGH (ref 4.8–5.6)
Mean Plasma Glucose: 202.99 mg/dL

## 2018-10-09 LAB — SARS CORONAVIRUS 2 BY RT PCR (HOSPITAL ORDER, PERFORMED IN ~~LOC~~ HOSPITAL LAB): SARS Coronavirus 2: POSITIVE — AB

## 2018-10-09 LAB — CBG MONITORING, ED
Glucose-Capillary: 108 mg/dL — ABNORMAL HIGH (ref 70–99)
Glucose-Capillary: 124 mg/dL — ABNORMAL HIGH (ref 70–99)
Glucose-Capillary: 144 mg/dL — ABNORMAL HIGH (ref 70–99)

## 2018-10-09 LAB — TSH: TSH: 2.973 u[IU]/mL (ref 0.350–4.500)

## 2018-10-09 LAB — GLUCOSE, CAPILLARY: Glucose-Capillary: 157 mg/dL — ABNORMAL HIGH (ref 70–99)

## 2018-10-09 MED ORDER — METHYLPREDNISOLONE SODIUM SUCC 40 MG IJ SOLR
40.0000 mg | Freq: Two times a day (BID) | INTRAMUSCULAR | Status: AC
  Administered 2018-10-09 – 2018-10-10 (×2): 40 mg via INTRAVENOUS
  Filled 2018-10-09 (×2): qty 1

## 2018-10-09 MED ORDER — ONDANSETRON HCL 4 MG/2ML IJ SOLN: 4.0000 mg | Freq: Four times a day (QID) | INTRAMUSCULAR | Status: DC | PRN

## 2018-10-09 MED ORDER — HEPARIN SODIUM (PORCINE) 5000 UNIT/ML IJ SOLN
5000.0000 [IU] | Freq: Three times a day (TID) | INTRAMUSCULAR | Status: DC
  Administered 2018-10-09 – 2018-10-14 (×15): 5000 [IU] via SUBCUTANEOUS
  Filled 2018-10-09 (×17): qty 1

## 2018-10-09 MED ORDER — VITAMIN D 25 MCG (1000 UNIT) PO TABS
1000.0000 [IU] | ORAL_TABLET | Freq: Every day | ORAL | Status: DC
  Administered 2018-10-09 – 2018-10-14 (×6): 1000 [IU] via ORAL
  Filled 2018-10-09 (×6): qty 1

## 2018-10-09 MED ORDER — METHYLPREDNISOLONE SODIUM SUCC 125 MG IJ SOLR: 60.0000 mg | Freq: Two times a day (BID) | INTRAMUSCULAR | Status: DC

## 2018-10-09 MED ORDER — LORAZEPAM 2 MG/ML IJ SOLN
1.0000 mg | Freq: Once | INTRAMUSCULAR | Status: AC
  Administered 2018-10-09: 10:00:00 1 mg via INTRAVENOUS
  Filled 2018-10-09: qty 1

## 2018-10-09 MED ORDER — INSULIN ASPART 100 UNIT/ML ~~LOC~~ SOLN
0.0000 [IU] | Freq: Three times a day (TID) | SUBCUTANEOUS | Status: DC
  Administered 2018-10-09: 1 [IU] via SUBCUTANEOUS
  Administered 2018-10-09: 2 [IU] via SUBCUTANEOUS
  Administered 2018-10-10: 3 [IU] via SUBCUTANEOUS
  Administered 2018-10-10: 9 [IU] via SUBCUTANEOUS
  Filled 2018-10-09: qty 0.09

## 2018-10-09 MED ORDER — ZINC SULFATE 220 (50 ZN) MG PO CAPS
220.0000 mg | ORAL_CAPSULE | Freq: Every day | ORAL | Status: DC
  Administered 2018-10-09 – 2018-10-14 (×6): 220 mg via ORAL
  Filled 2018-10-09 (×6): qty 1

## 2018-10-09 MED ORDER — ACETAMINOPHEN 325 MG PO TABS
650.0000 mg | ORAL_TABLET | Freq: Four times a day (QID) | ORAL | Status: DC | PRN
  Filled 2018-10-09: qty 2

## 2018-10-09 MED ORDER — ALBUTEROL SULFATE HFA 108 (90 BASE) MCG/ACT IN AERS
2.0000 | INHALATION_SPRAY | Freq: Four times a day (QID) | RESPIRATORY_TRACT | Status: DC | PRN
  Filled 2018-10-09: qty 6.7

## 2018-10-09 MED ORDER — SODIUM CHLORIDE 0.45 % IV SOLN
INTRAVENOUS | Status: DC
  Administered 2018-10-09: 18:00:00 via INTRAVENOUS

## 2018-10-09 MED ORDER — VITAMIN C 500 MG PO TABS
1000.0000 mg | ORAL_TABLET | Freq: Every day | ORAL | Status: DC
  Administered 2018-10-09 – 2018-10-14 (×6): 1000 mg via ORAL
  Filled 2018-10-09 (×6): qty 2

## 2018-10-09 MED ORDER — INSULIN ASPART 100 UNIT/ML ~~LOC~~ SOLN
0.0000 [IU] | Freq: Every day | SUBCUTANEOUS | Status: DC
  Filled 2018-10-09: qty 0.05

## 2018-10-09 MED ORDER — METOPROLOL SUCCINATE ER 25 MG PO TB24
12.5000 mg | ORAL_TABLET | Freq: Every day | ORAL | Status: DC
  Administered 2018-10-10: 12.5 mg via ORAL
  Filled 2018-10-09 (×2): qty 0.5

## 2018-10-09 NOTE — ED Notes (Signed)
Unsuccessful walking patient. Patient could not lift right leg. Patient had no problem moving left leg.

## 2018-10-09 NOTE — ED Notes (Signed)
Pt transported to MRI 

## 2018-10-09 NOTE — ED Notes (Signed)
MRI Called 

## 2018-10-09 NOTE — Progress Notes (Signed)
MRI was attempted on pt 2x. Second time was with meds to relax him but he wasn't able to drop his head back enough for [place] to put the helmet on him.

## 2018-10-09 NOTE — ED Notes (Signed)
ED TO INPATIENT HANDOFF REPORT  Name/Age/Gender Tony Vasquez 85 y.o. male  Code Status    Code Status Orders  (From admission, onward)         Start     Ordered   10/09/18 0741  Full code  Continuous     10/09/18 0754        Code Status History    Date Active Date Inactive Code Status Order ID Comments User Context   08/23/2017 0134 08/25/2017 1735 Full Code [id number]  Vilma Prader, MD Inpatient   10/31/2012 0412 11/02/2012 1425 Full Code [id number]  Velna Hatchet, MD Inpatient   10/24/2012 0648 10/26/2012 1913 Full Code [id number]  Haywood Pao, MD Inpatient   05/14/2011 0456 05/15/2011 1336 Full Code [id number]  Saunders Revel, RN Inpatient   Advance Care Planning Activity      Home/SNF/Other Home  Chief Complaint mobility issues  Level of Care/Admitting Diagnosis ED Disposition    ED Disposition Condition [facility] Area: [place] [id number]  Level of Care: Med-Surg [16]  Covid Evaluation: Confirmed COVID Positive  Diagnosis: Generalized weakness [id number]  Admitting Physician: Kayleen Memos [id number]  Attending Physician: Kayleen Memos [id number]  Estimated length of stay: past midnight tomorrow  Certification:: I certify this patient will need inpatient services for at least 2 midnights  PT Class (Do Not Modify): Inpatient [101]  PT Acc Code (Do Not Modify): Private [1]       Medical History Past Medical History:  Diagnosis Date  . At risk for sleep apnea    STOP-BANG= 4    SENT TO PCP 11-16-2013  . Bilateral renal cysts   . Dyslipidemia   . Hydronephrosis of left kidney   . Hypertension   . Mild renal insufficiency   . OA (osteoarthritis)   . Prostate cancer ([place])   . Radiation 01/16/14-03/16/14   Prostate 78 gray in 40 fractions  . Type 2 diabetes mellitus (HCC)     Allergies Allergies  Allergen Reactions  . Shrimp [Shellfish Allergy] Anaphylaxis  . Augmentin [Amoxicillin-Pot Clavulanate] Rash   Fever and rash compatible with lower extremity leukocytoclastic vasculitis occurring after he was treated with Augmentin then switched to Rocephin. Was difficult to determine if it was do to Augmentin or both beta lactam agents.    IV Location/Drains/Wounds Patient Lines/Drains/Airways Status   Active Line/Drains/Airways    Name:   Placement date:   Placement time:   Site:   Days:   Peripheral IV 10/09/18 Right Antecubital   10/09/18    0015    Antecubital   less than 1   Incision (Closed) 11/21/13 Perineum Other (Comment)   11/21/13    1127     1783          Labs/Imaging Results for orders placed or performed during the hospital encounter of 10/08/18 (from the past 48 hour(s))  CBG monitoring, ED     Status: Abnormal   Collection Time: 10/09/18 12:07 AM  Result Value Ref Range   Glucose-Capillary 108 (H) 70 - 99 mg/dL  Comprehensive metabolic panel     Status: Abnormal   Collection Time: 10/09/18 12:26 AM  Result Value Ref Range   Sodium 141 135 - 145 mmol/L   Potassium 4.1 3.5 - 5.1 mmol/L   Chloride 101 98 - 111 mmol/L   CO2 28 22 - 32 mmol/L   Glucose, Bld 113 (H) 70 - 99 mg/dL   BUN 32 (  H) 8 - 23 mg/dL   Creatinine, Ser 2.16 (H) 0.61 - 1.24 mg/dL   Calcium 9.9 8.9 - 10.3 mg/dL   Total Protein 9.1 (H) 6.5 - 8.1 g/dL   Albumin 4.7 3.5 - 5.0 g/dL   AST 25 15 - 41 U/L   ALT 16 0 - 44 U/L   Alkaline Phosphatase 56 38 - 126 U/L   Total Bilirubin 0.7 0.3 - 1.2 mg/dL   GFR calc non Af Amer 27 (L) >60 mL/min   GFR calc Af Amer 32 (L) >60 mL/min   Anion gap 12 5 - 15    Comment: Performed at [facility], [place] [address] [place], [place] [address]  CBC with Differential     Status: None   Collection Time: 10/09/18 12:26 AM  Result Value Ref Range   WBC 6.4 4.0 - 10.5 K/uL   RBC 5.18 4.22 - 5.81 MIL/uL   Hemoglobin 15.9 13.0 - 17.0 g/dL   HCT 48.2 39.0 - 52.0 %   MCV 93.1 80.0 - 100.0 fL   MCH 30.7 26.0 - 34.0 pg   MCHC 33.0 30.0 - 36.0 g/dL   RDW 12.7  11.5 - 15.5 %   Platelets 162 150 - 400 K/uL   nRBC 0.0 0.0 - 0.2 %   Neutrophils Relative % 66 %   Neutro Abs 4.3 1.7 - 7.7 K/uL   Lymphocytes Relative 20 %   Lymphs Abs 1.3 0.7 - 4.0 K/uL   Monocytes Relative 11 %   Monocytes Absolute 0.7 0.1 - 1.0 K/uL   Eosinophils Relative 1 %   Eosinophils Absolute 0.0 0.0 - 0.5 K/uL   Basophils Relative 1 %   Basophils Absolute 0.0 0.0 - 0.1 K/uL   Immature Granulocytes 1 %   Abs Immature Granulocytes 0.03 0.00 - 0.07 K/uL    Comment: Performed at [facility], [place] [address] [place], [place] [address]  Hemoglobin A1c     Status: Abnormal   Collection Time: 10/09/18 12:29 AM  Result Value Ref Range   Hgb A1c MFr Bld 8.7 (H) 4.8 - 5.6 %    Comment: (NOTE) Pre diabetes:          5.7%-6.4% Diabetes:              >6.4% Glycemic control for   <7.0% adults with diabetes    Mean Plasma Glucose 202.99 mg/dL    Comment: Performed at [facility], [place] [address] [place], [place] [address]  TSH     Status: None   Collection Time: 10/09/18 12:29 AM  Result Value Ref Range   TSH 2.973 0.350 - 4.500 uIU/mL    Comment: Performed by a 3rd Generation assay with a functional sensitivity of <=0.01 uIU/mL. Performed at [facility], [place] [address] [place], [place] [address]   Urinalysis, Routine w reflex microscopic     Status: Abnormal   Collection Time: 10/09/18  3:07 AM  Result Value Ref Range   Color, Urine STRAW (A) YELLOW   APPearance CLEAR CLEAR   Specific Gravity, Urine 1.009 1.005 - 1.030   pH 5.0 5.0 - 8.0   Glucose, UA NEGATIVE NEGATIVE mg/dL   Hgb urine dipstick SMALL (A) NEGATIVE   Bilirubin Urine NEGATIVE NEGATIVE   Ketones, ur NEGATIVE NEGATIVE mg/dL   Protein, ur NEGATIVE NEGATIVE mg/dL   Nitrite NEGATIVE NEGATIVE   Leukocytes,Ua NEGATIVE NEGATIVE   RBC / HPF 0-5 0 - 5 RBC/hpf  WBC, UA 0-5 0 - 5 WBC/hpf   Bacteria, UA NONE SEEN NONE SEEN   Squamous Epithelial / LPF 0-5 0 - 5    Mucus PRESENT    Hyaline Casts, UA PRESENT     Comment: Performed at [facility], [place] [address], [place], [place] [address]  SARS Coronavirus 2 [facility] order, Performed in [facility] hospital lab) Nasopharyngeal Nasopharyngeal Swab     Status: Abnormal   Collection Time: 10/09/18  6:41 AM   Specimen: Nasopharyngeal Swab  Result Value Ref Range   SARS Coronavirus 2 POSITIVE (A) NEGATIVE    Comment: RESULT CALLED TO, READ BACK BY AND VERIFIED WITH: S CLAPP,RN 10/09/18 1228 RHOLMES (NOTE) If result is NEGATIVE SARS-CoV-2 target nucleic acids are NOT DETECTED. The SARS-CoV-2 RNA is generally detectable in upper and lower  respiratory specimens during the acute phase of infection. The lowest  concentration of SARS-CoV-2 viral copies this assay can detect is 250  copies / mL. A negative result does not preclude SARS-CoV-2 infection  and should not be used as the sole basis for treatment or other  patient management decisions.  A negative result may occur with  improper specimen collection / handling, submission of specimen other  than nasopharyngeal swab, presence of viral mutation(s) within the  areas targeted by this assay, and inadequate number of viral copies  (<250 copies / mL). A negative result must be combined with clinical  observations, patient history, and epidemiological information. If result is POSITIVE SARS-CoV-2 target nucleic acids are DETECTED. The S ARS-CoV-2 RNA is generally detectable in upper and lower  respiratory specimens during the acute phase of infection.  Positive  results are indicative of active infection with SARS-CoV-2.  Clinical  correlation with patient history and other diagnostic information is  necessary to determine patient infection status.  Positive results do  not rule out bacterial infection or co-infection with other viruses. If result is PRESUMPTIVE POSTIVE SARS-CoV-2 nucleic acids MAY BE PRESENT.   A presumptive  positive result was obtained on the submitted specimen  and confirmed on repeat testing.  While 2019 novel coronavirus  (SARS-CoV-2) nucleic acids may be present in the submitted sample  additional confirmatory testing may be necessary for epidemiological  and / or clinical management purposes  to differentiate between  SARS-CoV-2 and other Sarbecovirus currently known to infect humans.  If clinically indicated additional testing with an alternate test  methodology [phone]) is adv ised. The SARS-CoV-2 RNA is generally  detectable in upper and lower respiratory specimens during the acute  phase of infection. The expected result is Negative. Fact Sheet for Patients:  StrictlyIdeas.no Fact Sheet for Healthcare Providers: BankingDealers.co.za This test is not yet approved or cleared by the [place] FDA and has been authorized for detection and/or diagnosis of SARS-CoV-2 by FDA under an Emergency Use Authorization (EUA).  This EUA will remain in effect (meaning this test can be used) for the duration of the COVID-19 declaration under Section 564(b)(1) of the Act, 21 U.S.C. section 360bbb-3(b)(1), unless the authorization is terminated or revoked sooner. Performed at [facility], [place] [address] [place], [place] [address]   CBG monitoring, ED     Status: Abnormal   Collection Time: 10/09/18  9:15 AM  Result Value Ref Range   Glucose-Capillary 124 (H) 70 - 99 mg/dL  CBG monitoring, ED     Status: Abnormal   Collection Time: 10/09/18 12:52 PM  Result Value Ref Range   Glucose-Capillary 144 (H)  70 - 99 mg/dL   Ct Head Wo Contrast  Result Date: 10/09/2018 CLINICAL DATA:  Patient with right-sided weakness. EXAM: CT HEAD WITHOUT CONTRAST TECHNIQUE: Contiguous axial images were obtained from the base of the skull through the vertex without intravenous contrast. COMPARISON:  Brain CT 09/17/2016 FINDINGS: Brain: Ventricles  and sulci are prominent compatible with atrophy. Motion artifact limits evaluation. No evidence for acute cortically based infarct, intracranial hemorrhage, mass lesion or mass-effect. Vascular: Unremarkable Skull: Intact Sinuses/Orbits: Paranasal sinuses are well aerated. Mastoid air cells are unremarkable. Other: None. IMPRESSION: No acute intracranial process. Atrophy and chronic microvascular ischemic changes. Electronically Signed   By: Lovey Newcomer M.D.   On: 10/09/2018 05:32   Dg Chest Port 1 View  Result Date: 10/09/2018 CLINICAL DATA:  Generalized weakness, dementia EXAM: PORTABLE CHEST 1 VIEW COMPARISON:  08/23/2017 FINDINGS: Lungs are clear.  No pleural effusion or pneumothorax. The heart is normal in size. IMPRESSION: No evidence of acute cardiopulmonary disease. Electronically Signed   By: Julian Hy M.D.   On: 10/09/2018 13:40    Pending Labs Unresulted Labs (From admission, onward)    Start     Ordered   10/10/18 0500  CBC  Tomorrow morning,   R     10/09/18 0803   10/10/18 [id number]  Basic metabolic panel  Tomorrow morning,   R     10/09/18 0803   10/09/18 1249  Ferritin  Add-on,   AD     10/09/18 1249   10/09/18 1249  Lactate dehydrogenase  Add-on,   AD     10/09/18 1249   10/09/18 1249  C-reactive protein  Add-on,   AD     10/09/18 1249   10/09/18 1249  D-dimer, quantitative (not at [facility])  Add-on,   AD     10/09/18 1249   10/09/18 1249  Fibrinogen  Add-on,   AD     10/09/18 1249          Vitals/Pain Today's Vitals   10/09/18 1330 10/09/18 1400 10/09/18 1430 10/09/18 1450  BP: (!) 126/96 129/64 (!) 141/92   Pulse: 70 72 78   Resp: 18 16 18    Temp:      TempSrc:      SpO2: 100% 99% 99%   Weight:      Height:      PainSc:    0-No pain    Isolation Precautions No active isolations  Medications Medications  insulin aspart (novoLOG) injection 0-9 Units (1 Units Subcutaneous Given 10/09/18 1259)  insulin aspart (novoLOG) injection 0-5 Units (has no  administration in time range)  heparin injection 5,000 Units (5,000 Units Subcutaneous Given 10/09/18 1300)  0.45 % sodium chloride infusion (has no administration in time range)  metoprolol succinate (TOPROL-XL) 24 hr tablet 12.5 mg (has no administration in time range)  ondansetron (ZOFRAN) injection 4 mg (has no administration in time range)  acetaminophen (TYLENOL) tablet 650 mg (has no administration in time range)  ascorbic acid (VITAMIN C) 500 MG/5ML syrup 1,000 mg (has no administration in time range)  cholecalciferol (VITAMIN D3) tablet 1,000 Units (has no administration in time range)  zinc sulfate capsule 220 mg (has no administration in time range)  levalbuterol (XOPENEX HFA) inhaler 2 puff (has no administration in time range)  methylPREDNISolone sodium succinate (SOLU-MEDROL) 40 mg/mL injection 40 mg (has no administration in time range)  LORazepam (ATIVAN) injection 1 mg (1 mg Intravenous Given 10/09/18 1020)    Mobility walks with device

## 2018-10-09 NOTE — H&P (Addendum)
History and Physical  Tony Vasquez [id number] DOB: 19-Oct-1932 DOA: 10/08/2018  Referring physician: Carry over from Dr. Renaee Munda PCP: Haywood Pao, MD  Outpatient Specialists: Nephrology Dr. Lyda Kalata Patient coming from: Home, lives with his son. His daughter Tony Vasquez is medical POA.  Chief Complaint: Generalized weakness, can't walk.  HPI: Tony Vasquez is a 85 year old male with past medical history significant for dementia, prostate cancer post radiation, CKD 3 follows with nephrology, hypertension, hyperlipidemia, type 2 diabetes, ambulatory dysfunction uses a cane at baseline who presented to [facility] ED from home due to generalized weakness with inability to ambulate.  Patient was ambulating with with a cane prior to this.  Patient is unable to provide a history due to advanced dementia.  History is mainly obtained from his daughter Tony Vasquez via phone and medical records.  Per his daughter he was in his usual state of health prior to this was ambulating with a cane up until 3 PM yesterday.  Afterwards, was no longer able to ambulate.  They were concerned for a stroke and called EMS.  No other associative symptoms.  Patient denies a headache, change in vision, difficulty swallowing, chest pain or palpitations.  On physical exam equal strength bilaterally in all 4 extremities.   ED Course: Vital signs notable for mildly elevated blood pressure.  Lab studies remarkable for worsening creatinine with concern for prerenal azotemia.  CT head unremarkable for any acute intracranial findings.  MRI ordered by EDP and pending.  UA unrevealing.  TRH asked to admit to rule out CVA.   UPDATE: COVID-19 screening POSITIVE.    Review of Systems: Review of systems as noted in the HPI. All other systems reviewed and are negative.   Past Medical History:  Diagnosis Date  . At risk for sleep apnea    STOP-BANG= 4    SENT TO PCP 11-16-2013  . Bilateral renal cysts   . Dyslipidemia   .  Hydronephrosis of left kidney   . Hypertension   . Mild renal insufficiency   . OA (osteoarthritis)   . Prostate cancer ([place])   . Radiation 01/16/14-03/16/14   Prostate 78 gray in 40 fractions  . Type 2 diabetes mellitus ([place])    Past Surgical History:  Procedure Laterality Date  . CATARACT EXTRACTION W/ INTRAOCULAR LENS IMPLANT Left   . CYSTOSCOPY W/ RETROGRADES Left 11/21/2013   Procedure: LEFT CYSTOSCOPY WITH RETROGRADE PYELOGRAM;  Surgeon: Malka So, MD;  Location: [facility];  Service: Urology;  Laterality: Left;  . INGUINAL HERNIA REPAIR Left 2000  . PROSTATE BIOPSY N/A 11/21/2013   Procedure: BIOPSY TRANSRECTAL ULTRASONIC PROSTATE (TUBP);  Surgeon: Malka So, MD;  Location[facility];  Service: Urology;  Laterality: N/A;  . TONSILECTOMY/ADENOIDECTOMY WITH MYRINGOTOMY    . TONSILLECTOMY      Social History:  reports that he has never smoked. He has never used smokeless tobacco. He reports that he does not drink alcohol or use drugs.   Allergies  Allergen Reactions  . Shrimp [Shellfish Allergy] Anaphylaxis  . 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 Was difficult to determine if it was do to Augmentin or both beta lactam agents.    Family History  Problem Relation Age of Onset  . Prostate cancer Father   . Diabetes Father   . Heart disease Father   . Hypertension Father   .  Hypertension Mother   . Crohn's disease Mother   . Heart disease Mother   . Crohn's disease Sister   . Heart disease Sister   . Hypertension Sister   . Diabetes Sister      Prior to Admission medications   Medication Sig Start Date End Date Taking? Authorizing Provider  furosemide (LASIX) 20 MG tablet Take 20-40 mg by mouth See admin instructions. 40mg  in the am and 20 mg in the afternoon   Yes [provider]  insulin glargine (LANTUS) 100 UNIT/ML injection Inject 0.15 mLs (15 Units total) into the skin daily. Patient taking differently: Inject 18 Units into the skin at bedtime.  08/25/17  Yes Reyne Dumas, MD  insulin lispro (HUMALOG KWIKPEN) 100 UNIT/ML KiwkPen Inject 0.03 mLs (3 Units total) into the skin 2 (two) times daily with breakfast and lunch. Patient taking differently: Inject 3-7 Units into the skin See admin instructions. 5 units am, 7 units lunch, and 3 units at dinner 08/25/17  Yes Abrol, Ascencion Dike, MD  metoprolol succinate (TOPROL-XL) 25 MG 24 hr tablet Take 12.5 mg by mouth daily.  08/11/17  Yes [provider]    Physical Exam: BP (!) 151/72   Pulse 67   Temp 99 F (37.2 C) (Oral)   Resp 14   Ht 5\' 8"  (1.727 m)   Wt 74 kg   SpO2 98%   BMI 24.81 kg/m   . General: 85 y.o. year-old male well developed well nourished in no acute distress.  Alert in the setting of dementia. . Cardiovascular: Regular rate and rhythm with no rubs or gallops.  No thyromegaly or JVD noted.  Trace lower extremity edema bilaterally. 2/4 pulses in all 4 extremities. Marland Kitchen Respiratory: Clear to auscultation with no wheezes or rales. Good inspiratory effort. . Abdomen: Soft nontender nondistended with normal bowel sounds x4 quadrants. . Muskuloskeletal: No cyanosis, clubbing.  Trace edema noted bilaterally in lower extremities. . Neuro: CN II-XII intact, equal strength bilaterally in all 4 extremities, sensation, reflexes intact. . Skin: No ulcerative lesions noted or rashes . Psychiatry: Judgement and insight appear altered in the setting of dementia. Mood is appropriate for condition and setting          Labs on Admission:  Basic Metabolic Panel: Recent Labs  Lab 10/09/18 0026  NA 141  K 4.1  CL 101  CO2 28  GLUCOSE 113*  BUN 32*  CREATININE 2.16*  CALCIUM 9.9   Liver Function Tests: Recent Labs  Lab 10/09/18 0026  AST 25  ALT 16  ALKPHOS 56  BILITOT 0.7  PROT 9.1*   ALBUMIN 4.7   No results for input(s): LIPASE, AMYLASE in the last 168 hours. No results for input(s): AMMONIA in the last 168 hours. CBC: Recent Labs  Lab 10/09/18 0026  WBC 6.4  NEUTROABS 4.3  HGB 15.9  HCT 48.2  MCV 93.1  PLT 162   Cardiac Enzymes: No results for input(s): CKTOTAL, CKMB, CKMBINDEX, TROPONINI in the last 168 hours.  BNP (last 3 results) No results for input(s): BNP in the last 8760 hours.  ProBNP (last 3 results) No results for input(s): PROBNP in the last 8760 hours.  CBG: Recent Labs  Lab 10/09/18 0007  GLUCAP 108*    Radiological Exams on Admission: Ct Head Wo Contrast  Result Date: 10/09/2018 CLINICAL DATA:  Patient with right-sided weakness. EXAM: CT HEAD WITHOUT CONTRAST TECHNIQUE: Contiguous axial images were obtained from the base of the skull through the vertex without intravenous  contrast. COMPARISON:  Brain CT 09/17/2016 FINDINGS: Brain: Ventricles and sulci are prominent compatible with atrophy. Motion artifact limits evaluation. No evidence for acute cortically based infarct, intracranial hemorrhage, mass lesion or mass-effect. Vascular: Unremarkable Skull: Intact Sinuses/Orbits: Paranasal sinuses are well aerated. Mastoid air cells are unremarkable. Other: None. IMPRESSION: No acute intracranial process. Atrophy and chronic microvascular ischemic changes. Electronically Signed   By: Lovey Newcomer M.D.   On: 10/09/2018 05:32    EKG: I independently viewed the EKG done and my findings are as followed: None available at the time of this visit.  Assessment/Plan Present on Admission: **None**  Active Problems:   Generalized weakness  Generalized weakness, unclear etiology, UPDATE: with positive COVID-19 screening could possibly be related to Covid-19 viral infection. CT head unremarkable for any acute intracranial findings MRI ordered by EDP and pending.  No apparent focal motor or sensory neurological deficits. Neurochecks every 4 hours  Obtain TSH UA unrevealing PT OT to assess Fall precautions Update: Obtain chest x-ray  Positive covid-19 screening test First positive covid-19 test on 10/09/18 No respiratory symptoms O2 saturation 100% on room air Tmax 99.2 Obtain chest x-ray Start IV Solu-Medrol 40 mg twice daily empirically x1 day and reassess Start vitamin C, D3, zinc supplements Start inhalers as needed for wheezing or dyspnea Maintain O2 saturation greater than 92% Transfer to [place] to continue care  AKI on CKD 3, likely prerenal secondary to dehydration in the setting of diuretics Creatinine at baseline is 1.7 with GFR of 40 Follows with nephrology outpatient, at last visit was prescribed p.o. Lasix due to bilateral lower extremity edema. Presented with creatinine of 2.1 and GFR of 32 Hold off Lasix for now Start gentle IV fluid hydration half-normal saline at 50 cc/h x 1 day. Avoid nephrotoxins Monitor urine output Repeat BMP in the morning  Hypertension Resume home Toprol-XL Continue to monitor vital signs  Type 2 diabetes with hyperglycemia Obtain hemoglobin A1c Start insulin sliding scale Avoid hypoglycemia      DVT prophylaxis: Subcu heparin 3 times daily  Code Status: Full code as stated by his daughter Tony Vasquez who is the medical POA.  Family Communication: Spoke with patient's daughter via phone.  All questions answered to her satisfaction.  Disposition Plan: Admit to telemetry unit  Consults called: None  Admission status: Inpatient status.  Transfer to [facility] due to positive COVID-19 test, to continue care.    Kayleen Memos MD [facility] Pager [phone]  If 7PM-7AM, please contact night-coverage [email or web address] Password [facility]  10/09/2018, 7:40 AM

## 2018-10-09 NOTE — ED Notes (Signed)
Patient is from home and could walk without assistance. Patient son is sitting at bedside.

## 2018-10-09 NOTE — ED Notes (Signed)
Condom cath placed on pt 

## 2018-10-09 NOTE — ED Notes (Signed)
Requested urine from patient. 

## 2018-10-09 NOTE — ED Notes (Signed)
Pt has ate,pt need to have a  soft diet due to pt has no teeth, need help feeding

## 2018-10-09 NOTE — ED Provider Notes (Signed)
[facility] DEPT Provider Note   CSN: [id number] Arrival date & time: 10/08/18  2346    History   Chief Complaint Chief Complaint  Patient presents with  . Weakness    HPI Tony Vasquez is a 85 y.o. male.   The history is provided by the EMS personnel and the patient. The history is limited by the condition of the patient (Confusion).  Weakness He has history of hypertension, hyperlipidemia, diabetes, renal insufficiency and comes in by ambulance because of weakness.  Patient is know why he is here, but denies pain, dyspnea, nausea.  He also denies weakness.  EMS reports that his blood glucose was reported to have been 180 at home and was 110 when they got there.  He has been unable to walk today but he normally is able to walk.  Past Medical History:  Diagnosis Date  . At risk for sleep apnea    STOP-BANG= 4    SENT TO PCP 11-16-2013  . Bilateral renal cysts   . Dyslipidemia   . Hydronephrosis of left kidney   . Hypertension   . Mild renal insufficiency   . OA (osteoarthritis)   . Prostate cancer ([place])   . Radiation 01/16/14-03/16/14   Prostate 78 gray in 40 fractions  . Type 2 diabetes mellitus [facility])     Patient Active Problem List   Diagnosis Date Noted  . Hypoglycemia 08/23/2017  . Bradycardia   . Malignant neoplasm of prostate ([place]) 01/16/2014  . Secondary renovascular hypertension, benign 12/12/2012  . Chronic kidney disease, unspecified 12/12/2012  . Type II or unspecified type diabetes mellitus with renal manifestations, not stated as uncontrolled(250.40) 12/12/2012  . Osteoarthrosis, unspecified whether generalized or localized, unspecified site 11/24/2012  . Acute bronchitis 11/24/2012  . Fever 10/31/2012  . Rash 10/31/2012  . Intractable hiccups 10/31/2012  . Renal insufficiency, mild 10/31/2012  . Thrombocytopenia ([place]) 10/31/2012  . Normocytic anemia 10/31/2012  . DM (diabetes mellitus) ([place]) 10/31/2012  . HTN  (hypertension) 10/31/2012  . Dyslipidemia 10/31/2012    Past Surgical History:  Procedure Laterality Date  . CATARACT EXTRACTION W/ INTRAOCULAR LENS IMPLANT Left   . CYSTOSCOPY W/ RETROGRADES Left 11/21/2013   Procedure: LEFT CYSTOSCOPY WITH RETROGRADE PYELOGRAM;  Surgeon: Malka So, MD;  Location: [facility];  Service: Urology;  Laterality: Left;  . INGUINAL HERNIA REPAIR Left 2000  . PROSTATE BIOPSY N/A 11/21/2013   Procedure: BIOPSY TRANSRECTAL ULTRASONIC PROSTATE (TUBP);  Surgeon: Malka So, MD;  Location: [facility];  Service: Urology;  Laterality: N/A;  . TONSILECTOMY/ADENOIDECTOMY WITH MYRINGOTOMY    . TONSILLECTOMY          Home Medications    Prior to Admission medications   Medication Sig Start Date End Date Taking? Authorizing Provider  Alcohol Swabs (ALCOHOL PREP) 70 % PADS  10/26/13   [provider]  Calcium Carbonate-Vitamin D (CALCIUM-D PO) Take 1 tablet by mouth daily.    [provider]  furosemide (LASIX) 20 MG tablet Take 40 mg by mouth daily.     [provider]  insulin glargine (LANTUS) 100 UNIT/ML injection Inject 0.15 mLs (15 Units total) into the skin daily. 08/25/17   Reyne Dumas, MD  insulin lispro (HUMALOG KWIKPEN) 100 UNIT/ML KiwkPen Inject 0.03 mLs (3 Units total) into the skin 2 (two) times daily with breakfast and lunch. 08/25/17   Reyne Dumas, MD  metoprolol succinate (TOPROL-XL) 25 MG 24 hr tablet Take 25 mg by  mouth daily. 08/11/17   [provider]    Family History Family History  Problem Relation Age of Onset  . Prostate cancer Father   . Diabetes Father   . Heart disease Father   . Hypertension Father   . Hypertension Mother   . Crohn's disease Mother   . Heart disease Mother   . Crohn's disease Sister   . Heart disease Sister   . Hypertension Sister   . Diabetes Sister     Social History Social History   Tobacco Use  . Smoking status: Never Smoker  .  Smokeless tobacco: Never Used  Substance Use Topics  . Alcohol use: No  . Drug use: No     Allergies   Shrimp [shellfish allergy] and Augmentin [amoxicillin-pot clavulanate]   Review of Systems Review of Systems  Unable to perform ROS: Mental status change  Neurological: Positive for weakness.     Physical Exam Updated Vital Signs BP (!) 153/81 (BP Location: Left Arm)   Pulse 87   Temp 99 F (37.2 C) (Oral)   Resp 20   SpO2 97%   Physical Exam Vitals signs and nursing note reviewed.    85 year old male, resting comfortably and in no acute distress. Vital signs are significant for elevated blood pressure. Oxygen saturation is 97%, which is normal. Head is normocephalic and atraumatic. PERRLA, EOMI. Oropharynx is clear. Neck is nontender and supple without adenopathy or JVD. Back is nontender and there is no CVA tenderness. Lungs are clear without rales, wheezes, or rhonchi. Chest is nontender. Heart has regular rate and rhythm without murmur. Abdomen is soft, flat, nontender without masses or hepatosplenomegaly and peristalsis is normoactive. Extremities have 1+ pretibial edema, full range of motion is present.  There are moderate venous stasis changes present bilaterally. Skin is warm and dry without rash. Neurologic: Awake and alert, oriented to person but thinks that he is at [facility], does not know the day or month or year, cranial nerves are intact, there are no motor or sensory deficits.  ED Treatments / Results  Labs (all labs ordered are listed, but only abnormal results are displayed) Labs Reviewed  URINALYSIS, ROUTINE W REFLEX MICROSCOPIC - Abnormal; Notable for the following components:      Result Value   Color, Urine STRAW (*)    Hgb urine dipstick SMALL (*)    All other components within normal limits  COMPREHENSIVE METABOLIC PANEL - Abnormal; Notable for the following components:   Glucose, Bld 113 (*)    BUN 32 (*)    Creatinine, Ser  2.16 (*)    Total Protein 9.1 (*)    GFR calc non Af Amer 27 (*)    GFR calc Af Amer 32 (*)    All other components within normal limits  CBG MONITORING, ED - Abnormal; Notable for the following components:   Glucose-Capillary 108 (*)    All other components within normal limits  SARS CORONAVIRUS 2  CBC WITH DIFFERENTIAL/PLATELET    Radiology Ct Head Wo Contrast  Result Date: 10/09/2018 CLINICAL DATA:  Patient with right-sided weakness. EXAM: CT HEAD WITHOUT CONTRAST TECHNIQUE: Contiguous axial images were obtained from the base of the skull through the vertex without intravenous contrast. COMPARISON:  Brain CT 09/17/2016 FINDINGS: Brain: Ventricles and sulci are prominent compatible with atrophy. Motion artifact limits evaluation. No evidence for acute cortically based infarct, intracranial hemorrhage, mass lesion or mass-effect. Vascular: Unremarkable Skull: Intact Sinuses/Orbits: Paranasal sinuses are well aerated. Mastoid  air cells are unremarkable. Other: None. IMPRESSION: No acute intracranial process. Atrophy and chronic microvascular ischemic changes. Electronically Signed   By: Lovey Newcomer M.D.   On: 10/09/2018 05:32    Procedures Procedures   Medications Ordered in ED Medications - No data to display   Initial Impression / Assessment and Plan / ED Course  I have reviewed the triage vital signs and the nursing notes.  Pertinent labs & imaging results that were available during my care of the patient were reviewed by me and considered in my medical decision making (see chart for details).  Confusion.  Per old records, this seems to be his baseline.  Weakness of uncertain cause.  Will check screening labs and urinalysis.  Although he is afebrile, will need to rule out occult infection.  Work-up is unremarkable.  He does have renal insufficiency which is not significantly changed from baseline.  Urinalysis shows no evidence of infection.  Attempt is made to ambulate him and  staff stated that it looked like he was not moving his left leg.  However, on repeat neurologic exam, he has normal strength of both of his legs, no focal neurologic deficit.  Family has arrived and he was noted to be walking normally at 3PM, and then to have difficulty walking this evening.  He will be sent for CT of head, will probably need to be admitted for further neurologic evaluation.  CT shows no acute process.  MRI has been ordered to rule out anterior circulation stroke that might affect coordination.  Case is discussed with Dr. Renaee Munda of [facility], who agrees to admit the patient.  Final Clinical Impressions(s) / ED Diagnoses   Final diagnoses:  Weakness  Renal insufficiency    ED Discharge Orders    None       Delora Fuel, MD [id number] [phone]

## 2018-10-09 NOTE — Progress Notes (Signed)
Called Patient's daughter, [name], who is also his medical POA.  Updated and informed her that patient will be moved to [facility] once a bed opens. She understands and agrees to plan.

## 2018-10-09 NOTE — Progress Notes (Signed)
Daughter, [name], updated by phone tonight by this RN.

## 2018-10-10 ENCOUNTER — Inpatient Hospital Stay (HOSPITAL_COMMUNITY): Payer: Medicare Other

## 2018-10-10 DIAGNOSIS — N183 Chronic kidney disease, stage 3 unspecified: Secondary | ICD-10-CM | POA: Diagnosis present

## 2018-10-10 DIAGNOSIS — U071 COVID-19: Principal | ICD-10-CM

## 2018-10-10 DIAGNOSIS — C61 Malignant neoplasm of prostate: Secondary | ICD-10-CM

## 2018-10-10 DIAGNOSIS — N289 Disorder of kidney and ureter, unspecified: Secondary | ICD-10-CM

## 2018-10-10 DIAGNOSIS — I10 Essential (primary) hypertension: Secondary | ICD-10-CM

## 2018-10-10 DIAGNOSIS — R9431 Abnormal electrocardiogram [ECG] [EKG]: Secondary | ICD-10-CM

## 2018-10-10 LAB — COMPREHENSIVE METABOLIC PANEL
ALT: 17 U/L (ref 0–44)
AST: 26 U/L (ref 15–41)
Albumin: 3.7 g/dL (ref 3.5–5.0)
Alkaline Phosphatase: 51 U/L (ref 38–126)
Anion gap: 16 — ABNORMAL HIGH (ref 5–15)
BUN: 33 mg/dL — ABNORMAL HIGH (ref 8–23)
CO2: 22 mmol/L (ref 22–32)
Calcium: 8.9 mg/dL (ref 8.9–10.3)
Chloride: 97 mmol/L — ABNORMAL LOW (ref 98–111)
Creatinine, Ser: 1.89 mg/dL — ABNORMAL HIGH (ref 0.61–1.24)
GFR calc Af Amer: 37 mL/min — ABNORMAL LOW (ref >60)
GFR calc non Af Amer: 32 mL/min — ABNORMAL LOW (ref >60)
Glucose, Bld: 290 mg/dL — ABNORMAL HIGH (ref 70–99)
Potassium: 4.2 mmol/L (ref 3.5–5.1)
Sodium: 135 mmol/L (ref 135–145)
Total Bilirubin: 0.6 mg/dL (ref 0.3–1.2)
Total Protein: 7.5 g/dL (ref 6.5–8.1)

## 2018-10-10 LAB — GLUCOSE, CAPILLARY
Glucose-Capillary: 158 mg/dL — ABNORMAL HIGH (ref 70–99)
Glucose-Capillary: 243 mg/dL — ABNORMAL HIGH (ref 70–99)
Glucose-Capillary: 252 mg/dL — ABNORMAL HIGH (ref 70–99)
Glucose-Capillary: 375 mg/dL — ABNORMAL HIGH (ref 70–99)
Glucose-Capillary: 390 mg/dL — ABNORMAL HIGH (ref 70–99)

## 2018-10-10 LAB — CBC
HCT: 45 % (ref 39.0–52.0)
Hemoglobin: 14.8 g/dL (ref 13.0–17.0)
MCH: 30.2 pg (ref 26.0–34.0)
MCHC: 32.9 g/dL (ref 30.0–36.0)
MCV: 91.8 fL (ref 80.0–100.0)
Platelets: 152 10*3/uL (ref 150–400)
RBC: 4.9 MIL/uL (ref 4.22–5.81)
RDW: 12.6 % (ref 11.5–15.5)
WBC: 3.9 10*3/uL — ABNORMAL LOW (ref 4.0–10.5)
nRBC: 0 % (ref 0.0–0.2)

## 2018-10-10 LAB — LIPID PANEL
Cholesterol: 152 mg/dL (ref 0–200)
HDL: 31 mg/dL — ABNORMAL LOW (ref >40)
LDL Cholesterol: 108 mg/dL — ABNORMAL HIGH (ref 0–99)
Total CHOL/HDL Ratio: 4.9 RATIO
Triglycerides: 65 mg/dL (ref <150)
VLDL: 13 mg/dL (ref 0–40)

## 2018-10-10 LAB — TROPONIN I (HIGH SENSITIVITY)
Troponin I (High Sensitivity): 12 ng/L (ref <18)
Troponin I (High Sensitivity): 15 ng/L (ref <18)

## 2018-10-10 LAB — BASIC METABOLIC PANEL
Anion gap: 13 (ref 5–15)
BUN: 33 mg/dL — ABNORMAL HIGH (ref 8–23)
CO2: 22 mmol/L (ref 22–32)
Calcium: 8.7 mg/dL — ABNORMAL LOW (ref 8.9–10.3)
Chloride: 99 mmol/L (ref 98–111)
Creatinine, Ser: 1.86 mg/dL — ABNORMAL HIGH (ref 0.61–1.24)
GFR calc Af Amer: 38 mL/min — ABNORMAL LOW (ref >60)
GFR calc non Af Amer: 33 mL/min — ABNORMAL LOW (ref >60)
Glucose, Bld: 293 mg/dL — ABNORMAL HIGH (ref 70–99)
Potassium: 4.1 mmol/L (ref 3.5–5.1)
Sodium: 134 mmol/L — ABNORMAL LOW (ref 135–145)

## 2018-10-10 LAB — DIFFERENTIAL
Basophils Absolute: 0 10*3/uL (ref 0.0–0.1)
Basophils Relative: 0 %
Eosinophils Absolute: 0 10*3/uL (ref 0.0–0.5)
Eosinophils Relative: 0 %
Lymphocytes Relative: 22 %
Lymphs Abs: 0.8 10*3/uL (ref 0.7–4.0)
Monocytes Absolute: 0.1 10*3/uL (ref 0.1–1.0)
Monocytes Relative: 2 %
Neutro Abs: 2.9 10*3/uL (ref 1.7–7.7)
Neutrophils Relative %: 77 %

## 2018-10-10 LAB — C-REACTIVE PROTEIN: CRP: 0.9 mg/dL (ref <1.0)

## 2018-10-10 LAB — ECHOCARDIOGRAM COMPLETE
Height: 68 in
Weight: 2751.34 oz

## 2018-10-10 LAB — D-DIMER, QUANTITATIVE: D-Dimer, Quant: 1.98 ug/mL-FEU — ABNORMAL HIGH (ref 0.00–0.50)

## 2018-10-10 LAB — MAGNESIUM: Magnesium: 1.7 mg/dL (ref 1.7–2.4)

## 2018-10-10 LAB — PHOSPHORUS: Phosphorus: 4.3 mg/dL (ref 2.5–4.6)

## 2018-10-10 MED ORDER — SODIUM CHLORIDE 0.9 % IV SOLN
INTRAVENOUS | Status: DC
  Administered 2018-10-10 – 2018-10-11 (×3): via INTRAVENOUS

## 2018-10-10 MED ORDER — ZOLPIDEM TARTRATE 5 MG PO TABS: 5.0000 mg | ORAL_TABLET | Freq: Every evening | ORAL | Status: DC | PRN

## 2018-10-10 MED ORDER — INSULIN GLARGINE 100 UNIT/ML ~~LOC~~ SOLN
9.0000 [IU] | Freq: Every day | SUBCUTANEOUS | Status: DC
  Administered 2018-10-10 – 2018-10-11 (×2): 9 [IU] via SUBCUTANEOUS
  Filled 2018-10-10 (×2): qty 0.09

## 2018-10-10 MED ORDER — INSULIN ASPART 100 UNIT/ML ~~LOC~~ SOLN
0.0000 [IU] | SUBCUTANEOUS | Status: DC
  Administered 2018-10-10 (×2): 20 [IU] via SUBCUTANEOUS
  Administered 2018-10-11: 7 [IU] via SUBCUTANEOUS
  Administered 2018-10-11 (×2): 20 [IU] via SUBCUTANEOUS
  Administered 2018-10-11: 11 [IU] via SUBCUTANEOUS
  Administered 2018-10-11: 7 [IU] via SUBCUTANEOUS
  Administered 2018-10-11: 20 [IU] via SUBCUTANEOUS
  Administered 2018-10-12: 11 [IU] via SUBCUTANEOUS
  Administered 2018-10-12: 16:00:00 20 [IU] via SUBCUTANEOUS
  Administered 2018-10-12: 7 [IU] via SUBCUTANEOUS
  Administered 2018-10-12: 20 [IU] via SUBCUTANEOUS
  Administered 2018-10-12: 15 [IU] via SUBCUTANEOUS
  Administered 2018-10-13: 11:00:00 20 [IU] via SUBCUTANEOUS
  Administered 2018-10-13 (×2): 4 [IU] via SUBCUTANEOUS
  Administered 2018-10-13 (×2): 15 [IU] via SUBCUTANEOUS
  Administered 2018-10-14: 01:00:00 11 [IU] via SUBCUTANEOUS
  Administered 2018-10-14: 3 [IU] via SUBCUTANEOUS
  Administered 2018-10-14: 7 [IU] via SUBCUTANEOUS

## 2018-10-10 MED ORDER — METHYLPREDNISOLONE SODIUM SUCC 40 MG IJ SOLR
40.0000 mg | Freq: Three times a day (TID) | INTRAMUSCULAR | Status: DC
  Administered 2018-10-10 – 2018-10-12 (×8): 40 mg via INTRAVENOUS
  Filled 2018-10-10 (×8): qty 1

## 2018-10-10 MED ORDER — AMLODIPINE BESYLATE 5 MG PO TABS
5.0000 mg | ORAL_TABLET | Freq: Every day | ORAL | Status: DC
  Administered 2018-10-10 – 2018-10-12 (×3): 5 mg via ORAL
  Filled 2018-10-10 (×3): qty 1

## 2018-10-10 NOTE — Progress Notes (Addendum)
PROGRESS NOTE    Tony Vasquez  [id number] DOB: 14-Apr-1932 DOA: 10/08/2018 PCP: Haywood Pao, MD   Brief Narrative:  85 year old BM PMHx Dementia, prostate cancer. S/P  XRT , CKD 3 follows with nephrology, HTN, HLD, diabetes type 2 uncontrolled with complication. Ambulatory dysfunction uses a cane at baseline   Presented to [facility] ED from home due to generalized weakness with inability to ambulate.  Patient was ambulating with with a cane prior to this.  Patient is unable to provide a history due to advanced dementia.  History is mainly obtained from [name] and medical records.  Per his daughter he was in his usual state of health prior to this was ambulating with a cane up until 3 PM yesterday.  Afterwards, was no longer able to ambulate.  They were concerned for a stroke and called EMS.  No other associative symptoms.  Patient denies a headache, change in vision, difficulty swallowing, chest pain or palpitations.  On physical exam equal strength bilaterally in all 4 extremities.   ED Course: Vital signs notable for mildly elevated blood pressure.  Lab studies remarkable for worsening creatinine with concern for prerenal azotemia.  CT head unremarkable for any acute intracranial findings.  MRI ordered by EDP and pending.  UA unrevealing.  TRH asked to admit to rule out CVA.   Subjective: A/O x1, very pleasant follows all commands.  States at home uses a cane to ambulate   Assessment & Plan:   Active Problems:   HTN (hypertension)   Generalized weakness   CKD (chronic kidney disease), stage III (HCC)   Benign essential HTN   Prostate cancer ([place])  COVID 19 infection - 8/23 PCXR: WNL Recent Labs  Lab 10/10/18 0448  CRP 0.9   Recent Labs  Lab 10/10/18 0500  DDIMER 1.98*  -Patient does not meet criteria for Remdesivir or Actrema treatment - Solu-Medrol 40 mg TID -Vitamins per "protocol - Titrate O2 to main SPO2> 89%  Essential  HTN/Bradycardia  - DC nodal blocking agents to include metoprolol - Amlodipine 5 mg daily - With episodes of bradycardia, (new onset?),  Notated in vitals -  Trend troponin - Obtain EKG - Obtain echocardiogram  Prolonged QT interval - Patient bradycardic at 54 bpm with QTC interval acute 538.  This increases patient's risk of torsades (QTC> 500). - Hold all QT prolonging medication  Generalized weakness  -Multifactorial, bradycardia, dehydration, acute on CKD stage III -Correct underlying cause - 8/24 PT/OT consult evaluate patient for weakness, need for SNF vs home health - MRI canceled no neurological deficit, WNL see results below -TSH WNL  Acute on CKD stage III (baseline Cr 1.79, obtained 08/25/2017) - Strict in and outs -Daily weight -Avoid nephrotoxins Recent Labs  Lab 10/09/18 0026 10/10/18 0448 10/10/18 0500  CREATININE 2.16* 1.86* 1.89*  -Most likely secondary to acute illness, dehydration from diuretics. - Hold diuretics. - Normal saline 52ml/hr  Diabetes type 2 uncontrolled with complication - 99991111 hemoglobin A1c= 8.7 -8/24 lipid panel pending -Resistant SSI    DVT prophylaxis: Subcu heparin Code Status: Full Family Communication: 8/24 spoke with [name] patient's daughter went over patient's plan of care answered all questions. Disposition Plan: TBD   Consultants:     Procedures/Significant Events:  8/23 PCXR: WNL. 8/23 CT head Wo contrast: Negative acute intracranial process. 8/23 PCXR: Negative for acute cardiopulmonary disease    I have personally reviewed and interpreted all radiology studies and my findings are as above.  VENTILATOR  SETTINGS:    Cultures   Antimicrobials:    Devices    LINES / TUBES:      Continuous Infusions:   Objective: Vitals:   10/10/18 0400 10/10/18 0500 10/10/18 0700 10/10/18 0711  BP: (!) 169/99  (!) 168/74 (!) 170/80  Pulse: (!) 59   (!) 55  Resp: 18  17 14   Temp: 98.2 F (36.8 C)   98  F (36.7 C)  TempSrc: Oral   Oral  SpO2: 99%   100%  Weight:  78 kg    Height:        Intake/Output Summary (Last 24 hours) at 10/10/2018 0813 Last data filed at 10/10/2018 0700 Gross per 24 hour  Intake 449.03 ml  Output 375 ml  Net 74.03 ml   Filed Weights   10/09/18 0135 10/10/18 0500  Weight: 74 kg 78 kg    Examination:  General: A/O x1 (does not know where, when, why) negative acute respiratory distress Eyes: negative scleral hemorrhage, negative anisocoria, negative icterus ENT: Negative Runny nose, negative gingival bleeding, Neck:  Negative scars, masses, torticollis, lymphadenopathy, JVD Lungs: Clear to auscultation bilaterally without wheezes or crackles Cardiovascular: Regular rate and rhythm without murmur gallop or rub normal S1 and S2 Abdomen: negative abdominal pain, nondistended, positive soft, bowel sounds, no rebound, no ascites, no appreciable mass Extremities: No significant cyanosis, clubbing, or edema bilateral lower extremities Skin: Negative rashes, lesions, ulcers Psychiatric:  Negative depression, negative anxiety, negative fatigue, negative mania  Central nervous system:  Cranial nerves II through XII intact, tongue/uvula midline, all extremities muscle strength 5/5, sensation intact throughout, finger nose finger bilateral within normal limits, quick finger touch bilateral within normal limits, negative dysarthria, negative expressive aphasia, negative receptive aphasia.  .     Data Reviewed: Care during the described time interval was provided by me .  I have reviewed this patient's available data, including medical history, events of note, physical examination, and all test results as part of my evaluation.   CBC: Recent Labs  Lab 10/09/18 0026 10/10/18 0448  WBC 6.4 3.9*  NEUTROABS 4.3  --   HGB 15.9 14.8  HCT 48.2 45.0  MCV 93.1 91.8  PLT 162 0000000   Basic Metabolic Panel: Recent Labs  Lab 10/09/18 0026 10/10/18 0448  NA 141 134*    K 4.1 4.1  CL 101 99  CO2 28 22  GLUCOSE 113* 293*  BUN 32* 33*  CREATININE 2.16* 1.86*  CALCIUM 9.9 8.7*   GFR: Estimated Creatinine Clearance: 29.1 mL/min (A) (by C-G formula based on SCr of 1.86 mg/dL (H)). Liver Function Tests: Recent Labs  Lab 10/09/18 0026  AST 25  ALT 16  ALKPHOS 56  BILITOT 0.7  PROT 9.1*  ALBUMIN 4.7   No results for input(s): LIPASE, AMYLASE in the last 168 hours. No results for input(s): AMMONIA in the last 168 hours. Coagulation Profile: No results for input(s): INR, PROTIME in the last 168 hours. Cardiac Enzymes: No results for input(s): CKTOTAL, CKMB, CKMBINDEX, TROPONINI in the last 168 hours. BNP (last 3 results) No results for input(s): PROBNP in the last 8760 hours. HbA1C: Recent Labs    10/09/18 0029  HGBA1C 8.7*   CBG: Recent Labs  Lab 10/09/18 0007 10/09/18 0915 10/09/18 1252 10/09/18 1709 10/10/18 0746  GLUCAP 108* 124* 144* 157* 243*   Lipid Profile: No results for input(s): CHOL, HDL, LDLCALC, TRIG, CHOLHDL, LDLDIRECT in the last 72 hours. Thyroid Function Tests: Recent Labs    10/09/18 0029  TSH 2.973   Anemia Panel: No results for input(s): VITAMINB12, FOLATE, FERRITIN, TIBC, IRON, RETICCTPCT in the last 72 hours. Urine analysis:    Component Value Date/Time   COLORURINE STRAW (A) 10/09/2018 0307   APPEARANCEUR CLEAR 10/09/2018 0307   LABSPEC 1.009 10/09/2018 0307   PHURINE 5.0 10/09/2018 0307   GLUCOSEU NEGATIVE 10/09/2018 0307   HGBUR SMALL (A) 10/09/2018 0307   BILIRUBINUR NEGATIVE 10/09/2018 0307   KETONESUR NEGATIVE 10/09/2018 0307   PROTEINUR NEGATIVE 10/09/2018 0307   UROBILINOGEN 0.2 10/31/2012 0109   NITRITE NEGATIVE 10/09/2018 0307   LEUKOCYTESUR NEGATIVE 10/09/2018 0307   Sepsis Labs: @LABRCNTIP (procalcitonin:4,lacticidven:4)  ) Recent Results (from the past 240 hour(s))  SARS Coronavirus 2 [facility] order, Performed in [facility] hospital lab) Nasopharyngeal Nasopharyngeal Swab      Status: Abnormal   Collection Time: 10/09/18  6:41 AM   Specimen: Nasopharyngeal Swab  Result Value Ref Range Status   SARS Coronavirus 2 POSITIVE (A) NEGATIVE Final    Comment: RESULT CALLED TO, READ BACK BY AND VERIFIED WITH: S CLAPP,RN 10/09/18 1228 RHOLMES (NOTE) If result is NEGATIVE SARS-CoV-2 target nucleic acids are NOT DETECTED. The SARS-CoV-2 RNA is generally detectable in upper and lower  respiratory specimens during the acute phase of infection. The lowest  concentration of SARS-CoV-2 viral copies this assay can detect is 250  copies / mL. A negative result does not preclude SARS-CoV-2 infection  and should not be used as the sole basis for treatment or other  patient management decisions.  A negative result may occur with  improper specimen collection / handling, submission of specimen other  than nasopharyngeal swab, presence of viral mutation(s) within the  areas targeted by this assay, and inadequate number of viral copies  (<250 copies / mL). A negative result must be combined with clinical  observations, patient history, and epidemiological information. If result is POSITIVE SARS-CoV-2 target nucleic acids are DETECTED. The S ARS-CoV-2 RNA is generally detectable in upper and lower  respiratory specimens during the acute phase of infection.  Positive  results are indicative of active infection with SARS-CoV-2.  Clinical  correlation with patient history and other diagnostic information is  necessary to determine patient infection status.  Positive results do  not rule out bacterial infection or co-infection with other viruses. If result is PRESUMPTIVE POSTIVE SARS-CoV-2 nucleic acids MAY BE PRESENT.   A presumptive positive result was obtained on the submitted specimen  and confirmed on repeat testing.  While 2019 novel coronavirus  (SARS-CoV-2) nucleic acids may be present in the submitted sample  additional confirmatory testing may be necessary for  epidemiological  and / or clinical management purposes  to differentiate between  SARS-CoV-2 and other Sarbecovirus currently known to infect humans.  If clinically indicated additional testing with an alternate test  methodology [id number]) is adv ised. The SARS-CoV-2 RNA is generally  detectable in upper and lower respiratory specimens during the acute  phase of infection. The expected result is Negative. Fact Sheet for Patients:  StrictlyIdeas.no Fact Sheet for Healthcare Providers: BankingDealers.co.za This test is not yet approved or cleared by the [place] FDA and has been authorized for detection and/or diagnosis of SARS-CoV-2 by FDA under an Emergency Use Authorization (EUA).  This EUA will remain in effect (meaning this test can be used) for the duration of the COVID-19 declaration under Section 564(b)(1) of the Act, 21 U.S.C. section 360bbb-3(b)(1), unless the authorization is terminated or revoked sooner. Performed at [facility], [place]  [address] [place], [place] [address]          Radiology Studies: Ct Head Wo Contrast  Result Date: 10/09/2018 CLINICAL DATA:  Patient with right-sided weakness. EXAM: CT HEAD WITHOUT CONTRAST TECHNIQUE: Contiguous axial images were obtained from the base of the skull through the vertex without intravenous contrast. COMPARISON:  Brain CT 09/17/2016 FINDINGS: Brain: Ventricles and sulci are prominent compatible with atrophy. Motion artifact limits evaluation. No evidence for acute cortically based infarct, intracranial hemorrhage, mass lesion or mass-effect. Vascular: Unremarkable Skull: Intact Sinuses/Orbits: Paranasal sinuses are well aerated. Mastoid air cells are unremarkable. Other: None. IMPRESSION: No acute intracranial process. Atrophy and chronic microvascular ischemic changes. Electronically Signed   By: Lovey Newcomer M.D.   On: 10/09/2018 05:32   Dg Chest Port 1  View  Result Date: 10/09/2018 CLINICAL DATA:  Generalized weakness, dementia EXAM: PORTABLE CHEST 1 VIEW COMPARISON:  08/23/2017 FINDINGS: Lungs are clear.  No pleural effusion or pneumothorax. The heart is normal in size. IMPRESSION: No evidence of acute cardiopulmonary disease. Electronically Signed   By: Julian Hy M.D.   On: 10/09/2018 13:40        Scheduled Meds:  cholecalciferol  1,000 Units Oral Daily   heparin injection (subcutaneous)  5,000 Units Subcutaneous Q8H   insulin aspart  0-5 Units Subcutaneous QHS   insulin aspart  0-9 Units Subcutaneous TID WC   metoprolol succinate  12.5 mg Oral Daily   vitamin C  1,000 mg Oral Daily   zinc sulfate  220 mg Oral Daily   Continuous Infusions:   LOS: 1 day   The patient is critically ill with multiple organ systems failure and requires high complexity decision making for assessment and support, frequent evaluation and titration of therapies, application of advanced monitoring technologies and extensive interpretation of multiple databases. Critical Care Time devoted to patient care services described in this note  Time spent: 40 minutes     Hadia Minier, Geraldo Docker, MD [facility] Pager [phone]  If 7PM-7AM, please contact night-coverage [email or web address] Password [facility] 10/10/2018, 8:13 AM

## 2018-10-10 NOTE — Progress Notes (Signed)
  Echocardiogram 2D Echocardiogram has been performed.  Tony Vasquez 10/10/2018, 3:34 PM

## 2018-10-10 NOTE — Progress Notes (Signed)
Patient's daughter called requesting something for father's hiccups.  Patient has had hiccups on and off today while awake.  Spoke with Dr. Nevada Crane, due to patient's prolonged QT interval medication for hiccups would be dangerous.  Order for [facility] for sleep and will give tylenol for discomfort r/t to hiccups.  Will continue to monitor.

## 2018-10-10 NOTE — Plan of Care (Signed)
Mr. Leilani Merl remains in the [facility] PCU and is resting comfortably in bed.  No acute events overnight.  See flowsheet for assessment details and MAR for medication administration. Will continue to monitor.     Problem: Education: Goal: Knowledge of General Education information will improve Description: Including pain rating scale, medication(s)/side effects and non-pharmacologic comfort measures Outcome: Progressing   Problem: Health Behavior/Discharge Planning: Goal: Ability to manage health-related needs will improve Outcome: Progressing   Problem: Clinical Measurements: Goal: Ability to maintain clinical measurements within normal limits will improve Outcome: Progressing Goal: Will remain free from infection Outcome: Progressing Goal: Diagnostic test results will improve Outcome: Progressing Goal: Respiratory complications will improve Outcome: Progressing Goal: Cardiovascular complication will be avoided Outcome: Progressing   Problem: Activity: Goal: Risk for activity intolerance will decrease Outcome: Progressing   Problem: Nutrition: Goal: Adequate nutrition will be maintained Outcome: Progressing   Problem: Coping: Goal: Level of anxiety will decrease Outcome: Progressing   Problem: Elimination: Goal: Will not experience complications related to bowel motility Outcome: Progressing Goal: Will not experience complications related to urinary retention Outcome: Progressing   Problem: Pain Managment: Goal: General experience of comfort will improve Outcome: Progressing   Problem: Safety: Goal: Ability to remain free from injury will improve Outcome: Progressing   Problem: Skin Integrity: Goal: Risk for impaired skin integrity will decrease Outcome: Progressing

## 2018-10-10 NOTE — Progress Notes (Addendum)
Occupational Therapy Evaluation Patient Details Name: Tony Vasquez MRN: [id number] DOB: October 26, 1932 Today's Date: 10/10/2018    History of Present Illness 85 year old male with past medical history significant for dementia, prostate cancer post radiation, CKD 3 follows with nephrology, hypertension, hyperlipidemia, type 2 diabetes, ambulatory dysfunction uses a cane at baseline who presented to [facility] ED from home due to generalized weakness with inability to ambulate. CT - for CVA. Covid +.   Clinical Impression   Spoke with daughter [name] over the phone who confirmed that the pt lived with his son Nonda Lou) and was independent with self care and mobility using a straight cane. Family can provide 24/7 S after DC. Pt currently requires minA at times for mobility and minguard A for ADL tasks. Recommend HHOT to facilitate return to PLOF. Daughter is in agreement for [facility] servcies. Daughter asking questions regarding getting tested for Covid and managing her Dad after DC. Will continue to follow acutely.     Follow Up Recommendations  Home health OT;Supervision/Assistance - 24 hour    Equipment Recommendations  None recommended by OT    Recommendations for Other Services       Precautions / Restrictions Precautions Precautions: Fall      Mobility Bed Mobility Overal bed mobility: Needs Assistance Bed Mobility: Supine to Sit     Supine to sit: Supervision     General bed mobility comments: Posterior lean initially; able to self correct  Transfers Overall transfer level: Needs assistance Equipment used: Straight cane Transfers: Sit to/from Stand Sit to Stand: Min guard              Balance Overall balance assessment: Needs assistance   Sitting balance-Leahy Scale: Good       Standing balance-Leahy Scale: Fair Standing balance comment: LOB posteriorly standing at sink; able to independently regain balance by grabbing onto sink                            ADL either performed or assessed with clinical judgement   ADL Overall ADL's : Needs assistance/impaired Eating/Feeding: Modified independent   Grooming: Set up;Standing   Upper Body Bathing: Set up;Standing   Lower Body Bathing: Min guard;Sit to/from stand   Upper Body Dressing : Set up;Sitting   Lower Body Dressing: Min guard;Sit to/from stand   Toilet Transfer: Minimal assistance;Ambulation(with cane)   Toileting- Clothing Manipulation and Hygiene: Minimal assistance Toileting - Clothing Manipulation Details (indicate cue type and reason): condom cath     Functional mobility during ADLs: Min guard;Cueing for safety(cane)       Vision Baseline Vision/History: No visual deficits Additional Comments: s/p cataract surgery     Perception     Praxis      Pertinent Vitals/Pain Pain Assessment: Faces Faces Pain Scale: No hurt     Hand Dominance Right   Extremity/Trunk Assessment Upper Extremity Assessment Upper Extremity Assessment: Generalized weakness   Lower Extremity Assessment Lower Extremity Assessment: Defer to PT evaluation   Cervical / Trunk Assessment Cervical / Trunk Assessment: Kyphotic   Communication Communication Communication: No difficulties   Cognition Arousal/Alertness: Awake/alert Behavior During Therapy: WFL for tasks assessed/performed Overall Cognitive Status: Impaired/Different from baseline Area of Impairment: Orientation;Attention;Memory;Safety/judgement;Awareness                 Orientation Level: Disoriented to;Time;Situation Current Attention Level: Sustained Memory: Decreased recall of precautions;Decreased short-term memory   Safety/Judgement: Decreased awareness of safety Awareness: Emergent   General Comments:  most likely close to baseline; daughter states he is forgetful and does not know the date; able to recognize his familiy and carry on conversations   General Comments  Likes music and to be outside     Exercises     Shoulder Instructions      Home Living Family/patient expects to be discharged to:: Private residence Living Arrangements: Children Available Help at Discharge: Family;Available 24 hours/day Type of Home: House Home Access: Level entry     Home Layout: One level     Bathroom Shower/Tub: Teacher, early years/pre: Standard Bathroom Accessibility: Yes How Accessible: Accessible via walker Home Equipment: Shower seat;Cane - single point;Walker - 2 wheels          Prior Functioning/Environment Level of Independence: Independent with assistive device(s);Needs assistance  Gait / Transfers Assistance Needed: uses cane independently ADL's / Homemaking Assistance Needed: Son supervises bath; pt gets down into tub            OT Problem List: Decreased strength;Decreased activity tolerance;Impaired balance (sitting and/or standing);Decreased cognition;Decreased safety awareness      OT Treatment/Interventions: Self-care/ADL training;Therapeutic exercise;Neuromuscular education;DME and/or AE instruction;Therapeutic activities;Cognitive remediation/compensation;Patient/family education;Balance training    OT Goals(Current goals can be found in the care plan section) Acute Rehab OT Goals Patient Stated Goal: Per family  - to return home OT Goal Formulation: With patient/family Time For Goal Achievement: 10/24/18 Potential to Achieve Goals: Good  OT Frequency: Min 2X/week   Barriers to D/C:            Co-evaluation PT/OT/SLP Co-Evaluation/Treatment: Yes Reason for Co-Treatment: Necessary to address cognition/behavior during functional activity   OT goals addressed during session: ADL's and self-care      AM-PAC OT "6 Clicks" Daily Activity     Outcome Measure Help from another person eating meals?: None Help from another person taking care of personal grooming?: A Little Help from another person toileting, which includes using toliet, bedpan, or  urinal?: A Little Help from another person bathing (including washing, rinsing, drying)?: A Little Help from another person to put on and taking off regular upper body clothing?: A Little Help from another person to put on and taking off regular lower body clothing?: A Little 6 Click Score: 19   End of Session Equipment Utilized During Treatment: Other (comment)(straight cane) Nurse Communication: Mobility status;Other (comment)(use of seat belt alarm)  Activity Tolerance: Patient tolerated treatment well Patient left: in chair;with call bell/phone within reach;with chair alarm set  OT Visit Diagnosis: Unsteadiness on feet (R26.81);Muscle weakness (generalized) (M62.81);Other symptoms and signs involving cognitive function                Time: 1100-1138 OT Time Calculation (min): 38 min Charges:  OT General Charges $OT Visit: 1 Visit OT Evaluation $OT Eval Moderate Complexity: 1 Mod OT Treatments $Self Care/Home Management : 8-22 mins  Maurie Boettcher, OT/L   Acute OT Clinical Specialist Acute Rehabilitation Services Pager [phone] Office [phone]   [facility] 10/10/2018, 12:04 PM

## 2018-10-10 NOTE — Progress Notes (Signed)
Inpatient Diabetes Program Recommendations  AACE/ADA: New Consensus Statement on Inpatient Glycemic Control (2015)  Target Ranges:  Prepandial:   less than 140 mg/dL      Peak postprandial:   less than 180 mg/dL (1-2 hours)      Critically ill patients:  140 - 180 mg/dL   Lab Results  Component Value Date   GLUCAP 243 (H) 10/10/2018   HGBA1C 8.7 (H) 10/09/2018    Review of Glycemic Control Results for Tony Vasquez, Tony Vasquez (MRN [id number]) as of 10/10/2018 14:56  Ref. Range 10/09/2018 09:15 10/09/2018 12:52 10/09/2018 17:09 10/09/2018 21:41 10/10/2018 07:46  Glucose-Capillary Latest Ref Range: 70 - 99 mg/dL 124 (H) 144 (H) 157 (H) 158 (H) 243 (H)   Diabetes history: DM2 Outpatient Diabetes medications: Lantus 18 units qd + Humalog 3 units tid meal coverage if eats 50% Current orders for Inpatient glycemic control: Novolog resistant correction q 4 hrs.  Inpatient Diabetes Program Recommendations:   Since patient is eating, consider: -Lantus 9 units daily -Novolog 2 units tid meal coverage if eats 50% -Decrease Novolog correction to moderate tid + hs 0-5 units  Thank you, Bethena Roys E. Jacquel Mccamish, RN, MSN, CDE  Diabetes Coordinator Inpatient Glycemic Control Team Team Pager [phone] (8am-5pm) 10/10/2018 3:04 PM

## 2018-10-10 NOTE — Evaluation (Signed)
PT Cancellation Note  Patient Details Name: Tony Vasquez MRN: [id number] DOB: 1932-06-29   Cancelled Treatment:    Reason Eval/Treat Not Completed: Patient at procedure or test/unavailable  RN at bedside trying to start an IV. Will return today for PT eval    Barry Brunner, PT     Rexanne Mano 10/10/2018, 10:31 AM

## 2018-10-10 NOTE — Progress Notes (Signed)
Telephone call to [place], patient's daughter, updated on plan of care for shift and answered all questions.

## 2018-10-10 NOTE — Evaluation (Signed)
Physical Therapy Evaluation Patient Details Name: Tony Vasquez MRN: [id number] DOB: 01-Apr-1932 Today's Date: 10/10/2018   History of Present Illness  85 year old male with past medical history significant for dementia, prostate cancer post radiation, CKD 3 follows with nephrology, hypertension, hyperlipidemia, type 2 diabetes, ambulatory dysfunction uses a cane at baseline who presented to [facility] ED from home due to generalized weakness with inability to ambulate. CT - for CVA. Covid +.  Clinical Impression   Pt admitted with above diagnosis. Patient has improved significantly since admission and able to walk with minguard assist and straight cane. He had a loss of balance at the sink where he caught himself by grabbing the sink. Anticipate he can return home with family 24/7 assist as PTA. Pt currently with functional limitations due to the deficits listed below (see PT Problem List). Pt will benefit from skilled PT to increase their independence and safety with mobility to allow discharge to the venue listed below.       Follow Up Recommendations Home health PT;Supervision/Assistance - 24 hour    Equipment Recommendations  None recommended by PT    Recommendations for Other Services       Precautions / Restrictions Precautions Precautions: Fall      Mobility  Bed Mobility Overal bed mobility: Needs Assistance Bed Mobility: Supine to Sit     Supine to sit: Supervision     General bed mobility comments: Posterior lean initially; able to self correct  Transfers Overall transfer level: Needs assistance Equipment used: Straight cane Transfers: Sit to/from Stand Sit to Stand: Min guard            Ambulation/Gait Ambulation/Gait assistance: Min guard Gait Distance (Feet): 45 Feet Assistive device: Straight cane Gait Pattern/deviations: Step-through pattern;Decreased stride length;Trunk flexed     General Gait Details: watches his feet, but can stand more erect with  cues; denies numbness in feet; no imbalance, however moves slowly   Stairs            Wheelchair Mobility    Modified Rankin (Stroke Patients Only)       Balance Overall balance assessment: Needs assistance Sitting-balance support: No upper extremity supported;Feet supported Sitting balance-Leahy Scale: Good       Standing balance-Leahy Scale: Fair Standing balance comment: LOB posteriorly standing at sink; able to independently regain balance by grabbing onto sink                             Pertinent Vitals/Pain Pain Assessment: Faces Faces Pain Scale: No hurt    Home Living Family/patient expects to be discharged to:: Private residence Living Arrangements: Children Available Help at Discharge: Family;Available 24 hours/day Type of Home: House Home Access: Level entry     Home Layout: One level Home Equipment: Shower seat;Cane - single point;Walker - 2 wheels      Prior Function Level of Independence: Independent with assistive device(s);Needs assistance   Gait / Transfers Assistance Needed: uses cane independently  ADL's / Homemaking Assistance Needed: Son supervises bath; pt gets down into tub        Hand Dominance   Dominant Hand: Right    Extremity/Trunk Assessment   Upper Extremity Assessment Upper Extremity Assessment: Defer to OT evaluation    Lower Extremity Assessment Lower Extremity Assessment: Generalized weakness    Cervical / Trunk Assessment Cervical / Trunk Assessment: Kyphotic  Communication   Communication: No difficulties  Cognition Arousal/Alertness: Awake/alert Behavior During Therapy: [facility] for  tasks assessed/performed Overall Cognitive Status: Impaired/Different from baseline Area of Impairment: Orientation;Attention;Memory;Safety/judgement;Awareness                 Orientation Level: Disoriented to;Time;Situation Current Attention Level: Sustained Memory: Decreased recall of precautions;Decreased  short-term memory   Safety/Judgement: Decreased awareness of safety Awareness: Emergent   General Comments: most likely close to baseline; daughter states he is forgetful and does not know the date; able to recognize his familiy and carry on conversations      General Comments General comments (skin integrity, edema, etc.): Likes music and to be outside    Exercises     Assessment/Plan    PT Assessment Patient needs continued PT services  PT Problem List Decreased strength;Decreased balance;Decreased mobility;Decreased cognition;Decreased knowledge of use of DME       PT Treatment Interventions DME instruction;Gait training;Functional mobility training;Therapeutic activities;Therapeutic exercise;Balance training;Cognitive remediation;Patient/family education    PT Goals (Current goals can be found in the Care Plan section)  Acute Rehab PT Goals Patient Stated Goal: Per family  - to return home PT Goal Formulation: Patient unable to participate in goal setting Time For Goal Achievement: 10/24/18 Potential to Achieve Goals: Good    Frequency Min 3X/week   Barriers to discharge        Co-evaluation PT/OT/SLP Co-Evaluation/Treatment: Yes Reason for Co-Treatment: Necessary to address cognition/behavior during functional activity PT goals addressed during session: Mobility/safety with mobility;Balance;Proper use of DME OT goals addressed during session: ADL's and self-care       AM-PAC PT "6 Clicks" Mobility  Outcome Measure Help needed turning from your back to your side while in a flat bed without using bedrails?: None Help needed moving from lying on your back to sitting on the side of a flat bed without using bedrails?: None Help needed moving to and from a bed to a chair (including a wheelchair)?: A Little Help needed standing up from a chair using your arms (e.g., wheelchair or bedside chair)?: A Little Help needed to walk in hospital room?: A Little Help needed  climbing 3-5 steps with a railing? : A Little 6 Click Score: 20    End of Session   Activity Tolerance: Patient tolerated treatment well Patient left: in chair;with call bell/phone within reach;with chair alarm set Nurse Communication: Mobility status;Other (comment)(has lap belt alarm system) PT Visit Diagnosis: Unsteadiness on feet (R26.81);Difficulty in walking, not elsewhere classified (R26.2)    Time: [id number] PT Time Calculation (min) (ACUTE ONLY): 38 min   Charges:   PT Evaluation $PT Eval Moderate Complexity: 1 Mod            [employer], PT      [place] P Emrick Hensch 10/10/2018, 2:10 PM

## 2018-10-11 DIAGNOSIS — R9431 Abnormal electrocardiogram [ECG] [EKG]: Secondary | ICD-10-CM | POA: Diagnosis present

## 2018-10-11 DIAGNOSIS — E1165 Type 2 diabetes mellitus with hyperglycemia: Secondary | ICD-10-CM

## 2018-10-11 DIAGNOSIS — E118 Type 2 diabetes mellitus with unspecified complications: Secondary | ICD-10-CM

## 2018-10-11 DIAGNOSIS — R001 Bradycardia, unspecified: Secondary | ICD-10-CM | POA: Diagnosis present

## 2018-10-11 DIAGNOSIS — IMO0002 Reserved for concepts with insufficient information to code with codable children: Secondary | ICD-10-CM | POA: Diagnosis present

## 2018-10-11 DIAGNOSIS — N183 Chronic kidney disease, stage 3 (moderate): Secondary | ICD-10-CM

## 2018-10-11 LAB — COMPREHENSIVE METABOLIC PANEL
ALT: 18 U/L (ref 0–44)
AST: 26 U/L (ref 15–41)
Albumin: 3.3 g/dL — ABNORMAL LOW (ref 3.5–5.0)
Alkaline Phosphatase: 49 U/L (ref 38–126)
Anion gap: 14 (ref 5–15)
BUN: 44 mg/dL — ABNORMAL HIGH (ref 8–23)
CO2: 21 mmol/L — ABNORMAL LOW (ref 22–32)
Calcium: 8.3 mg/dL — ABNORMAL LOW (ref 8.9–10.3)
Chloride: 99 mmol/L (ref 98–111)
Creatinine, Ser: 1.75 mg/dL — ABNORMAL HIGH (ref 0.61–1.24)
GFR calc Af Amer: 41 mL/min — ABNORMAL LOW (ref >60)
GFR calc non Af Amer: 35 mL/min — ABNORMAL LOW (ref >60)
Glucose, Bld: 279 mg/dL — ABNORMAL HIGH (ref 70–99)
Potassium: 4 mmol/L (ref 3.5–5.1)
Sodium: 134 mmol/L — ABNORMAL LOW (ref 135–145)
Total Bilirubin: 0.4 mg/dL (ref 0.3–1.2)
Total Protein: 6.9 g/dL (ref 6.5–8.1)

## 2018-10-11 LAB — CBC WITH DIFFERENTIAL/PLATELET
Abs Immature Granulocytes: 0.04 10*3/uL (ref 0.00–0.07)
Basophils Absolute: 0 10*3/uL (ref 0.0–0.1)
Basophils Relative: 0 %
Eosinophils Absolute: 0 10*3/uL (ref 0.0–0.5)
Eosinophils Relative: 0 %
HCT: 43.6 % (ref 39.0–52.0)
Hemoglobin: 14.5 g/dL (ref 13.0–17.0)
Immature Granulocytes: 0 %
Lymphocytes Relative: 13 %
Lymphs Abs: 1.2 10*3/uL (ref 0.7–4.0)
MCH: 30.5 pg (ref 26.0–34.0)
MCHC: 33.3 g/dL (ref 30.0–36.0)
MCV: 91.6 fL (ref 80.0–100.0)
Monocytes Absolute: 0.3 10*3/uL (ref 0.1–1.0)
Monocytes Relative: 4 %
Neutro Abs: 7.6 10*3/uL (ref 1.7–7.7)
Neutrophils Relative %: 83 %
Platelets: 162 10*3/uL (ref 150–400)
RBC: 4.76 MIL/uL (ref 4.22–5.81)
RDW: 12.5 % (ref 11.5–15.5)
WBC: 9.2 10*3/uL (ref 4.0–10.5)
nRBC: 0 % (ref 0.0–0.2)

## 2018-10-11 LAB — D-DIMER, QUANTITATIVE: D-Dimer, Quant: 1.53 ug/mL-FEU — ABNORMAL HIGH (ref 0.00–0.50)

## 2018-10-11 LAB — GLUCOSE, CAPILLARY
Glucose-Capillary: 366 mg/dL — ABNORMAL HIGH (ref 70–99)
Glucose-Capillary: 281 mg/dL — ABNORMAL HIGH (ref 70–99)
Glucose-Capillary: 244 mg/dL — ABNORMAL HIGH (ref 70–99)
Glucose-Capillary: 233 mg/dL — ABNORMAL HIGH (ref 70–99)
Glucose-Capillary: 400 mg/dL — ABNORMAL HIGH (ref 70–99)
Glucose-Capillary: 358 mg/dL — ABNORMAL HIGH (ref 70–99)

## 2018-10-11 LAB — MAGNESIUM: Magnesium: 1.8 mg/dL (ref 1.7–2.4)

## 2018-10-11 LAB — LIPID PANEL
Cholesterol: 149 mg/dL (ref 0–200)
HDL: 27 mg/dL — ABNORMAL LOW (ref >40)
LDL Cholesterol: 106 mg/dL — ABNORMAL HIGH (ref 0–99)
Total CHOL/HDL Ratio: 5.5 RATIO
Triglycerides: 81 mg/dL (ref <150)
VLDL: 16 mg/dL (ref 0–40)

## 2018-10-11 LAB — C-REACTIVE PROTEIN: CRP: <0.8 mg/dL (ref <1.0)

## 2018-10-11 LAB — PHOSPHORUS: Phosphorus: 1.7 mg/dL — ABNORMAL LOW (ref 2.5–4.6)

## 2018-10-11 LAB — FERRITIN: Ferritin: 366 ng/mL — ABNORMAL HIGH (ref 24–336)

## 2018-10-11 MED ORDER — INSULIN ASPART 100 UNIT/ML ~~LOC~~ SOLN
7.0000 [IU] | Freq: Every day | SUBCUTANEOUS | Status: DC
  Administered 2018-10-11: 7 [IU] via SUBCUTANEOUS

## 2018-10-11 MED ORDER — ATORVASTATIN CALCIUM 40 MG PO TABS
40.0000 mg | ORAL_TABLET | Freq: Every day | ORAL | Status: DC
  Administered 2018-10-11 – 2018-10-13 (×3): 40 mg via ORAL
  Filled 2018-10-11 (×3): qty 1

## 2018-10-11 MED ORDER — INSULIN ASPART 100 UNIT/ML ~~LOC~~ SOLN: 5.0000 [IU] | Freq: Every day | SUBCUTANEOUS | Status: DC

## 2018-10-11 MED ORDER — INSULIN ASPART 100 UNIT/ML ~~LOC~~ SOLN
3.0000 [IU] | Freq: Every day | SUBCUTANEOUS | Status: DC
  Administered 2018-10-11: 18:00:00 3 [IU] via SUBCUTANEOUS

## 2018-10-11 MED ORDER — INSULIN GLARGINE 100 UNIT/ML ~~LOC~~ SOLN
18.0000 [IU] | Freq: Every day | SUBCUTANEOUS | Status: DC
  Administered 2018-10-11: 18 [IU] via SUBCUTANEOUS
  Filled 2018-10-11: qty 0.18

## 2018-10-11 NOTE — TOC Initial Note (Signed)
Transition of Care [facility]) - Initial/Assessment Note    Patient Details  Name: Tony Vasquez MRN: [id number] Date of Birth: 1933/01/10  Transition of Care [facility]) CM/SW Contact:    Ninfa Meeker, RN Phone Number: [phone] (working remotely) 10/11/2018, 10:02 AM  Clinical Narrative:   85 yr old gentleman admitted from home with weakness and COVID 19. Patient and son live together. He has advanced dementia. Case manager contacted patient's daughter , [name] [phone], to discuss discharge needs. Choice for [place] was offered, referral was called to Adela Lank, [facility] Liaison.                Expected Discharge Plan: [place] Barriers to Discharge: No Barriers Identified   Patient Goals and CMS Choice   CMS Medicare.gov Compare Post Acute Care list provided to:: (daughter: [name]) Choice offered to / list presented to : Adult Children  Expected Discharge Plan and Services Expected Discharge Plan: [place]   Discharge Planning Services: CM Consult                     DME Arranged: N/A         HH Arranged: PT, OT HH Agency: [place] Date [facility] Agency Contacted: 10/11/18 Time HH Agency Contacted: 1001 Representative spoke with at [place]: Adela Lank  Prior Living Arrangements/Services   Lives with:: Adult Children Patient language and need for interpreter reviewed:: No Do you feel safe going back to the place where you live?: Yes      Need for Family Participation in Patient Care: Yes (Comment) Care giver support system in place?: Yes (comment)   Criminal Activity/Legal Involvement Pertinent to Current Situation/Hospitalization: No - Comment as needed  Activities of Daily Living Home Assistive Devices/Equipment: Cane (specify quad or straight), CBG Meter ADL Screening (condition at time of admission) Patient's cognitive ability adequate to safely complete daily  activities?: Yes Is the patient deaf or have difficulty hearing?: No Does the patient have difficulty seeing, even when wearing glasses/contacts?: No Does the patient have difficulty concentrating, remembering, or making decisions?: Yes Patient able to express need for assistance with ADLs?: Yes Does the patient have difficulty dressing or bathing?: No Independently performs ADLs?: Yes (appropriate for developmental age)(with device uses a cane) Does the patient have difficulty walking or climbing stairs?: No Weakness of Legs: Both Weakness of Arms/Hands: None  Permission Sought/Granted Permission sought to share information with : Case Manager Permission granted to share information with : Yes, Verbal Permission Granted  Share Information with NAME: Adela Lank  Permission granted to share info w AGENCY: [facility]        Emotional Assessment       Orientation: : Oriented to Self, Oriented to Place, Oriented to  Time, Oriented to Situation Alcohol / Substance Use: Not Applicable    Admission diagnosis:  Weakness [R53.1] Renal insufficiency [N28.9] COVID-19 virus detected [U07.1] Generalized weakness [R53.1] Patient Active Problem List   Diagnosis Date Noted  . CKD (chronic kidney disease), stage III ([place]) 10/10/2018  . Benign essential HTN 10/10/2018  . Prostate cancer ([place]) 10/10/2018  . Generalized weakness 10/09/2018  . Hypoglycemia 08/23/2017  . Bradycardia   . Malignant neoplasm of prostate ([place]) 01/16/2014  . Secondary renovascular hypertension, benign 12/12/2012  . Chronic kidney disease, unspecified 12/12/2012  . Type II or unspecified type diabetes mellitus with renal manifestations, not stated as uncontrolled(250.40) 12/12/2012  .  Osteoarthrosis, unspecified whether generalized or localized, unspecified site 11/24/2012  . Acute bronchitis 11/24/2012  . Fever 10/31/2012  . Rash 10/31/2012  . Intractable hiccups 10/31/2012  . Renal insufficiency, mild  10/31/2012  . Thrombocytopenia ([place]) 10/31/2012  . Normocytic anemia 10/31/2012  . DM (diabetes mellitus) ([place]) 10/31/2012  . HTN (hypertension) 10/31/2012  . Dyslipidemia 10/31/2012   PCP:  Haywood Pao, MD Pharmacy:   [facility] [place], [place] [place] [address] Phone: [phone] Fax: [phone]     Social Determinants of Health (SDOH) Interventions    Readmission Risk Interventions No flowsheet data found.

## 2018-10-11 NOTE — Progress Notes (Signed)
PROGRESS NOTE    Tony Vasquez  [id number] DOB: [birth year] DOA: 10/08/2018 PCP: Haywood Pao, MD   Brief Narrative:  85 year old BM PMHx Dementia, prostate cancer. S/P  XRT , CKD 3 follows with nephrology, HTN, HLD, diabetes type 2 uncontrolled with complication. Ambulatory dysfunction uses a cane at baseline   Presented to [facility] ED from home due to generalized weakness with inability to ambulate.  Patient was ambulating with with a cane prior to this.  Patient is unable to provide a history due to advanced dementia.  History is mainly obtained from his daughter Tony Vasquez via phone and medical records.  Per his daughter he was in his usual state of health prior to this was ambulating with a cane up until 3 PM yesterday.  Afterwards, was no longer able to ambulate.  They were concerned for a stroke and called EMS.  No other associative symptoms.  Patient denies a headache, change in vision, difficulty swallowing, chest pain or palpitations.  On physical exam equal strength bilaterally in all 4 extremities.   ED Course: Vital signs notable for mildly elevated blood pressure.  Lab studies remarkable for worsening creatinine with concern for prerenal azotemia.  CT head unremarkable for any acute intracranial findings.  MRI ordered by EDP and pending.  UA unrevealing.  TRH asked to admit to rule out CVA.   Subjective: 8/25 A/O x1 (does not know where, when, why), very pleasant follows all commands      Assessment & Plan:   Active Problems:   HTN (hypertension)   Generalized weakness   CKD (chronic kidney disease), stage III (HCC)   Benign essential HTN   Prostate cancer (HCC)   QT prolongation   Sinus bradycardia   Diabetes mellitus type 2, uncontrolled, with complications ([place])  COVID 19 infection - 8/23 PCXR: WNL Recent Labs  Lab 10/10/18 0448 10/11/18 0310  CRP 0.9 <0.8   Recent Labs  Lab 10/10/18 0500 10/11/18 0310  DDIMER 1.98* 1.53*  -Patient does not meet  criteria for Remdesivir or Actrema treatment - Solu-Medrol 40 mg TID -Vitamins per "protocol - Titrate O2 to main SPO2> 89%  Essential HTN/Bradycardia  - DC nodal blocking agents to include metoprolol - Amlodipine 5 mg daily - With episodes of bradycardia, (new onset?),  Notated in vitals -troponin high-sensitivity 12/15 - 8/24 EKG sinus bradycardia, inferior lateral MI most likely subacute.  When compared to EKG from 22 August 2017 there are some changes however patient with negative CP, troponin negative. - Echocardiogram; hyperdynamic systolic function see results below -8/25 discussed case with  Dr. Candee Furbish cardiology concurs with current treatment plan.  Also agrees that patient would benefit from Holter monitor, states prior to discharge contact him and he will arrange for monitor to meet patient at the house.  Prolonged QT interval - Patient bradycardic at 54 bpm with QTC interval acute 538.  This increases patient's risk of torsades (QTC> 500). - Hold all QT prolonging medication -8/25 repeat EKG: QT prolongation  Generalized weakness  -Multifactorial, bradycardia, dehydration, acute on CKD stage III -Correct underlying cause - 8/24 PT/OT consult recommend home health  - MRI canceled no neurological deficit, WNL see results below -TSH WNL  Acute on CKD stage III (baseline Cr 1.79, obtained 08/25/2017) - Strict in and outs +293.86ml -Daily weight Filed Weights   10/09/18 0135 10/10/18 0500  Weight: 74 kg 78 kg  -Avoid nephrotoxins Recent Labs  Lab 10/09/18 0026 10/10/18 0448 10/10/18 0500 10/11/18 0310  CREATININE 2.16*  1.86* 1.89* 1.75*  -Most likely secondary to acute illness, dehydration from diuretics. - Hold diuretics. - Normal saline 71ml/hr  Diabetes type 2 uncontrolled with complication - [id number] hemoglobin A1c= 8.7 -8/25 Lantus 18 units nightly -8/25 NovoLog 5 units breakfast/7 units lunch/3 units dinner -Resistant SSI  HLD - Lipid panel not within  ADA/AHA guidelines -LDL goal<70 - 8/25 start Lipitor 40 mg daily     DVT prophylaxis: Subcu heparin Code Status: Full Family Communication: 8/25 spoke with Tony Vasquez (daughter) discussed plan of care in depth answered all questions.   Disposition Plan: TBD   Consultants:  Phone consult cardiology Dr. Candee Furbish   Procedures/Significant Events:  8/23 PCXR: WNL. 8/23 CT head Wo contrast: Negative acute intracranial process. 8/23 PCXR: Negative for acute cardiopulmonary disease 8/25 echocardiogram:The left ventricle has hyperdynamic systolic function, EF > 65 percent The -moderately increased left ventricular wall thickness with severe basal septal hypertrophy.    I have personally reviewed and interpreted all radiology studies and my findings are as above.  VENTILATOR SETTINGS:    Cultures   Antimicrobials: Anti-infectives (From admission, onward)   None      Devices    LINES / TUBES:      Continuous Infusions: . sodium chloride 75 mL/hr at 10/11/18 0408     Objective: Vitals:   10/11/18 0500 10/11/18 0547 10/11/18 0822 10/11/18 1236  BP:  (!) 161/74 (!) 150/69 (!) 146/75  Pulse: (!) 47 (!) 45 (!) 52 (!) 57  Resp: 15 16 16 17   Temp:  98.4 F (36.9 C) 97.9 F (36.6 C) 97.7 F (36.5 C)  TempSrc:  Oral Oral Oral  SpO2: 96% 99% 100%   Weight:      Height:        Intake/Output Summary (Last 24 hours) at 10/11/2018 1543 Last data filed at 10/11/2018 1400 Gross per 24 hour  Intake 2032.34 ml  Output 1725 ml  Net 307.34 ml   Filed Weights   10/09/18 0135 10/10/18 0500  Weight: 74 kg 78 kg    Examination:  General: A/O x1 (does not know where, when, why) negative acute respiratory distress Eyes: negative scleral hemorrhage, negative anisocoria, negative icterus ENT: Negative Runny nose, negative gingival bleeding, Neck:  Negative scars, masses, torticollis, lymphadenopathy, JVD Lungs: Clear to auscultation bilaterally without wheezes or  crackles Cardiovascular: Regular rate and rhythm without murmur gallop or rub normal S1 and S2 Abdomen: negative abdominal pain, nondistended, positive soft, bowel sounds, no rebound, no ascites, no appreciable mass Extremities: No significant cyanosis, clubbing, or edema bilateral lower extremities Skin: Negative rashes, lesions, ulcers Psychiatric:  Negative depression, negative anxiety, negative fatigue, negative mania  Central nervous system:  Cranial nerves II through XII intact, tongue/uvula midline, all extremities muscle strength 5/5, sensation intact throughout, finger nose finger bilateral within normal limits, quick finger touch bilateral within normal limits, negative dysarthria, negative expressive aphasia, negative receptive aphasia.  .     Data Reviewed: Care during the described time interval was provided by me .  I have reviewed this patient's available data, including medical history, events of note, physical examination, and all test results as part of my evaluation.   CBC: Recent Labs  Lab 10/09/18 0026 10/10/18 0448 10/11/18 0310  WBC 6.4 3.9* 9.2  NEUTROABS 4.3 2.9 7.6  HGB 15.9 14.8 14.5  HCT 48.2 45.0 43.6  MCV 93.1 91.8 91.6  PLT 162 152 0000000   Basic Metabolic Panel: Recent Labs  Lab 10/09/18 0026 10/10/18 0448 10/10/18 0500 10/11/18  0310  NA 141 134* 135 134*  K 4.1 4.1 4.2 4.0  CL 101 99 97* 99  CO2 28 22 22  21*  GLUCOSE 113* 293* 290* 279*  BUN 32* 33* 33* 44*  CREATININE 2.16* 1.86* 1.89* 1.75*  CALCIUM 9.9 8.7* 8.9 8.3*  MG  --   --  1.7 1.8  PHOS  --   --  4.3 1.7*   GFR: Estimated Creatinine Clearance: 30.9 mL/min (A) (by C-G formula based on SCr of 1.75 mg/dL (H)). Liver Function Tests: Recent Labs  Lab 10/09/18 0026 10/10/18 0500 10/11/18 0310  AST 25 26 26   ALT 16 17 18   ALKPHOS 56 51 49  BILITOT 0.7 0.6 0.4  PROT 9.1* 7.5 6.9  ALBUMIN 4.7 3.7 3.3*   No results for input(s): LIPASE, AMYLASE in the last 168 hours. No  results for input(s): AMMONIA in the last 168 hours. Coagulation Profile: No results for input(s): INR, PROTIME in the last 168 hours. Cardiac Enzymes: No results for input(s): CKTOTAL, CKMB, CKMBINDEX, TROPONINI in the last 168 hours. BNP (last 3 results) No results for input(s): PROBNP in the last 8760 hours. HbA1C: Recent Labs    10/09/18 0029  HGBA1C 8.7*   CBG: Recent Labs  Lab 10/10/18 2004 10/10/18 2337 10/11/18 0416 10/11/18 0830 10/11/18 1234  GLUCAP 375* 252* 281* 244* 233*   Lipid Profile: Recent Labs    10/10/18 0500 10/11/18 0310  CHOL 152 149  HDL 31* 27*  LDLCALC 108* 106*  TRIG 65 81  CHOLHDL 4.9 5.5   Thyroid Function Tests: Recent Labs    10/09/18 0029  TSH 2.973   Anemia Panel: Recent Labs    10/11/18 0310  FERRITIN 366*   Urine analysis:    Component Value Date/Time   COLORURINE STRAW (A) 10/09/2018 0307   APPEARANCEUR CLEAR 10/09/2018 0307   LABSPEC 1.009 10/09/2018 0307   PHURINE 5.0 10/09/2018 0307   GLUCOSEU NEGATIVE 10/09/2018 0307   HGBUR SMALL (A) 10/09/2018 0307   BILIRUBINUR NEGATIVE 10/09/2018 0307   KETONESUR NEGATIVE 10/09/2018 0307   PROTEINUR NEGATIVE 10/09/2018 0307   UROBILINOGEN 0.2 10/31/2012 0109   NITRITE NEGATIVE 10/09/2018 0307   LEUKOCYTESUR NEGATIVE 10/09/2018 0307   Sepsis Labs: @LABRCNTIP (procalcitonin:4,lacticidven:4)  ) Recent Results (from the past 240 hour(s))  SARS Coronavirus 2 [facility] order, Performed in [facility] hospital lab) Nasopharyngeal Nasopharyngeal Swab     Status: Abnormal   Collection Time: 10/09/18  6:41 AM   Specimen: Nasopharyngeal Swab  Result Value Ref Range Status   SARS Coronavirus 2 POSITIVE (A) NEGATIVE Final    Comment: RESULT CALLED TO, READ BACK BY AND VERIFIED WITH: S CLAPP,RN 10/09/18 1228 RHOLMES (NOTE) If result is NEGATIVE SARS-CoV-2 target nucleic acids are NOT DETECTED. The SARS-CoV-2 RNA is generally detectable in upper and lower  respiratory specimens  during the acute phase of infection. The lowest  concentration of SARS-CoV-2 viral copies this assay can detect is 250  copies / mL. A negative result does not preclude SARS-CoV-2 infection  and should not be used as the sole basis for treatment or other  patient management decisions.  A negative result may occur with  improper specimen collection / handling, submission of specimen other  than nasopharyngeal swab, presence of viral mutation(s) within the  areas targeted by this assay, and inadequate number of viral copies  (<250 copies / mL). A negative result must be combined with clinical  observations, patient history, and epidemiological information. If result is POSITIVE SARS-CoV-2 target  nucleic acids are DETECTED. The S ARS-CoV-2 RNA is generally detectable in upper and lower  respiratory specimens during the acute phase of infection.  Positive  results are indicative of active infection with SARS-CoV-2.  Clinical  correlation with patient history and other diagnostic information is  necessary to determine patient infection status.  Positive results do  not rule out bacterial infection or co-infection with other viruses. If result is PRESUMPTIVE POSTIVE SARS-CoV-2 nucleic acids MAY BE PRESENT.   A presumptive positive result was obtained on the submitted specimen  and confirmed on repeat testing.  While 2019 novel coronavirus  (SARS-CoV-2) nucleic acids may be present in the submitted sample  additional confirmatory testing may be necessary for epidemiological  and / or clinical management purposes  to differentiate between  SARS-CoV-2 and other Sarbecovirus currently known to infect humans.  If clinically indicated additional testing with an alternate test  methodology [phone]) is adv ised. The SARS-CoV-2 RNA is generally  detectable in upper and lower respiratory specimens during the acute  phase of infection. The expected result is Negative. Fact Sheet for Patients:   StrictlyIdeas.no Fact Sheet for Healthcare Providers: BankingDealers.co.za This test is not yet approved or cleared by the [place] FDA and has been authorized for detection and/or diagnosis of SARS-CoV-2 by FDA under an Emergency Use Authorization (EUA).  This EUA will remain in effect (meaning this test can be used) for the duration of the COVID-19 declaration under Section 564(b)(1) of the Act, 21 U.S.C. section 360bbb-3(b)(1), unless the authorization is terminated or revoked sooner. Performed at [facility], [place] [address] [place], [place] [address]          Radiology Studies: No results found.      Scheduled Meds: . amLODipine  5 mg Oral Daily  . atorvastatin  40 mg Oral q1800  . cholecalciferol  1,000 Units Oral Daily  . heparin injection (subcutaneous)  5,000 Units Subcutaneous Q8H  . insulin aspart  0-20 Units Subcutaneous Q4H  . insulin aspart  3 Units Subcutaneous Q supper  . [START ON 10/12/2018] insulin aspart  5 Units Subcutaneous Q breakfast  . insulin aspart  7 Units Subcutaneous Q lunch  . insulin glargine  18 Units Subcutaneous QHS  . methylPREDNISolone (SOLU-MEDROL) injection  40 mg Intravenous TID  . vitamin C  1,000 mg Oral Daily  . zinc sulfate  220 mg Oral Daily   Continuous Infusions: . sodium chloride 75 mL/hr at 10/11/18 0408     LOS: 2 days   The patient is critically ill with multiple organ systems failure and requires high complexity decision making for assessment and support, frequent evaluation and titration of therapies, application of advanced monitoring technologies and extensive interpretation of multiple databases. Critical Care Time devoted to patient care services described in this note  Time spent: 40 minutes     , Geraldo Docker, MD [facility] Pager [phone]  If 7PM-7AM, please contact night-coverage [email or web address] Password [facility] 10/11/2018,  3:43 PM

## 2018-10-12 LAB — CBC WITH DIFFERENTIAL/PLATELET
Abs Immature Granulocytes: 0.06 10*3/uL (ref 0.00–0.07)
Basophils Absolute: 0 10*3/uL (ref 0.0–0.1)
Basophils Relative: 0 %
Eosinophils Absolute: 0 10*3/uL (ref 0.0–0.5)
Eosinophils Relative: 0 %
HCT: 43 % (ref 39.0–52.0)
Hemoglobin: 14.2 g/dL (ref 13.0–17.0)
Immature Granulocytes: 1 %
Lymphocytes Relative: 8 %
Lymphs Abs: 0.9 10*3/uL (ref 0.7–4.0)
MCH: 30.3 pg (ref 26.0–34.0)
MCHC: 33 g/dL (ref 30.0–36.0)
MCV: 91.9 fL (ref 80.0–100.0)
Monocytes Absolute: 0.4 10*3/uL (ref 0.1–1.0)
Monocytes Relative: 4 %
Neutro Abs: 9 10*3/uL — ABNORMAL HIGH (ref 1.7–7.7)
Neutrophils Relative %: 87 %
Platelets: 167 10*3/uL (ref 150–400)
RBC: 4.68 MIL/uL (ref 4.22–5.81)
RDW: 12.7 % (ref 11.5–15.5)
WBC: 10.4 10*3/uL (ref 4.0–10.5)
nRBC: 0 % (ref 0.0–0.2)

## 2018-10-12 LAB — COMPREHENSIVE METABOLIC PANEL
ALT: 23 U/L (ref 0–44)
AST: 27 U/L (ref 15–41)
Albumin: 3.3 g/dL — ABNORMAL LOW (ref 3.5–5.0)
Alkaline Phosphatase: 48 U/L (ref 38–126)
Anion gap: 9 (ref 5–15)
BUN: 47 mg/dL — ABNORMAL HIGH (ref 8–23)
CO2: 22 mmol/L (ref 22–32)
Calcium: 8.3 mg/dL — ABNORMAL LOW (ref 8.9–10.3)
Chloride: 107 mmol/L (ref 98–111)
Creatinine, Ser: 1.81 mg/dL — ABNORMAL HIGH (ref 0.61–1.24)
GFR calc Af Amer: 39 mL/min — ABNORMAL LOW (ref >60)
GFR calc non Af Amer: 34 mL/min — ABNORMAL LOW (ref >60)
Glucose, Bld: 263 mg/dL — ABNORMAL HIGH (ref 70–99)
Potassium: 3.5 mmol/L (ref 3.5–5.1)
Sodium: 138 mmol/L (ref 135–145)
Total Bilirubin: 0.3 mg/dL (ref 0.3–1.2)
Total Protein: 6.9 g/dL (ref 6.5–8.1)

## 2018-10-12 LAB — GLUCOSE, CAPILLARY
Glucose-Capillary: 114 mg/dL — ABNORMAL HIGH (ref 70–99)
Glucose-Capillary: 219 mg/dL — ABNORMAL HIGH (ref 70–99)
Glucose-Capillary: 285 mg/dL — ABNORMAL HIGH (ref 70–99)
Glucose-Capillary: 336 mg/dL — ABNORMAL HIGH (ref 70–99)
Glucose-Capillary: 365 mg/dL — ABNORMAL HIGH (ref 70–99)
Glucose-Capillary: 368 mg/dL — ABNORMAL HIGH (ref 70–99)
Glucose-Capillary: 371 mg/dL — ABNORMAL HIGH (ref 70–99)
Glucose-Capillary: 432 mg/dL — ABNORMAL HIGH (ref 70–99)

## 2018-10-12 LAB — PHOSPHORUS: Phosphorus: 1 mg/dL — CL (ref 2.5–4.6)

## 2018-10-12 LAB — D-DIMER, QUANTITATIVE: D-Dimer, Quant: 1.06 ug/mL-FEU — ABNORMAL HIGH (ref 0.00–0.50)

## 2018-10-12 LAB — FERRITIN: Ferritin: 381 ng/mL — ABNORMAL HIGH (ref 24–336)

## 2018-10-12 LAB — MAGNESIUM: Magnesium: 1.9 mg/dL (ref 1.7–2.4)

## 2018-10-12 LAB — C-REACTIVE PROTEIN: CRP: <0.8 mg/dL (ref <1.0)

## 2018-10-12 MED ORDER — POTASSIUM PHOSPHATES 15 MMOLE/5ML IV SOLN
30.0000 mmol | Freq: Once | INTRAVENOUS | Status: AC
  Administered 2018-10-12: 30 mmol via INTRAVENOUS
  Filled 2018-10-12: qty 10

## 2018-10-12 MED ORDER — INSULIN ASPART 100 UNIT/ML ~~LOC~~ SOLN
12.0000 [IU] | Freq: Once | SUBCUTANEOUS | Status: AC
  Administered 2018-10-12: 12 [IU] via SUBCUTANEOUS

## 2018-10-12 MED ORDER — AMLODIPINE BESYLATE 5 MG PO TABS
5.0000 mg | ORAL_TABLET | Freq: Once | ORAL | Status: AC
  Administered 2018-10-12: 08:00:00 5 mg via ORAL

## 2018-10-12 MED ORDER — AMLODIPINE BESYLATE 5 MG PO TABS
10.0000 mg | ORAL_TABLET | Freq: Every day | ORAL | Status: DC
  Administered 2018-10-13 – 2018-10-14 (×2): 10 mg via ORAL
  Filled 2018-10-12 (×2): qty 2

## 2018-10-12 MED ORDER — INSULIN ASPART 100 UNIT/ML ~~LOC~~ SOLN
6.0000 [IU] | Freq: Three times a day (TID) | SUBCUTANEOUS | Status: DC
  Administered 2018-10-12 – 2018-10-14 (×8): 6 [IU] via SUBCUTANEOUS

## 2018-10-12 MED ORDER — INSULIN GLARGINE 100 UNIT/ML ~~LOC~~ SOLN
15.0000 [IU] | Freq: Two times a day (BID) | SUBCUTANEOUS | Status: DC
  Administered 2018-10-12 (×2): 15 [IU] via SUBCUTANEOUS
  Filled 2018-10-12 (×3): qty 0.15

## 2018-10-12 MED ORDER — POTASSIUM CHLORIDE CRYS ER 20 MEQ PO TBCR
20.0000 meq | EXTENDED_RELEASE_TABLET | Freq: Once | ORAL | Status: AC
  Administered 2018-10-12: 20 meq via ORAL
  Filled 2018-10-12: qty 1

## 2018-10-12 MED ORDER — METHYLPREDNISOLONE SODIUM SUCC 40 MG IJ SOLR: 40.0000 mg | Freq: Every day | INTRAMUSCULAR | Status: DC

## 2018-10-12 MED ORDER — POTASSIUM CHLORIDE CRYS ER 20 MEQ PO TBCR: 40.0000 meq | EXTENDED_RELEASE_TABLET | Freq: Once | ORAL | Status: DC

## 2018-10-12 NOTE — Progress Notes (Addendum)
PROGRESS NOTE                                                                                                                                                                                                             Patient Demographics:    Tony Vasquez, is a 85 y.o. male, DOB - [birth year], [id number]  Admit date - 10/08/2018   Admitting Physician Kayleen Memos, DO  Outpatient Primary MD for the patient is [name], [name], MD  LOS - 3   Chief Complaint  Patient presents with  . Weakness       Brief Narrative    85 year old BM PMHx Dementia, prostate cancer. S/P  XRT , CKD 3 follows with nephrology, HTN, HLD, diabetes type 2 uncontrolled with complication. Ambulatory dysfunction uses a cane at baseline  [facility] EDfrom homedue to generalized weakness with inability to ambulate. Patient was ambulating withwith a caneprior to this.Patient is unable to provide a history due to advanced dementia. History is mainly obtained from [name] and medical records. Per his daughter he was in his usual state of health prior to this was ambulating with a cane up until3 PM yesterday. Afterwards, wasno longer able to ambulate. They were concerned for a stroke and called EMS. No other associative symptoms. Patient denies a headache, change in vision, difficulty swallowing, chest pain or palpitations. On physical exam equal strength bilaterally in all 4 extremities.   ED Course:Vital signs notable for mildly elevated blood pressure. Lab studies remarkable for worsening creatinine with concern for prerenal azotemia. CT head unremarkable for any acute intracranial findings. MRI ordered by EDP and pending. UA unrevealing. TRH asked to admit to rule out CVA.    Subjective:    [facility] today reports he is feeling better, denies any dyspnea, chest pain or cough, he remains afebrile.   Assessment  &  Plan :    Active Problems:   HTN (hypertension)   Generalized weakness   CKD (chronic kidney disease), stage III (HCC)   Benign essential HTN   Prostate cancer (HCC)   QT prolongation   Sinus bradycardia   Diabetes mellitus type 2, uncontrolled, with complications (HCC)    COVID 19 infection -Chest x-ray on 8/23 within normal limit -No hypoxia -No indication for Actemra, Remdesivir or plasma -Continue to trend  inflammatory markers closely, D-dimers trending down, CRP within normal limit, which is reassuring.  COVID-19 Labs  Recent Labs    10/10/18 0448 10/10/18 0500 10/11/18 0310 10/12/18 0235  DDIMER  --  1.98* 1.53* 1.06*  FERRITIN  --   --  366* 381*  CRP 0.9  --  <0.8 <0.8    Lab Results  Component Value Date   SARSCOV2NAA POSITIVE (A) 10/09/2018   Hypertension -Start beta-blockers given bradycardia, darted on Norvasc, blood pressure remains elevated, will increase Norvasc to 10 mg oral daily.  Bradycardia -D/C  beta-blockers, improved, currently heart rate averaging in the 60s, symptomatic. -Previous MD discussed with cardiology, and Holter monitor on discharge.  Prolonged QTC -QT prolonging medication, monitor electrolytes closely, please potassium, magnesium within normal limit, will replete phosphorus.  Generalized weakness/deconditioning -Continue with PT, recommendation for home health  Hypophosphatemia -Significantly low at 1 today, will replace with potassium phosphate IV, recheck in a.m.  AKI on CKD stage III -Baseline creatinine 1.7, was 2.1 on admission, improved with IV fluids, will DC fluids today as like to be euvolemic.  Type 2 diabetes mellitus -Newly controlled, A1c is 8.7, I have increased his Lantus to 15 units twice daily, continue with insulin sliding scale, and 5 units before meals  Code Status : Full  Family Communication  : D/W Sherral Hammers D/W family 8/25, wil call today  Disposition Plan  : Home in 1 -2 days  Barriers For  Discharge : Pressure, CBG poorly controlled, remains with hypophosphatemia will need IV placement  Consults  :  D/W cardiolgoy VIA phone  Procedures  : None  DVT Prophylaxis  : [place] heparin  Lab Results  Component Value Date   PLT 167 10/12/2018    Antibiotics  :    Anti-infectives (From admission, onward)   None        Objective:   Vitals:   10/12/18 0000 10/12/18 0335 10/12/18 0500 10/12/18 0757  BP:  128/67  (!) 160/72  Pulse: (!) 53 (!) 51  92  Resp: 12 16    Temp:  97.6 F (36.4 C)  97.8 F (36.6 C)  TempSrc:  Oral  Oral  SpO2: 97% 100%  99%  Weight:   74.4 kg   Height:        Wt Readings from Last 3 Encounters:  10/12/18 74.4 kg  08/24/17 74 kg  10/04/14 80.1 kg     Intake/Output Summary (Last 24 hours) at 10/12/2018 1141 Last data filed at 10/12/2018 0858 Gross per 24 hour  Intake 1072.89 ml  Output 900 ml  Net 172.89 ml     Physical Exam  Awake Alert, pleasant, sitting in recliner eating breakfast this morning . Symmetrical Chest wall movement, Good air movement bilaterally, CTAB RRR,No Gallops,Rubs or new Murmurs, No Parasternal Heave (heart rate noted to be on the lower side on telemetry's, late 50s-early 60s, but no heart block or pauses) +ve B.Sounds, Abd Soft, No tenderness, No rebound - guarding or rigidity. No Cyanosis, Clubbing or edema, No new Rash or bruise     Data Review:    CBC Recent Labs  Lab 10/09/18 0026 10/10/18 0448 10/11/18 0310 10/12/18 0235  WBC 6.4 3.9* 9.2 10.4  HGB 15.9 14.8 14.5 14.2  HCT 48.2 45.0 43.6 43.0  PLT 162 152 162 167  MCV 93.1 91.8 91.6 91.9  MCH 30.7 30.2 30.5 30.3  MCHC 33.0 32.9 33.3 33.0  RDW 12.7 12.6 12.5 12.7  LYMPHSABS 1.3 0.8 1.2 0.9  MONOABS  0.7 0.1 0.3 0.4  EOSABS 0.0 0.0 0.0 0.0  BASOSABS 0.0 0.0 0.0 0.0    Chemistries  Recent Labs  Lab 10/09/18 0026 10/10/18 0448 10/10/18 0500 10/11/18 0310 10/12/18 0235  NA 141 134* 135 134* 138  K 4.1 4.1 4.2 4.0 3.5  CL 101 99 97*  99 107  CO2 28 22 22  21* 22  GLUCOSE 113* 293* 290* 279* 263*  BUN 32* 33* 33* 44* 47*  CREATININE 2.16* 1.86* 1.89* 1.75* 1.81*  CALCIUM 9.9 8.7* 8.9 8.3* 8.3*  MG  --   --  1.7 1.8 1.9  AST 25  --  26 26 27   ALT 16  --  17 18 23   ALKPHOS 56  --  51 49 48  BILITOT 0.7  --  0.6 0.4 0.3   ------------------------------------------------------------------------------------------------------------------ Recent Labs    10/10/18 0500 10/11/18 0310  CHOL 152 149  HDL 31* 27*  LDLCALC 108* 106*  TRIG 65 81  CHOLHDL 4.9 5.5    Lab Results  Component Value Date   HGBA1C 8.7 (H) 10/09/2018   ------------------------------------------------------------------------------------------------------------------ No results for input(s): TSH, T4TOTAL, T3FREE, THYROIDAB in the last 72 hours.  Invalid input(s): FREET3 ------------------------------------------------------------------------------------------------------------------ Recent Labs    10/11/18 0310 10/12/18 0235  FERRITIN 366* 381*    Coagulation profile No results for input(s): INR, PROTIME in the last 168 hours.  Recent Labs    10/11/18 0310 10/12/18 0235  DDIMER 1.53* 1.06*    Cardiac Enzymes No results for input(s): CKMB, TROPONINI, MYOGLOBIN in the last 168 hours.  Invalid input(s): CK ------------------------------------------------------------------------------------------------------------------ No results found for: BNP  Inpatient Medications  Scheduled Meds: . [START ON 10/13/2018] amLODipine  10 mg Oral Daily  . atorvastatin  40 mg Oral q1800  . cholecalciferol  1,000 Units Oral Daily  . heparin injection (subcutaneous)  5,000 Units Subcutaneous Q8H  . insulin aspart  0-20 Units Subcutaneous Q4H  . insulin aspart  6 Units Subcutaneous TID WC  . insulin glargine  15 Units Subcutaneous BID  . methylPREDNISolone (SOLU-MEDROL) injection  40 mg Intravenous TID  . vitamin C  1,000 mg Oral Daily  . zinc  sulfate  220 mg Oral Daily   Continuous Infusions: . sodium chloride 75 mL/hr at 10/12/18 0753  . potassium PHOSPHATE IVPB (in mmol) 30 mmol (10/12/18 0858)   PRN Meds:.acetaminophen, albuterol, ondansetron (ZOFRAN) IV, zolpidem  Micro Results Recent Results (from the past 240 hour(s))  SARS Coronavirus 2 [facility] order, Performed in [facility] hospital lab) Nasopharyngeal Nasopharyngeal Swab     Status: Abnormal   Collection Time: 10/09/18  6:41 AM   Specimen: Nasopharyngeal Swab  Result Value Ref Range Status   SARS Coronavirus 2 POSITIVE (A) NEGATIVE Final    Comment: RESULT CALLED TO, READ BACK BY AND VERIFIED WITH: S CLAPP,RN 10/09/18 1228 RHOLMES (NOTE) If result is NEGATIVE SARS-CoV-2 target nucleic acids are NOT DETECTED. The SARS-CoV-2 RNA is generally detectable in upper and lower  respiratory specimens during the acute phase of infection. The lowest  concentration of SARS-CoV-2 viral copies this assay can detect is 250  copies / mL. A negative result does not preclude SARS-CoV-2 infection  and should not be used as the sole basis for treatment or other  patient management decisions.  A negative result may occur with  improper specimen collection / handling, submission of specimen other  than nasopharyngeal swab, presence of viral mutation(s) within the  areas targeted by this assay, and inadequate number of viral copies  (<  250 copies / mL). A negative result must be combined with clinical  observations, patient history, and epidemiological information. If result is POSITIVE SARS-CoV-2 target nucleic acids are DETECTED. The S ARS-CoV-2 RNA is generally detectable in upper and lower  respiratory specimens during the acute phase of infection.  Positive  results are indicative of active infection with SARS-CoV-2.  Clinical  correlation with patient history and other diagnostic information is  necessary to determine patient infection status.  Positive results do  not  rule out bacterial infection or co-infection with other viruses. If result is PRESUMPTIVE POSTIVE SARS-CoV-2 nucleic acids MAY BE PRESENT.   A presumptive positive result was obtained on the submitted specimen  and confirmed on repeat testing.  While 2019 novel coronavirus  (SARS-CoV-2) nucleic acids may be present in the submitted sample  additional confirmatory testing may be necessary for epidemiological  and / or clinical management purposes  to differentiate between  SARS-CoV-2 and other Sarbecovirus currently known to infect humans.  If clinically indicated additional testing with an alternate test  methodology [phone]) is adv ised. The SARS-CoV-2 RNA is generally  detectable in upper and lower respiratory specimens during the acute  phase of infection. The expected result is Negative. Fact Sheet for Patients:  StrictlyIdeas.no Fact Sheet for Healthcare Providers: BankingDealers.co.za This test is not yet approved or cleared by the [place] FDA and has been authorized for detection and/or diagnosis of SARS-CoV-2 by FDA under an Emergency Use Authorization (EUA).  This EUA will remain in effect (meaning this test can be used) for the duration of the COVID-19 declaration under Section 564(b)(1) of the Act, 21 U.S.C. section 360bbb-3(b)(1), unless the authorization is terminated or revoked sooner. Performed at [facility], [place] [address] [place], [place] [address]     Radiology Reports Ct Head Wo Contrast  Result Date: 10/09/2018 CLINICAL DATA:  Patient with right-sided weakness. EXAM: CT HEAD WITHOUT CONTRAST TECHNIQUE: Contiguous axial images were obtained from the base of the skull through the vertex without intravenous contrast. COMPARISON:  Brain CT 09/17/2016 FINDINGS: Brain: Ventricles and sulci are prominent compatible with atrophy. Motion artifact limits evaluation. No evidence for acute cortically  based infarct, intracranial hemorrhage, mass lesion or mass-effect. Vascular: Unremarkable Skull: Intact Sinuses/Orbits: Paranasal sinuses are well aerated. Mastoid air cells are unremarkable. Other: None. IMPRESSION: No acute intracranial process. Atrophy and chronic microvascular ischemic changes. Electronically Signed   By: Lovey Newcomer M.D.   On: 10/09/2018 05:32   Dg Chest Port 1 View  Result Date: 10/09/2018 CLINICAL DATA:  Generalized weakness, dementia EXAM: PORTABLE CHEST 1 VIEW COMPARISON:  08/23/2017 FINDINGS: Lungs are clear.  No pleural effusion or pneumothorax. The heart is normal in size. IMPRESSION: No evidence of acute cardiopulmonary disease. Electronically Signed   By: Julian Hy M.D.   On: 10/09/2018 13:40    Phillips Climes M.D on 10/12/2018 at 11:41 AM  Between 7am to 7pm - Pager - [phone]  After 7pm go to [email or web address] - password [facility]  [facility] -  Office  [phone]

## 2018-10-12 NOTE — Progress Notes (Signed)
Occupational Therapy Treatment Patient Details Name: Tony Vasquez MRN: [id number] DOB: 06-Oct-1932 Today's Date: 10/12/2018    History of present illness 85 year old male with past medical history significant for dementia, prostate cancer post radiation, CKD 3 follows with nephrology, hypertension, hyperlipidemia, type 2 diabetes, ambulatory dysfunction uses a cane at baseline who presented to [facility] ED from home due to generalized weakness with inability to ambulate. CT - for CVA. Covid +.   OT comments  On entry to room, blanket soiled with wet bloody area - IV catheter apparently out and leaking - nsg notified. Pt making good progress. Pt able to ambulate @ room and into bathroom, complete ADL and return to chair with S @ RW level on RA. VSS. Continue to recommend [place] and 24/7.   Follow Up Recommendations  Home health OT;Supervision/Assistance - 24 hour    Equipment Recommendations  None recommended by OT    Recommendations for Other Services      Precautions / Restrictions Precautions Precautions: Fall       Mobility Bed Mobility               General bed mobility comments: OOB in chair  Transfers Overall transfer level: Needs assistance Equipment used: Rolling walker (2 wheeled) Transfers: Sit to/from Stand Sit to Stand: Supervision              Balance Overall balance assessment: Needs assistance   Sitting balance-Leahy Scale: Good       Standing balance-Leahy Scale: Fair                             ADL either performed or assessed with clinical judgement   ADL Overall ADL's : Needs assistance/impaired Eating/Feeding: Set up   Grooming: Set up;Sitting   Upper Body Bathing: Set up;Sitting   Lower Body Bathing: Supervison/ safety;Set up;Sit to/from stand   Upper Body Dressing : Set up;Sitting   Lower Body Dressing: Supervision/safety;Sit to/from stand   Toilet Transfer: Supervision/safety;RW;Ambulation   Toileting- Marine scientist and Hygiene: Supervision/safety;Sit to/from stand       Functional mobility during ADLs: Supervision/safety;Rolling walker General ADL Comments: ADL @ sit - stand leve with use of RW; able to lean over to wash feet from standing position     Vision       Perception     Praxis      Cognition Arousal/Alertness: Awake/alert Behavior During Therapy: WFL for tasks assessed/performed Overall Cognitive Status: No family/caregiver present to determine baseline cognitive functioning Area of Impairment: Orientation;Attention;Memory;Safety/judgement;Awareness;Problem solving                 Orientation Level: Disoriented to;Time;Situation Current Attention Level: Sustained Memory: Decreased short-term memory   Safety/Judgement: Decreased awareness of safety;Decreased awareness of deficits Awareness: Emergent Problem Solving: Slow processing General Comments: most likely close to baseline        Exercises     Shoulder Instructions       General Comments      Pertinent Vitals/ Pain       Pain Assessment: Faces Faces Pain Scale: No hurt  Home Living                                          Prior Functioning/Environment              Frequency  Min 2X/week  Progress Toward Goals  OT Goals(current goals can now be found in the care plan section)  Progress towards OT goals: Progressing toward goals  Acute Rehab OT Goals Patient Stated Goal: Per family  - to return home OT Goal Formulation: With patient/family Time For Goal Achievement: 10/24/18 Potential to Achieve Goals: Good ADL Goals Pt Will Perform Lower Body Bathing: with modified independence;sit to/from stand Pt Will Transfer to Toilet: with modified independence;ambulating;regular height toilet Pt Will Perform Toileting - Clothing Manipulation and hygiene: with modified independence;sit to/from stand  Plan Discharge plan remains appropriate     Co-evaluation                 AM-PAC OT "6 Clicks" Daily Activity     Outcome Measure   Help from another person eating meals?: None Help from another person taking care of personal grooming?: A Little Help from another person toileting, which includes using toliet, bedpan, or urinal?: A Little Help from another person bathing (including washing, rinsing, drying)?: A Little Help from another person to put on and taking off regular upper body clothing?: A Little Help from another person to put on and taking off regular lower body clothing?: A Little 6 Click Score: 19    End of Session Equipment Utilized During Treatment: Rolling walker  OT Visit Diagnosis: Unsteadiness on feet (R26.81);Muscle weakness (generalized) (M62.81);Other symptoms and signs involving cognitive function   Activity Tolerance Patient tolerated treatment well   Patient Left in chair;with call bell/phone within reach   Nurse Communication Mobility status;Other (comment)(IV out)        Time: [id number] OT Time Calculation (min): 28 min  Charges: OT General Charges $OT Visit: 1 Visit OT Treatments $Self Care/Home Management : 23-37 mins  Maurie Boettcher, OT/L   Acute OT Clinical Specialist [place] Pager [phone] Office [phone]    [facility] 10/12/2018, 1:51 PM

## 2018-10-12 NOTE — Progress Notes (Signed)
Notified MD of critical value. Phosphorus of 1.0

## 2018-10-13 LAB — CBC WITH DIFFERENTIAL/PLATELET
Abs Immature Granulocytes: 0.22 10*3/uL — ABNORMAL HIGH (ref 0.00–0.07)
Basophils Absolute: 0 10*3/uL (ref 0.0–0.1)
Basophils Relative: 0 %
Eosinophils Absolute: 0 10*3/uL (ref 0.0–0.5)
Eosinophils Relative: 0 %
HCT: 40.6 % (ref 39.0–52.0)
Hemoglobin: 13.5 g/dL (ref 13.0–17.0)
Immature Granulocytes: 2 %
Lymphocytes Relative: 12 %
Lymphs Abs: 1.4 10*3/uL (ref 0.7–4.0)
MCH: 30.2 pg (ref 26.0–34.0)
MCHC: 33.3 g/dL (ref 30.0–36.0)
MCV: 90.8 fL (ref 80.0–100.0)
Monocytes Absolute: 0.6 10*3/uL (ref 0.1–1.0)
Monocytes Relative: 5 %
Neutro Abs: 9.7 10*3/uL — ABNORMAL HIGH (ref 1.7–7.7)
Neutrophils Relative %: 81 %
Platelets: 155 10*3/uL (ref 150–400)
RBC: 4.47 MIL/uL (ref 4.22–5.81)
RDW: 12.7 % (ref 11.5–15.5)
WBC: 11.9 10*3/uL — ABNORMAL HIGH (ref 4.0–10.5)
nRBC: 0 % (ref 0.0–0.2)

## 2018-10-13 LAB — COMPREHENSIVE METABOLIC PANEL
ALT: 31 U/L (ref 0–44)
AST: 27 U/L (ref 15–41)
Albumin: 3.3 g/dL — ABNORMAL LOW (ref 3.5–5.0)
Alkaline Phosphatase: 47 U/L (ref 38–126)
Anion gap: 8 (ref 5–15)
BUN: 43 mg/dL — ABNORMAL HIGH (ref 8–23)
CO2: 22 mmol/L (ref 22–32)
Calcium: 7.9 mg/dL — ABNORMAL LOW (ref 8.9–10.3)
Chloride: 106 mmol/L (ref 98–111)
Creatinine, Ser: 1.62 mg/dL — ABNORMAL HIGH (ref 0.61–1.24)
GFR calc Af Amer: 45 mL/min — ABNORMAL LOW (ref >60)
GFR calc non Af Amer: 39 mL/min — ABNORMAL LOW (ref >60)
Glucose, Bld: 192 mg/dL — ABNORMAL HIGH (ref 70–99)
Potassium: 3.9 mmol/L (ref 3.5–5.1)
Sodium: 136 mmol/L (ref 135–145)
Total Bilirubin: 0.4 mg/dL (ref 0.3–1.2)
Total Protein: 6.2 g/dL — ABNORMAL LOW (ref 6.5–8.1)

## 2018-10-13 LAB — GLUCOSE, CAPILLARY
Glucose-Capillary: 154 mg/dL — ABNORMAL HIGH (ref 70–99)
Glucose-Capillary: 189 mg/dL — ABNORMAL HIGH (ref 70–99)
Glucose-Capillary: 260 mg/dL — ABNORMAL HIGH (ref 70–99)
Glucose-Capillary: 302 mg/dL — ABNORMAL HIGH (ref 70–99)
Glucose-Capillary: 369 mg/dL — ABNORMAL HIGH (ref 70–99)
Glucose-Capillary: 370 mg/dL — ABNORMAL HIGH (ref 70–99)

## 2018-10-13 LAB — C-REACTIVE PROTEIN: CRP: <0.8 mg/dL (ref <1.0)

## 2018-10-13 LAB — D-DIMER, QUANTITATIVE: D-Dimer, Quant: 0.83 ug/mL-FEU — ABNORMAL HIGH (ref 0.00–0.50)

## 2018-10-13 LAB — MAGNESIUM: Magnesium: 1.8 mg/dL (ref 1.7–2.4)

## 2018-10-13 LAB — FERRITIN: Ferritin: 357 ng/mL — ABNORMAL HIGH (ref 24–336)

## 2018-10-13 LAB — PHOSPHORUS: Phosphorus: 1.9 mg/dL — ABNORMAL LOW (ref 2.5–4.6)

## 2018-10-13 MED ORDER — K PHOS MONO-SOD PHOS DI & MONO 155-852-130 MG PO TABS
500.0000 mg | ORAL_TABLET | Freq: Two times a day (BID) | ORAL | Status: AC
  Administered 2018-10-13 (×2): 500 mg via ORAL
  Filled 2018-10-13 (×2): qty 2

## 2018-10-13 MED ORDER — INSULIN GLARGINE 100 UNIT/ML ~~LOC~~ SOLN
22.0000 [IU] | Freq: Every day | SUBCUTANEOUS | Status: DC
  Administered 2018-10-13: 22 [IU] via SUBCUTANEOUS
  Filled 2018-10-13 (×2): qty 0.22

## 2018-10-13 NOTE — Progress Notes (Signed)
Physical Therapy Treatment Patient Details Name: Tony Vasquez MRN: [id number] DOB: Oct 10, 1932 Today's Date: 10/13/2018    History of Present Illness 85 year old male with past medical history significant for dementia, prostate cancer post radiation, CKD 3 follows with nephrology, hypertension, hyperlipidemia, type 2 diabetes, ambulatory dysfunction uses a cane at baseline who presented to [facility] ED from home due to generalized weakness with inability to ambulate. CT - for CVA. Covid +.    PT Comments    Patient asleep in chair on arrival. Agreeable to participate and after assisting to [facility], initiated session with standing balance activities and progressed to gait training with RW. Patient not familiar with using RW and actually looked better when using the cane on previous visit. Will try cane again next visit.      Follow Up Recommendations  Home health PT;Supervision/Assistance - 24 hour     Equipment Recommendations  None recommended by PT    Recommendations for Other Services       Precautions / Restrictions Precautions Precautions: Fall Restrictions Weight Bearing Restrictions: No    Mobility  Bed Mobility                  Transfers Overall transfer level: Needs assistance Equipment used: Rolling walker (2 wheeled) Transfers: Sit to/from Stand;Stand Pivot Transfers Sit to Stand: Supervision Stand pivot transfers: Min guard       General transfer comment: stand pivot minguard due to lines, close proximity of RW to [facility] and near entanglement; he did not need help negotiating turn with RW  Ambulation/Gait Ambulation/Gait assistance: Min guard Gait Distance (Feet): 90 Feet Assistive device: Rolling walker (2 wheeled) Gait Pattern/deviations: Step-through pattern;Decreased stride length;Trunk flexed     General Gait Details: attempted RW to assess if improves his upright posture and overall stability; he pushed it too far ahead; would revert to cane next  session because that is what he is used to   [employer]    Modified Rankin (Stroke Patients Only)       Balance Overall balance assessment: Needs assistance Sitting-balance support: No upper extremity supported;Feet supported Sitting balance-Leahy Scale: Good     Standing balance support: No upper extremity supported Standing balance-Leahy Scale: Good Standing balance comment: able to stand with back to chair, no UE support and wtshift left and right x 1 minute without imbalance                            Cognition Arousal/Alertness: Awake/alert Behavior During Therapy: WFL for tasks assessed/performed Overall Cognitive Status: No family/caregiver present to determine baseline cognitive functioning Area of Impairment: Orientation;Safety/judgement;Awareness;Problem solving                 Orientation Level: Disoriented to;Time;Situation       Safety/Judgement: Decreased awareness of safety;Decreased awareness of deficits Awareness: Emergent Problem Solving: Slow processing;Requires verbal cues        Exercises Other Exercises Other Exercises: standing in front of chair with HHA, alternating step forward and backward each LE; then fwd-fwd-back-back  Other Exercises: lateral wt-shifts without UE support    General Comments        Pertinent Vitals/Pain Pain Assessment: No/denies pain Faces Pain Scale: No hurt    Home Living                      Prior Function  PT Goals (current goals can now be found in the care plan section) Acute Rehab PT Goals Patient Stated Goal: Per family  - to return home Time For Goal Achievement: 10/24/18 Potential to Achieve Goals: Good Progress towards PT goals: Progressing toward goals    Frequency    Min 3X/week      PT Plan Current plan remains appropriate    Co-evaluation              AM-PAC PT "6 Clicks" Mobility   Outcome Measure   Help needed turning from your back to your side while in a flat bed without using bedrails?: None Help needed moving from lying on your back to sitting on the side of a flat bed without using bedrails?: None Help needed moving to and from a bed to a chair (including a wheelchair)?: A Little Help needed standing up from a chair using your arms (e.g., wheelchair or bedside chair)?: A Little Help needed to walk in hospital room?: A Little Help needed climbing 3-5 steps with a railing? : A Little 6 Click Score: 20    End of Session   Activity Tolerance: Patient tolerated treatment well Patient left: in chair;with call bell/phone within reach;with chair alarm set Nurse Communication: Mobility status;Other (comment)(has lap belt alarm system; educated RN on how to use) PT Visit Diagnosis: Unsteadiness on feet (R26.81);Difficulty in walking, not elsewhere classified (R26.2)     Time: 1350-1420 PT Time Calculation (min) (ACUTE ONLY): 30 min  Charges:  $Gait Training: 8-22 mins $Therapeutic Activity: 8-22 mins                       Tony Vasquez, PT       Tony Vasquez 10/13/2018, 4:45 PM

## 2018-10-13 NOTE — Progress Notes (Signed)
PROGRESS NOTE                                                                                                                                                                                                             Patient Demographics:    Tony Vasquez, is a 85 y.o. male, DOB - 1932-08-15, YE:[id number]  Admit date - 10/08/2018   Admitting Physician Kayleen Memos, DO  Outpatient Primary MD for the patient is [name], [name], MD  LOS - 4   Chief Complaint  Patient presents with  . Weakness       Brief Narrative    85 year old BM PMHx Dementia, prostate cancer. S/P  XRT , CKD 3 follows with nephrology, HTN, HLD, diabetes type 2 uncontrolled with complication. Ambulatory dysfunction uses a cane at baseline  [facility]ed [facility] EDfrom homedue to generalized weakness with inability to ambulate. Patient was ambulating withwith a caneprior to this.Patient is unable to provide a history due to advanced dementia. History is mainly obtained from [name] and medical records. Per his daughter he was in his usual state of health prior to this was ambulating with a cane up until3 PM yesterday. Afterwards, wasno longer able to ambulate. They were concerned for a stroke and called EMS. No other associative symptoms. Patient denies a headache, change in vision, difficulty swallowing, chest pain or palpitations. On physical exam equal strength bilaterally in all 4 extremities.   ED Course:Vital signs notable for mildly elevated blood pressure. Lab studies remarkable for worsening creatinine with concern for prerenal azotemia. CT head unremarkable for any acute intracranial findings. MRI ordered by EDP and pending. UA unrevealing. TRH asked to admit to rule out CVA.    Subjective:    [facility] today denies any dyspnea, chest pain or cough, he is afebrile, report generalized weakness .   Assessment  & Plan :     Active Problems:   HTN (hypertension)   Generalized weakness   CKD (chronic kidney disease), stage III (HCC)   Benign essential HTN   Prostate cancer (HCC)   QT prolongation   Sinus bradycardia   Diabetes mellitus type 2, uncontrolled, with complications (HCC)    COVID 19 infection -Chest x-ray on 8/23 within normal limit -No hypoxia -No indication for Actemra, Remdesivir or plasma -Continue to trend inflammatory  markers closely, D-dimers trending down, CRP within normal limit, which is reassuring.  COVID-19 Labs  Recent Labs    10/11/18 0310 10/12/18 0235 10/13/18 0320  DDIMER 1.53* 1.06* 0.83*  FERRITIN 366* 381* 357*  CRP <0.8 <0.8 <0.8    Lab Results  Component Value Date   SARSCOV2NAA POSITIVE (A) 10/09/2018   Hypertension -Stopped beta-blockers given bradycardia, blood pressure controlled on Norvasc 10 mg oral daily.  Bradycardia -D/C  beta-blockers, improved, currently heart rate averaging in the 60s, symptomatic. -Previous MD discussed with cardiology, and Holter monitor on discharge.  Prolonged QTC -hold QT prolonging medication, monitor electrolytes closely, please potassium, magnesium within normal limit, will replete phosphorus.  Generalized weakness/deconditioning -Continue with PT, recommendation for home health  Hypophosphatemia -Improved, but still low at 1.9, will replete with Neutra-Phos  AKI on CKD stage III -Baseline creatinine 1.7, was 2.1 on admission, improved with IV fluids, will DC fluids today as like to be euvolemic.  Type 2 diabetes mellitus -Newly controlled, A1c is 8.7, I have increased his Lantus to 15 units twice daily, continue with insulin sliding scale, and 5 units before meals  Code Status : Full  Family Communication  : Discussed with son and daughter via phone today  Disposition Plan  : Home tomorrow  Consults  :  D/W cardiolgoy VIA phone  Procedures  : None  DVT Prophylaxis  : [place] heparin  Lab Results   Component Value Date   PLT 155 10/13/2018    Antibiotics  :    Anti-infectives (From admission, onward)   None        Objective:   Vitals:   10/12/18 1537 10/12/18 1921 10/13/18 0323 10/13/18 0720  BP: (!) 136/91 134/67 (!) 133/53 (!) 148/74  Pulse: 65 75 (!) 59 (!) 56  Resp: 16 17 19 16   Temp: 97.9 F (36.6 C) 97.7 F (36.5 C) 97.9 F (36.6 C) 98.1 F (36.7 C)  TempSrc: Oral Oral Oral Oral  SpO2: 99% 100% 99% 100%  Weight:   76.1 kg   Height:        Wt Readings from Last 3 Encounters:  10/13/18 76.1 kg  08/24/17 74 kg  10/04/14 80.1 kg     Intake/Output Summary (Last 24 hours) at 10/13/2018 1444 Last data filed at 10/13/2018 0911 Gross per 24 hour  Intake 1906.65 ml  Output 1695 ml  Net 211.65 ml     Physical Exam  Awake Alert, pleasant, normal affect, frail Symmetrical Chest wall movement, Good air movement bilaterally, CTAB RRR,No Gallops,Rubs or new Murmurs, No Parasternal Heave +ve B.Sounds, Abd Soft, No tenderness, No rebound - guarding or rigidity. No Cyanosis, Clubbing or edema, No new Rash or bruise       Data Review:    CBC Recent Labs  Lab 10/09/18 0026 10/10/18 0448 10/11/18 0310 10/12/18 0235 10/13/18 0320  WBC 6.4 3.9* 9.2 10.4 11.9*  HGB 15.9 14.8 14.5 14.2 13.5  HCT 48.2 45.0 43.6 43.0 40.6  PLT 162 152 162 167 155  MCV 93.1 91.8 91.6 91.9 90.8  MCH 30.7 30.2 30.5 30.3 30.2  MCHC 33.0 32.9 33.3 33.0 33.3  RDW 12.7 12.6 12.5 12.7 12.7  LYMPHSABS 1.3 0.8 1.2 0.9 1.4  MONOABS 0.7 0.1 0.3 0.4 0.6  EOSABS 0.0 0.0 0.0 0.0 0.0  BASOSABS 0.0 0.0 0.0 0.0 0.0    Chemistries  Recent Labs  Lab 10/09/18 0026 10/10/18 0448 10/10/18 0500 10/11/18 0310 10/12/18 0235 10/13/18 0320  NA 141 134* 135 134*  138 136  K 4.1 4.1 4.2 4.0 3.5 3.9  CL 101 99 97* 99 107 106  CO2 28 22 22  21* 22 22  GLUCOSE 113* 293* 290* 279* 263* 192*  BUN 32* 33* 33* 44* 47* 43*  CREATININE 2.16* 1.86* 1.89* 1.75* 1.81* 1.62*  CALCIUM 9.9 8.7* 8.9  8.3* 8.3* 7.9*  MG  --   --  1.7 1.8 1.9 1.8  AST 25  --  26 26 27 27   ALT 16  --  17 18 23 31   ALKPHOS 56  --  51 49 48 47  BILITOT 0.7  --  0.6 0.4 0.3 0.4   ------------------------------------------------------------------------------------------------------------------ Recent Labs    10/11/18 0310  CHOL 149  HDL 27*  LDLCALC 106*  TRIG 81  CHOLHDL 5.5    Lab Results  Component Value Date   HGBA1C 8.7 (H) 10/09/2018   ------------------------------------------------------------------------------------------------------------------ No results for input(s): TSH, T4TOTAL, T3FREE, THYROIDAB in the last 72 hours.  Invalid input(s): FREET3 ------------------------------------------------------------------------------------------------------------------ Recent Labs    10/12/18 0235 10/13/18 0320  FERRITIN 381* 357*    Coagulation profile No results for input(s): INR, PROTIME in the last 168 hours.  Recent Labs    10/12/18 0235 10/13/18 0320  DDIMER 1.06* 0.83*    Cardiac Enzymes No results for input(s): CKMB, TROPONINI, MYOGLOBIN in the last 168 hours.  Invalid input(s): CK ------------------------------------------------------------------------------------------------------------------ No results found for: BNP  Inpatient Medications  Scheduled Meds: . amLODipine  10 mg Oral Daily  . atorvastatin  40 mg Oral q1800  . cholecalciferol  1,000 Units Oral Daily  . heparin injection (subcutaneous)  5,000 Units Subcutaneous Q8H  . insulin aspart  0-20 Units Subcutaneous Q4H  . insulin aspart  6 Units Subcutaneous TID WC  . insulin glargine  22 Units Subcutaneous Daily  . phosphorus  500 mg Oral BID  . vitamin C  1,000 mg Oral Daily  . zinc sulfate  220 mg Oral Daily   Continuous Infusions:  PRN Meds:.acetaminophen, albuterol, ondansetron (ZOFRAN) IV, zolpidem  Micro Results Recent Results (from the past 240 hour(s))  SARS Coronavirus 2 [facility] order,  Performed in [facility] hospital lab) Nasopharyngeal Nasopharyngeal Swab     Status: Abnormal   Collection Time: 10/09/18  6:41 AM   Specimen: Nasopharyngeal Swab  Result Value Ref Range Status   SARS Coronavirus 2 POSITIVE (A) NEGATIVE Final    Comment: RESULT CALLED TO, READ BACK BY AND VERIFIED WITH: S CLAPP,RN 10/09/18 1228 RHOLMES (NOTE) If result is NEGATIVE SARS-CoV-2 target nucleic acids are NOT DETECTED. The SARS-CoV-2 RNA is generally detectable in upper and lower  respiratory specimens during the acute phase of infection. The lowest  concentration of SARS-CoV-2 viral copies this assay can detect is 250  copies / mL. A negative result does not preclude SARS-CoV-2 infection  and should not be used as the sole basis for treatment or other  patient management decisions.  A negative result may occur with  improper specimen collection / handling, submission of specimen other  than nasopharyngeal swab, presence of viral mutation(s) within the  areas targeted by this assay, and inadequate number of viral copies  (<250 copies / mL). A negative result must be combined with clinical  observations, patient history, and epidemiological information. If result is POSITIVE SARS-CoV-2 target nucleic acids are DETECTED. The S ARS-CoV-2 RNA is generally detectable in upper and lower  respiratory specimens during the acute phase of infection.  Positive  results are indicative of active infection with SARS-CoV-2.  Clinical  correlation with patient history and other diagnostic information is  necessary to determine patient infection status.  Positive results do  not rule out bacterial infection or co-infection with other viruses. If result is PRESUMPTIVE POSTIVE SARS-CoV-2 nucleic acids MAY BE PRESENT.   A presumptive positive result was obtained on the submitted specimen  and confirmed on repeat testing.  While 2019 novel coronavirus  (SARS-CoV-2) nucleic acids may be present in the  submitted sample  additional confirmatory testing may be necessary for epidemiological  and / or clinical management purposes  to differentiate between  SARS-CoV-2 and other Sarbecovirus currently known to infect humans.  If clinically indicated additional testing with an alternate test  methodology [phone]) is adv ised. The SARS-CoV-2 RNA is generally  detectable in upper and lower respiratory specimens during the acute  phase of infection. The expected result is Negative. Fact Sheet for Patients:  StrictlyIdeas.no Fact Sheet for Healthcare Providers: BankingDealers.co.za This test is not yet approved or cleared by the [place] FDA and has been authorized for detection and/or diagnosis of SARS-CoV-2 by FDA under an Emergency Use Authorization (EUA).  This EUA will remain in effect (meaning this test can be used) for the duration of the COVID-19 declaration under Section 564(b)(1) of the Act, 21 U.S.C. section 360bbb-3(b)(1), unless the authorization is terminated or revoked sooner. Performed at [facility], [place] [address] [place], [place] [address]     Radiology Reports Ct Head Wo Contrast  Result Date: 10/09/2018 CLINICAL DATA:  Patient with right-sided weakness. EXAM: CT HEAD WITHOUT CONTRAST TECHNIQUE: Contiguous axial images were obtained from the base of the skull through the vertex without intravenous contrast. COMPARISON:  Brain CT 09/17/2016 FINDINGS: Brain: Ventricles and sulci are prominent compatible with atrophy. Motion artifact limits evaluation. No evidence for acute cortically based infarct, intracranial hemorrhage, mass lesion or mass-effect. Vascular: Unremarkable Skull: Intact Sinuses/Orbits: Paranasal sinuses are well aerated. Mastoid air cells are unremarkable. Other: None. IMPRESSION: No acute intracranial process. Atrophy and chronic microvascular ischemic changes. Electronically Signed   By:  Lovey Newcomer M.D.   On: 10/09/2018 05:32   Dg Chest Port 1 View  Result Date: 10/09/2018 CLINICAL DATA:  Generalized weakness, dementia EXAM: PORTABLE CHEST 1 VIEW COMPARISON:  08/23/2017 FINDINGS: Lungs are clear.  No pleural effusion or pneumothorax. The heart is normal in size. IMPRESSION: No evidence of acute cardiopulmonary disease. Electronically Signed   By: Julian Hy M.D.   On: 10/09/2018 13:40    Phillips Climes M.D on 10/13/2018 at 2:44 PM  Between 7am to 7pm - Pager - [phone]  After 7pm go to [email or web address] - password [facility]  [facility] -  Office  [phone]

## 2018-10-14 LAB — CBC WITH DIFFERENTIAL/PLATELET
Abs Immature Granulocytes: 0.5 10*3/uL — ABNORMAL HIGH (ref 0.00–0.07)
Basophils Absolute: 0.1 10*3/uL (ref 0.0–0.1)
Basophils Relative: 1 %
Eosinophils Absolute: 0.1 10*3/uL (ref 0.0–0.5)
Eosinophils Relative: 1 %
HCT: 43.4 % (ref 39.0–52.0)
Hemoglobin: 14.5 g/dL (ref 13.0–17.0)
Immature Granulocytes: 5 %
Lymphocytes Relative: 20 %
Lymphs Abs: 2.2 10*3/uL (ref 0.7–4.0)
MCH: 30.5 pg (ref 26.0–34.0)
MCHC: 33.4 g/dL (ref 30.0–36.0)
MCV: 91.4 fL (ref 80.0–100.0)
Monocytes Absolute: 0.7 10*3/uL (ref 0.1–1.0)
Monocytes Relative: 7 %
Neutro Abs: 7.5 10*3/uL (ref 1.7–7.7)
Neutrophils Relative %: 66 %
Platelets: 144 10*3/uL — ABNORMAL LOW (ref 150–400)
RBC: 4.75 MIL/uL (ref 4.22–5.81)
RDW: 12.9 % (ref 11.5–15.5)
WBC: 11 10*3/uL — ABNORMAL HIGH (ref 4.0–10.5)
nRBC: 0.3 % — ABNORMAL HIGH (ref 0.0–0.2)

## 2018-10-14 LAB — COMPREHENSIVE METABOLIC PANEL
ALT: 85 U/L — ABNORMAL HIGH (ref 0–44)
AST: 60 U/L — ABNORMAL HIGH (ref 15–41)
Albumin: 3.2 g/dL — ABNORMAL LOW (ref 3.5–5.0)
Alkaline Phosphatase: 51 U/L (ref 38–126)
Anion gap: 8 (ref 5–15)
BUN: 42 mg/dL — ABNORMAL HIGH (ref 8–23)
CO2: 25 mmol/L (ref 22–32)
Calcium: 8.1 mg/dL — ABNORMAL LOW (ref 8.9–10.3)
Chloride: 108 mmol/L (ref 98–111)
Creatinine, Ser: 1.78 mg/dL — ABNORMAL HIGH (ref 0.61–1.24)
GFR calc Af Amer: 40 mL/min — ABNORMAL LOW (ref >60)
GFR calc non Af Amer: 35 mL/min — ABNORMAL LOW (ref >60)
Glucose, Bld: 168 mg/dL — ABNORMAL HIGH (ref 70–99)
Potassium: 3.2 mmol/L — ABNORMAL LOW (ref 3.5–5.1)
Sodium: 141 mmol/L (ref 135–145)
Total Bilirubin: 0.4 mg/dL (ref 0.3–1.2)
Total Protein: 6.2 g/dL — ABNORMAL LOW (ref 6.5–8.1)

## 2018-10-14 LAB — C-REACTIVE PROTEIN: CRP: <0.8 mg/dL (ref <1.0)

## 2018-10-14 LAB — GLUCOSE, CAPILLARY
Glucose-Capillary: 296 mg/dL — ABNORMAL HIGH (ref 70–99)
Glucose-Capillary: 277 mg/dL — ABNORMAL HIGH (ref 70–99)
Glucose-Capillary: 144 mg/dL — ABNORMAL HIGH (ref 70–99)
Glucose-Capillary: 67 mg/dL — ABNORMAL LOW (ref 70–99)
Glucose-Capillary: 239 mg/dL — ABNORMAL HIGH (ref 70–99)

## 2018-10-14 LAB — D-DIMER, QUANTITATIVE: D-Dimer, Quant: 0.87 ug/mL-FEU — ABNORMAL HIGH (ref 0.00–0.50)

## 2018-10-14 LAB — FERRITIN: Ferritin: 480 ng/mL — ABNORMAL HIGH (ref 24–336)

## 2018-10-14 LAB — MAGNESIUM: Magnesium: 1.8 mg/dL (ref 1.7–2.4)

## 2018-10-14 LAB — PHOSPHORUS: Phosphorus: 2.2 mg/dL — ABNORMAL LOW (ref 2.5–4.6)

## 2018-10-14 MED ORDER — DEXAMETHASONE 6 MG PO TABS: 6.0000 mg | ORAL_TABLET | Freq: Every day | ORAL | 0 refills | Status: AC

## 2018-10-14 MED ORDER — POTASSIUM CHLORIDE CRYS ER 20 MEQ PO TBCR
40.0000 meq | EXTENDED_RELEASE_TABLET | ORAL | Status: AC
  Administered 2018-10-14 (×2): 40 meq via ORAL
  Filled 2018-10-14 (×2): qty 2

## 2018-10-14 MED ORDER — AMLODIPINE BESYLATE 10 MG PO TABS: 10.0000 mg | ORAL_TABLET | Freq: Every day | ORAL | 0 refills | Status: AC

## 2018-10-14 MED ORDER — PANTOPRAZOLE SODIUM 40 MG PO TBEC: 40.0000 mg | DELAYED_RELEASE_TABLET | Freq: Every day | ORAL | Status: DC

## 2018-10-14 NOTE — Discharge Summary (Signed)
Tony Vasquez, is a 85 y.o. male  DOB 1932-03-27  MRN [id number].  Admission date:  10/08/2018  Admitting Physician  Kayleen Memos, DO  Discharge Date:  10/14/2018   Primary MD  [name], [name], MD  Recommendations for primary care physician for things to follow:  -Please check CBC, BMP and LFTs in 1 week, to ensure LFTs are trending down   Admission Diagnosis  Weakness [R53.1] Renal insufficiency [N28.9] COVID-19 virus detected [U07.1] Generalized weakness [R53.1]   Discharge Diagnosis  Weakness [R53.1] Renal insufficiency [N28.9] COVID-19 virus detected [U07.1] Generalized weakness [R53.1]    Active Problems:   HTN (hypertension)   Generalized weakness   CKD (chronic kidney disease), stage III (HCC)   Benign essential HTN   Prostate cancer (HCC)   QT prolongation   Sinus bradycardia   Diabetes mellitus type 2, uncontrolled, with complications ([place])      Past Medical History:  Diagnosis Date  . At risk for sleep apnea    STOP-BANG= 4    SENT TO PCP 11-16-2013  . Bilateral renal cysts   . Dyslipidemia   . Hydronephrosis of left kidney   . Hypertension   . Mild renal insufficiency   . OA (osteoarthritis)   . Prostate cancer ([place])   . Radiation 01/16/14-03/16/14   Prostate 78 gray in 40 fractions  . Type 2 diabetes mellitus ([place])     Past Surgical History:  Procedure Laterality Date  . CATARACT EXTRACTION W/ INTRAOCULAR LENS IMPLANT Left   . CYSTOSCOPY W/ RETROGRADES Left 11/21/2013   Procedure: LEFT CYSTOSCOPY WITH RETROGRADE PYELOGRAM;  Surgeon: Malka So, MD;  Location: [facility];  Service: Urology;  Laterality: Left;  . INGUINAL HERNIA REPAIR Left 2000  . PROSTATE BIOPSY N/A 11/21/2013   Procedure: BIOPSY TRANSRECTAL ULTRASONIC PROSTATE (TUBP);  Surgeon: Malka So, MD;  Location: [facility];  Service: Urology;  Laterality: N/A;  .  TONSILECTOMY/ADENOIDECTOMY WITH MYRINGOTOMY    . TONSILLECTOMY         History of present illness and  Hospital Course:     Kindly see H&P for history of present illness and admission details, please review complete Labs, Consult reports and Test reports for all details in brief  HPI  from the history and physical done on the day of admission 10/09/2018  HPI: Tony Vasquez is a 85 year old male with past medical history significant for dementia, prostate cancer post radiation, CKD 3 follows with nephrology, hypertension, hyperlipidemia, type 2 diabetes, ambulatory dysfunction uses a cane at baseline who presented to [facility] ED from home due to generalized weakness with inability to ambulate.  Patient was ambulating with with a cane prior to this.  Patient is unable to provide a history due to advanced dementia.  History is mainly obtained from his daughter Tony Vasquez via phone and medical records.  Per his daughter he was in his usual state of health prior to this was ambulating with a cane up until 3 PM yesterday.  Afterwards,  was no longer able to ambulate.  They were concerned for a stroke and called EMS.  No other associative symptoms.  Patient denies a headache, change in vision, difficulty swallowing, chest pain or palpitations.  On physical exam equal strength bilaterally in all 4 extremities.   ED Course: Vital signs notable for mildly elevated blood pressure.  Lab studies remarkable for worsening creatinine with concern for prerenal azotemia.  CT head unremarkable for any acute intracranial findings.  MRI ordered by EDP and pending.  UA unrevealing.  TRH asked to admit to rule out CVA.  Hospital Course     COVID 19 infection -Chest x-ray on 8/23 within normal limit -No hypoxia, no increased work of breathing, saturating well, no indication for Actemra, Remdesivir or plasma. -CRP within normal limit, D-dimers trending down, ferritin mildly elevated, he will be discharged on 7 days of  Decadron.  COVID-19 Labs  Recent Labs    10/12/18 0235 10/13/18 0320 10/14/18 0313  DDIMER 1.06* 0.83* 0.87*  FERRITIN 381* 357* 480*  CRP <0.8 <0.8 <0.8    Lab Results  Component Value Date   SARSCOV2NAA POSITIVE (A) 10/09/2018    Recent Labs       Lab Results  Component Value Date   SARSCOV2NAA POSITIVE (A) 10/09/2018     Hypertension -Stopped beta-blockers given bradycardia, blood pressure controlled on Norvasc 10 mg oral daily.  He will be discharged on Norvasc  Bradycardia -This is most likely in the setting of beta-blockers use, it was stopped, patient has been monitored on telemetry through entire hospital stay, currently no recurrence of bradycardia, resolved .   Prolonged QTC -  improved  on repeat EKG 8/25, at 496   Generalized weakness/deconditioning -Continue with PT, recommendation for home health  Hypophosphatemia/hypokalemia -repleted  AKI on CKD stage III -Baseline creatinine 1.7, was 2.1 on admission, resolved with IV fluids, back to baseline  Type 2 diabetes mellitus - A1c is 8.7, resume her home medication on discharge.  Transaminitis -The setting of COVID-19 infection, recheck LFTs in 1 week to ensure they are trending down  Discharge Condition: stable   Follow UP  Follow-up Information    Care, [facility] Follow up.   Specialty: [place] Why: A representative from [facility] will contact you to arrange start date and time for your therapy. Contact information: [address] [place] [address] [phone]        [name], [name], MD Follow up in 1 week(s).   Specialty: Internal Medicine Contact information: [address] [place] [address] [phone]             Discharge Instructions  and  Discharge Medications    Discharge Instructions    Diet - low sodium heart healthy   Complete by: As directed    Discharge instructions   Complete by: As directed     Follow with Primary MD [name], [name], MD in 7 days   Get CBC, CMP,  checked  by Primary MD next visit.    Activity: As tolerated with Full fall precautions use walker/cane & assistance as needed   Disposition Home    Diet: Heart Healthy ,carb modified, with feeding assistance and aspiration precautions.  For Heart failure patients - Check your Weight same time everyday, if you gain over 2 pounds, or you develop in leg swelling, experience more shortness of breath or chest pain, call your Primary MD immediately. Follow Cardiac Low Salt Diet and 1.5 lit/day  fluid restriction.   On your next visit with your primary care physician please Get Medicines reviewed and adjusted.   Please request your Prim.MD to go over all Hospital Tests and Procedure/Radiological results at the follow up, please get all Hospital records sent to your Prim MD by signing hospital release before you go home.   If you experience worsening of your admission symptoms, develop shortness of breath, life threatening emergency, suicidal or homicidal thoughts you must seek medical attention immediately by calling 911 or calling your MD immediately  if symptoms less severe.  You Must read complete instructions/literature along with all the possible adverse reactions/side effects for all the Medicines you take and that have been prescribed to you. Take any new Medicines after you have completely understood and accpet all the possible adverse reactions/side effects.   Do not drive, operating heavy machinery, perform activities at heights, swimming or participation in water activities or provide baby sitting services if your were admitted for syncope or siezures until you have seen by Primary MD or a Neurologist and advised to do so again.  Do not drive when taking Pain medications.    Do not take more than prescribed Pain, Sleep and Anxiety Medications  Special Instructions: If you have smoked or chewed Tobacco  in  the last 2 yrs please stop smoking, stop any regular Alcohol  and or any Recreational drug use.  Wear Seat belts while driving.   Please note  You were cared for by a hospitalist during your hospital stay. If you have any questions about your discharge medications or the care you received while you were in the hospital after you are discharged, you can call the unit and asked to speak with the hospitalist on call if the hospitalist that took care of you is not available. Once you are discharged, your primary care physician will handle any further medical issues. Please note that NO REFILLS for any discharge medications will be authorized once you are discharged, as it is imperative that you return to your primary care physician (or establish a relationship with a primary care physician if you do not have one) for your aftercare needs so that they can reassess your need for medications and monitor your lab values.   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 Was difficult to determine if it was do to Augmentin or both beta lactam agents.      Medication List    STOP taking these medications   furosemide 20 MG tablet Commonly known as: LASIX   metoprolol succinate 25 MG 24 hr tablet Commonly known as: TOPROL-XL     TAKE these medications   amLODipine 10 MG tablet Commonly known as: NORVASC  Take 1 tablet (10 mg total) by mouth daily. Start taking on: October 15, 2018   dexamethasone 6 MG tablet Commonly  known as: DECADRON Take 1 tablet (6 mg total) by mouth daily.   insulin glargine 100 UNIT/ML injection Commonly known as: LANTUS Inject 0.15 mLs (15 Units total) into the skin daily. What changed:   how much to take  when to take this   insulin lispro 100 UNIT/ML KiwkPen Commonly known as: HumaLOG KwikPen Inject 0.03 mLs (3 Units total) into the skin 2 (two) times daily with breakfast and lunch. What changed:   how much to take  when to take this  additional instructions         Diet and Activity recommendation: See Discharge Instructions above   Consults obtained - None   Major procedures and Radiology Reports - PLEASE review detailed and final reports for all details, in brief -     Ct Head Wo Contrast  Result Date: 10/09/2018 CLINICAL DATA:  Patient with right-sided weakness. EXAM: CT HEAD WITHOUT CONTRAST TECHNIQUE: Contiguous axial images were obtained from the base of the skull through the vertex without intravenous contrast. COMPARISON:  Brain CT 09/17/2016 FINDINGS: Brain: Ventricles and sulci are prominent compatible with atrophy. Motion artifact limits evaluation. No evidence for acute cortically based infarct, intracranial hemorrhage, mass lesion or mass-effect. Vascular: Unremarkable Skull: Intact Sinuses/Orbits: Paranasal sinuses are well aerated. Mastoid air cells are unremarkable. Other: None. IMPRESSION: No acute intracranial process. Atrophy and chronic microvascular ischemic changes. Electronically Signed   By: Lovey Newcomer M.D.   On: 10/09/2018 05:32   Dg Chest Port 1 View  Result Date: 10/09/2018 CLINICAL DATA:  Generalized weakness, dementia EXAM: PORTABLE CHEST 1 VIEW COMPARISON:  08/23/2017 FINDINGS: Lungs are clear.  No pleural effusion or pneumothorax. The heart is normal in size. IMPRESSION: No evidence of acute cardiopulmonary  disease. Electronically Signed   By: Julian Hy M.D.   On: 10/09/2018 13:40    Micro Results     Recent Results (from the past 240 hour(s))  SARS Coronavirus 2 [facility] order, Performed in [facility] hospital lab) Nasopharyngeal Nasopharyngeal Swab     Status: Abnormal   Collection Time: 10/09/18  6:41 AM   Specimen: Nasopharyngeal Swab  Result Value Ref Range Status   SARS Coronavirus 2 POSITIVE (A) NEGATIVE Final    Comment: RESULT CALLED TO, READ BACK BY AND VERIFIED WITH: S CLAPP,RN 10/09/18 1228 RHOLMES (NOTE) If result is NEGATIVE SARS-CoV-2 target nucleic acids are NOT DETECTED. The SARS-CoV-2 RNA is generally detectable in upper and lower  respiratory specimens during the acute phase of infection. The lowest  concentration of SARS-CoV-2 viral copies this assay can detect is 250  copies / mL. A negative result does not preclude SARS-CoV-2 infection  and should not be used as the sole basis for treatment or other  patient management decisions.  A negative result may occur with  improper specimen collection / handling, submission of specimen other  than nasopharyngeal swab, presence of viral mutation(s) within the  areas targeted by this assay, and inadequate number of viral copies  (<250 copies / mL). A negative result must be combined with clinical  observations, patient history, and epidemiological information. If result is POSITIVE SARS-CoV-2 target nucleic acids are DETECTED. The S ARS-CoV-2 RNA is generally detectable in upper and lower  respiratory specimens during the acute phase of infection.  Positive  results are indicative of active infection with SARS-CoV-2.  Clinical  correlation with patient history and other diagnostic information is  necessary to determine patient infection status.  Positive results do  not rule out bacterial infection or  co-infection with other viruses. If result is PRESUMPTIVE POSTIVE SARS-CoV-2 nucleic acids MAY BE PRESENT.   A  presumptive positive result was obtained on the submitted specimen  and confirmed on repeat testing.  While 2019 novel coronavirus  (SARS-CoV-2) nucleic acids may be present in the submitted sample  additional confirmatory testing may be necessary for epidemiological  and / or clinical management purposes  to differentiate between  SARS-CoV-2 and other Sarbecovirus currently known to infect humans.  If clinically indicated additional testing with an alternate test  methodology [phone]) is adv ised. The SARS-CoV-2 RNA is generally  detectable in upper and lower respiratory specimens during the acute  phase of infection. The expected result is Negative. Fact Sheet for Patients:  StrictlyIdeas.no Fact Sheet for Healthcare Providers: BankingDealers.co.za This test is not yet approved or cleared by the [place] FDA and has been authorized for detection and/or diagnosis of SARS-CoV-2 by FDA under an Emergency Use Authorization (EUA).  This EUA will remain in effect (meaning this test can be used) for the duration of the COVID-19 declaration under Section 564(b)(1) of the Act, 21 U.S.C. section 360bbb-3(b)(1), unless the authorization is terminated or revoked sooner. Performed at [facility], [place] [address], [place], [place] [address]        Today   Subjective:   Encarnacion Degruy today denies any complaints, no nausea, no vomiting, no shortness of breath or chest pain .  Objective:   Blood pressure (!) 147/77, pulse 81, temperature 98 F (36.7 C), temperature source Oral, resp. rate 19, height 5\' 8"  (1.727 m), weight 76.1 kg, SpO2 98 %.   Intake/Output Summary (Last 24 hours) at 10/14/2018 1026 Last data filed at 10/14/2018 1001 Gross per 24 hour  Intake 840 ml  Output 300 ml  Net 540 ml    Exam Awake Alert, pleasant,frail Symmetrical Chest wall movement, Good air movement bilaterally, CTAB RRR,No  Gallops,Rubs or new Murmurs, No Parasternal Heave +ve B.Sounds, Abd Soft, Non tender, No organomegaly appriciated, No rebound -guarding or rigidity. No Cyanosis, Clubbing or edema, No new Rash or bruise  Data Review   CBC w Diff:  Lab Results  Component Value Date   WBC 11.0 (H) 10/14/2018   HGB 14.5 10/14/2018   HCT 43.4 10/14/2018   PLT 144 (L) 10/14/2018   LYMPHOPCT 20 10/14/2018   MONOPCT 7 10/14/2018   EOSPCT 1 10/14/2018   BASOPCT 1 10/14/2018    CMP:  Lab Results  Component Value Date   NA 141 10/14/2018   K 3.2 (L) 10/14/2018   CL 108 10/14/2018   CO2 25 10/14/2018   BUN 42 (H) 10/14/2018   CREATININE 1.78 (H) 10/14/2018   PROT 6.2 (L) 10/14/2018   ALBUMIN 3.2 (L) 10/14/2018   BILITOT 0.4 10/14/2018   ALKPHOS 51 10/14/2018   AST 60 (H) 10/14/2018   ALT 85 (H) 10/14/2018  .   Total Time in preparing paper work, data evaluation and todays exam - 52 minutes  Phillips Climes M.D on 10/14/2018 at [place]  [phone]

## 2018-10-14 NOTE — Progress Notes (Signed)
Occupational Therapy Treatment Patient Details Name: Tony Vasquez MRN: [id number] DOB: [birth year] Today's Date: 10/14/2018    History of present illness 85 year old male with past medical history significant for dementia, prostate cancer post radiation, CKD 3 follows with nephrology, hypertension, hyperlipidemia, type 2 diabetes, ambulatory dysfunction uses a cane at baseline who presented to [facility] ED from home due to generalized weakness with inability to ambulate. CT - for CVA. Covid +.   OT comments  Making steady progress toward Goals. Improved activity tolerance for ADL and mobility on RA but do not feel pt has returned to baeline and will benefit from [facility].   Follow Up Recommendations  Home health OT;Supervision/Assistance - 24 hour    Equipment Recommendations  None recommended by OT    Recommendations for Other Services      Precautions / Restrictions Precautions Precautions: Fall       Mobility Bed Mobility Overal bed mobility: Modified Independent                Transfers Overall transfer level: Needs assistance Equipment used: Rolling walker (2 wheeled) Transfers: Sit to/from Stand Sit to Stand: Supervision Stand pivot transfers: Min guard            Balance     Sitting balance-Leahy Scale: Good       Standing balance-Leahy Scale: Fair                             ADL either performed or assessed with clinical judgement   ADL Overall ADL's : Needs assistance/impaired                                     Functional mobility during ADLs: Min guard;Rolling walker General ADL Comments: Completed toielt trnafser and ADL at sink level with minguard A. Improved endurance     Vision       Perception     Praxis      Cognition Arousal/Alertness: Awake/alert Behavior During Therapy: WFL for tasks assessed/performed Overall Cognitive Status: No family/caregiver present to determine baseline cognitive  functioning Area of Impairment: Orientation;Safety/judgement;Awareness;Problem solving                 Orientation Level: Disoriented to;Time Current Attention Level: Sustained Memory: Decreased short-term memory   Safety/Judgement: Decreased awareness of safety;Decreased awareness of deficits Awareness: Emergent Problem Solving: Slow processing;Requires verbal cues General Comments: most likely close to baseline; able to state today that he had "that virus"        Exercises Other Exercises Other Exercises: dancing to music to increased activity tolerance and address BUE strengthening   Shoulder Instructions       General Comments      Pertinent Vitals/ Pain       Pain Assessment: Faces Faces Pain Scale: No hurt  Home Living                                          Prior Functioning/Environment              Frequency  Min 2X/week        Progress Toward Goals  OT Goals(current goals can now be found in the care plan section)  Progress towards OT goals: Progressing toward goals  Acute Rehab OT  Goals Patient Stated Goal: Per family  - to return home OT Goal Formulation: With patient/family Time For Goal Achievement: 10/24/18 Potential to Achieve Goals: Good ADL Goals Pt Will Perform Lower Body Bathing: with modified independence;sit to/from stand Pt Will Transfer to Toilet: with modified independence;ambulating;regular height toilet Pt Will Perform Toileting - Clothing Manipulation and hygiene: with modified independence;sit to/from stand  Plan Discharge plan remains appropriate    Co-evaluation                 AM-PAC OT "6 Clicks" Daily Activity     Outcome Measure   Help from another person eating meals?: None Help from another person taking care of personal grooming?: A Little Help from another person toileting, which includes using toliet, bedpan, or urinal?: A Little Help from another person bathing (including  washing, rinsing, drying)?: A Little Help from another person to put on and taking off regular upper body clothing?: A Little Help from another person to put on and taking off regular lower body clothing?: A Little 6 Click Score: 19    End of Session Equipment Utilized During Treatment: Rolling walker  OT Visit Diagnosis: Unsteadiness on feet (R26.81);Muscle weakness (generalized) (M62.81);Other symptoms and signs involving cognitive function   Activity Tolerance Patient tolerated treatment well   Patient Left in chair;in CPM;with chair alarm set   Nurse Communication Mobility status        Time: [id number] OT Time Calculation (min): 33 min  Charges: OT General Charges $OT Visit: 1 Visit OT Treatments $Self Care/Home Management : 23-37 mins  Maurie Boettcher, OT/L   Acute OT Clinical Specialist [place] Pager [phone] Office [phone]    [facility] 10/14/2018, 2:46 PM

## 2018-10-14 NOTE — Discharge Instructions (Signed)
Person Under Monitoring Name: Tony Vasquez  Location: [place] [address]   Infection Prevention Recommendations for Individuals Confirmed to have, or Being Evaluated for, 2019 Novel Coronavirus (COVID-19) Infection Who Receive Care at Home  Individuals who are confirmed to have, or are being evaluated for, COVID-19 should follow the prevention steps below until a healthcare provider or local or state health department says they can return to normal activities.  Stay home except to get medical care You should restrict activities outside your home, except for getting medical care. Do not go to work, school, or public areas, and do not use public transportation or taxis.  Call ahead before visiting your doctor Before your medical appointment, call the healthcare provider and tell them that you have, or are being evaluated for, COVID-19 infection. This will help the healthcare providers office take steps to keep other people from getting infected. Ask your healthcare provider to call the local or state health department.  Monitor your symptoms Seek prompt medical attention if your illness is worsening (e.g., difficulty breathing). Before going to your medical appointment, call the healthcare provider and tell them that you have, or are being evaluated for, COVID-19 infection. Ask your healthcare provider to call the local or state health department.  Wear a facemask You should wear a facemask that covers your nose and mouth when you are in the same room with other people and when you visit a healthcare provider. People who live with or visit you should also wear a facemask while they are in the same room with you.  Separate yourself from other people in your home As much as possible, you should stay in a different room from other people in your home. Also, you should use a separate bathroom, if available.  Avoid sharing household items You should not share  dishes, drinking glasses, cups, eating utensils, towels, bedding, or other items with other people in your home. After using these items, you should wash them thoroughly with soap and water.  Cover your coughs and sneezes Cover your mouth and nose with a tissue when you cough or sneeze, or you can cough or sneeze into your sleeve. Throw used tissues in a lined trash can, and immediately wash your hands with soap and water for at least 20 seconds or use an alcohol-based hand rub.  Wash your [facility] your hands often and thoroughly with soap and water for at least 20 seconds. You can use an alcohol-based hand sanitizer if soap and water are not available and if your hands are not visibly dirty. Avoid touching your eyes, nose, and mouth with unwashed hands.   Prevention Steps for Caregivers and Household Members of Individuals Confirmed to have, or Being Evaluated for, COVID-19 Infection Being Cared for in the Home  If you live with, or provide care at home for, a person confirmed to have, or being evaluated for, COVID-19 infection please follow these guidelines to prevent infection:  Follow healthcare providers instructions Make sure that you understand and can help the patient follow any healthcare provider instructions for all care.  Provide for the patients basic needs You should help the patient with basic needs in the home and provide support for getting groceries, prescriptions, and other personal needs.  Monitor the patients symptoms If they are getting sicker, call his or her medical provider and tell them that the patient has, or is being evaluated for, COVID-19 infection. This will help the healthcare providers office  take steps to keep other people from getting infected. Ask the healthcare provider to call the local or state health department.  Limit the number of people who have contact with the patient  If possible, have only one caregiver for the patient.  Other  household members should stay in another home or place of residence. If this is not possible, they should stay  in another room, or be separated from the patient as much as possible. Use a separate bathroom, if available.  Restrict visitors who do not have an essential need to be in the home.  Keep older adults, very young children, and other sick people away from the patient Keep older adults, very young children, and those who have compromised immune systems or chronic health conditions away from the patient. This includes people with chronic heart, lung, or kidney conditions, diabetes, and cancer.  Ensure good ventilation Make sure that shared spaces in the home have good air flow, such as from an air conditioner or an opened window, weather permitting.  Wash your hands often  Wash your hands often and thoroughly with soap and water for at least 20 seconds. You can use an alcohol based hand sanitizer if soap and water are not available and if your hands are not visibly dirty.  Avoid touching your eyes, nose, and mouth with unwashed hands.  Use disposable paper towels to dry your hands. If not available, use dedicated cloth towels and replace them when they become wet.  Wear a facemask and gloves  Wear a disposable facemask at all times in the room and gloves when you touch or have contact with the patients blood, body fluids, and/or secretions or excretions, such as sweat, saliva, sputum, nasal mucus, vomit, urine, or feces.  Ensure the mask fits over your nose and mouth tightly, and do not touch it during use.  Throw out disposable facemasks and gloves after using them. Do not reuse.  Wash your hands immediately after removing your facemask and gloves.  If your personal clothing becomes contaminated, carefully remove clothing and launder. Wash your hands after handling contaminated clothing.  Place all used disposable facemasks, gloves, and other waste in a lined container before  disposing them with other household waste.  Remove gloves and wash your hands immediately after handling these items.  Do not share dishes, glasses, or other household items with the patient  Avoid sharing household items. You should not share dishes, drinking glasses, cups, eating utensils, towels, bedding, or other items with a patient who is confirmed to have, or being evaluated for, COVID-19 infection.  After the person uses these items, you should wash them thoroughly with soap and water.  Wash laundry thoroughly  Immediately remove and wash clothes or bedding that have blood, body fluids, and/or secretions or excretions, such as sweat, saliva, sputum, nasal mucus, vomit, urine, or feces, on them.  Wear gloves when handling laundry from the patient.  Read and follow directions on labels of laundry or clothing items and detergent. In general, wash and dry with the warmest temperatures recommended on the label.  Clean all areas the individual has used often  Clean all touchable surfaces, such as counters, tabletops, doorknobs, bathroom fixtures, toilets, phones, keyboards, tablets, and bedside tables, every day. Also, clean any surfaces that may have blood, body fluids, and/or secretions or excretions on them.  Wear gloves when cleaning surfaces the patient has come in contact with.  Use a diluted bleach solution (e.g., dilute bleach with 1 part  bleach and 10 parts water) or a household disinfectant with a label that says EPA-registered for coronaviruses. To make a bleach solution at home, add 1 tablespoon of bleach to 1 quart (4 cups) of water. For a larger supply, add  cup of bleach to 1 gallon (16 cups) of water.  Read labels of cleaning products and follow recommendations provided on product labels. Labels contain instructions for safe and effective use of the cleaning product including precautions you should take when applying the product, such as wearing gloves or eye protection  and making sure you have good ventilation during use of the product.  Remove gloves and wash hands immediately after cleaning.  Monitor yourself for signs and symptoms of illness Caregivers and household members are considered close contacts, should monitor their health, and will be asked to limit movement outside of the home to the extent possible. Follow the monitoring steps for close contacts listed on the symptom monitoring form.   ? If you have additional questions, contact your local health department or call the epidemiologist on call at [phone] (available 24/7). ? This guidance is subject to change. For the most up-to-date guidance from [facility], please refer to their website: [email or web address]

## 2018-10-17 ENCOUNTER — Other Ambulatory Visit: Payer: Self-pay

## 2018-10-17 ENCOUNTER — Emergency Department (HOSPITAL_COMMUNITY): Payer: Medicare Other

## 2018-10-17 ENCOUNTER — Encounter (HOSPITAL_COMMUNITY): Payer: Self-pay

## 2018-10-17 ENCOUNTER — Inpatient Hospital Stay (HOSPITAL_COMMUNITY)
Admission: EM | Admit: 2018-10-17 | Discharge: 2018-11-17 | DRG: 871 | Disposition: E | Payer: Medicare Other | Attending: Internal Medicine | Admitting: Internal Medicine

## 2018-10-17 DIAGNOSIS — Z880 Allergy status to penicillin: Secondary | ICD-10-CM | POA: Diagnosis not present

## 2018-10-17 DIAGNOSIS — J189 Pneumonia, unspecified organism: Secondary | ICD-10-CM

## 2018-10-17 DIAGNOSIS — M1990 Unspecified osteoarthritis, unspecified site: Secondary | ICD-10-CM | POA: Diagnosis present

## 2018-10-17 DIAGNOSIS — J069 Acute upper respiratory infection, unspecified: Secondary | ICD-10-CM | POA: Diagnosis not present

## 2018-10-17 DIAGNOSIS — B964 Proteus (mirabilis) (morganii) as the cause of diseases classified elsewhere: Secondary | ICD-10-CM | POA: Diagnosis present

## 2018-10-17 DIAGNOSIS — F0390 Unspecified dementia without behavioral disturbance: Secondary | ICD-10-CM | POA: Diagnosis present

## 2018-10-17 DIAGNOSIS — I472 Ventricular tachycardia: Secondary | ICD-10-CM | POA: Diagnosis present

## 2018-10-17 DIAGNOSIS — A419 Sepsis, unspecified organism: Secondary | ICD-10-CM

## 2018-10-17 DIAGNOSIS — I248 Other forms of acute ischemic heart disease: Secondary | ICD-10-CM | POA: Diagnosis present

## 2018-10-17 DIAGNOSIS — Z8546 Personal history of malignant neoplasm of prostate: Secondary | ICD-10-CM

## 2018-10-17 DIAGNOSIS — E1122 Type 2 diabetes mellitus with diabetic chronic kidney disease: Secondary | ICD-10-CM | POA: Diagnosis present

## 2018-10-17 DIAGNOSIS — E872 Acidosis: Secondary | ICD-10-CM | POA: Diagnosis present

## 2018-10-17 DIAGNOSIS — Z794 Long term (current) use of insulin: Secondary | ICD-10-CM

## 2018-10-17 DIAGNOSIS — N390 Urinary tract infection, site not specified: Secondary | ICD-10-CM | POA: Diagnosis present

## 2018-10-17 DIAGNOSIS — J9601 Acute respiratory failure with hypoxia: Secondary | ICD-10-CM | POA: Diagnosis present

## 2018-10-17 DIAGNOSIS — Z833 Family history of diabetes mellitus: Secondary | ICD-10-CM

## 2018-10-17 DIAGNOSIS — R531 Weakness: Secondary | ICD-10-CM | POA: Diagnosis present

## 2018-10-17 DIAGNOSIS — Z66 Do not resuscitate: Secondary | ICD-10-CM | POA: Diagnosis not present

## 2018-10-17 DIAGNOSIS — I129 Hypertensive chronic kidney disease with stage 1 through stage 4 chronic kidney disease, or unspecified chronic kidney disease: Secondary | ICD-10-CM | POA: Diagnosis present

## 2018-10-17 DIAGNOSIS — A4189 Other specified sepsis: Principal | ICD-10-CM | POA: Diagnosis present

## 2018-10-17 DIAGNOSIS — Z8042 Family history of malignant neoplasm of prostate: Secondary | ICD-10-CM | POA: Diagnosis not present

## 2018-10-17 DIAGNOSIS — J1289 Other viral pneumonia: Secondary | ICD-10-CM | POA: Diagnosis present

## 2018-10-17 DIAGNOSIS — I10 Essential (primary) hypertension: Secondary | ICD-10-CM | POA: Diagnosis not present

## 2018-10-17 DIAGNOSIS — E1165 Type 2 diabetes mellitus with hyperglycemia: Secondary | ICD-10-CM | POA: Diagnosis present

## 2018-10-17 DIAGNOSIS — U071 COVID-19: Secondary | ICD-10-CM

## 2018-10-17 DIAGNOSIS — I469 Cardiac arrest, cause unspecified: Secondary | ICD-10-CM | POA: Diagnosis not present

## 2018-10-17 DIAGNOSIS — N189 Chronic kidney disease, unspecified: Secondary | ICD-10-CM | POA: Diagnosis present

## 2018-10-17 DIAGNOSIS — N183 Chronic kidney disease, stage 3 (moderate): Secondary | ICD-10-CM | POA: Diagnosis present

## 2018-10-17 DIAGNOSIS — E785 Hyperlipidemia, unspecified: Secondary | ICD-10-CM | POA: Diagnosis present

## 2018-10-17 DIAGNOSIS — Z7952 Long term (current) use of systemic steroids: Secondary | ICD-10-CM

## 2018-10-17 DIAGNOSIS — Z8249 Family history of ischemic heart disease and other diseases of the circulatory system: Secondary | ICD-10-CM | POA: Diagnosis not present

## 2018-10-17 DIAGNOSIS — R0600 Dyspnea, unspecified: Secondary | ICD-10-CM

## 2018-10-17 DIAGNOSIS — Z91013 Allergy to seafood: Secondary | ICD-10-CM

## 2018-10-17 DIAGNOSIS — IMO0002 Reserved for concepts with insufficient information to code with codable children: Secondary | ICD-10-CM | POA: Diagnosis present

## 2018-10-17 DIAGNOSIS — N179 Acute kidney failure, unspecified: Secondary | ICD-10-CM | POA: Diagnosis present

## 2018-10-17 DIAGNOSIS — Z79899 Other long term (current) drug therapy: Secondary | ICD-10-CM

## 2018-10-17 LAB — URINALYSIS, ROUTINE W REFLEX MICROSCOPIC
Bacteria, UA: NONE SEEN
Bilirubin Urine: NEGATIVE
Glucose, UA: >=500 mg/dL — AB
Ketones, ur: NEGATIVE mg/dL
Leukocytes,Ua: NEGATIVE
Nitrite: NEGATIVE
Protein, ur: 30 mg/dL — AB
Specific Gravity, Urine: 1.013 (ref 1.005–1.030)
pH: 5 (ref 5.0–8.0)

## 2018-10-17 LAB — LACTIC ACID, PLASMA
Lactic Acid, Venous: 2.5 mmol/L (ref 0.5–1.9)
Lactic Acid, Venous: 3.4 mmol/L (ref 0.5–1.9)
Lactic Acid, Venous: 1.1 mmol/L (ref 0.5–1.9)

## 2018-10-17 LAB — PROCALCITONIN: Procalcitonin: 0.16 ng/mL

## 2018-10-17 LAB — COMPREHENSIVE METABOLIC PANEL
ALT: 47 U/L — ABNORMAL HIGH (ref 0–44)
AST: 26 U/L (ref 15–41)
Albumin: 4 g/dL (ref 3.5–5.0)
Alkaline Phosphatase: 49 U/L (ref 38–126)
Anion gap: 12 (ref 5–15)
BUN: 48 mg/dL — ABNORMAL HIGH (ref 8–23)
CO2: 21 mmol/L — ABNORMAL LOW (ref 22–32)
Calcium: 9.2 mg/dL (ref 8.9–10.3)
Chloride: 101 mmol/L (ref 98–111)
Creatinine, Ser: 1.92 mg/dL — ABNORMAL HIGH (ref 0.61–1.24)
GFR calc Af Amer: 36 mL/min — ABNORMAL LOW (ref >60)
GFR calc non Af Amer: 31 mL/min — ABNORMAL LOW (ref >60)
Glucose, Bld: 318 mg/dL — ABNORMAL HIGH (ref 70–99)
Potassium: 5 mmol/L (ref 3.5–5.1)
Sodium: 134 mmol/L — ABNORMAL LOW (ref 135–145)
Total Bilirubin: 0.9 mg/dL (ref 0.3–1.2)
Total Protein: 8.4 g/dL — ABNORMAL HIGH (ref 6.5–8.1)

## 2018-10-17 LAB — TRIGLYCERIDES: Triglycerides: 106 mg/dL (ref <150)

## 2018-10-17 LAB — CBC WITH DIFFERENTIAL/PLATELET
Abs Immature Granulocytes: 0.23 10*3/uL — ABNORMAL HIGH (ref 0.00–0.07)
Basophils Absolute: 0 10*3/uL (ref 0.0–0.1)
Basophils Relative: 0 %
Eosinophils Absolute: 0 10*3/uL (ref 0.0–0.5)
Eosinophils Relative: 0 %
HCT: 43.5 % (ref 39.0–52.0)
Hemoglobin: 14.5 g/dL (ref 13.0–17.0)
Immature Granulocytes: 3 %
Lymphocytes Relative: 4 %
Lymphs Abs: 0.4 10*3/uL — ABNORMAL LOW (ref 0.7–4.0)
MCH: 30.6 pg (ref 26.0–34.0)
MCHC: 33.3 g/dL (ref 30.0–36.0)
MCV: 91.8 fL (ref 80.0–100.0)
Monocytes Absolute: 0.6 10*3/uL (ref 0.1–1.0)
Monocytes Relative: 6 %
Neutro Abs: 7.6 10*3/uL (ref 1.7–7.7)
Neutrophils Relative %: 87 %
Platelets: 170 10*3/uL (ref 150–400)
RBC: 4.74 MIL/uL (ref 4.22–5.81)
RDW: 13.3 % (ref 11.5–15.5)
WBC: 8.7 10*3/uL (ref 4.0–10.5)
nRBC: 0 % (ref 0.0–0.2)

## 2018-10-17 LAB — LACTATE DEHYDROGENASE: LDH: 240 U/L — ABNORMAL HIGH (ref 98–192)

## 2018-10-17 LAB — CBG MONITORING, ED
Glucose-Capillary: 317 mg/dL — ABNORMAL HIGH (ref 70–99)
Glucose-Capillary: 103 mg/dL — ABNORMAL HIGH (ref 70–99)

## 2018-10-17 LAB — GLUCOSE, CAPILLARY
Glucose-Capillary: 100 mg/dL — ABNORMAL HIGH (ref 70–99)
Glucose-Capillary: 138 mg/dL — ABNORMAL HIGH (ref 70–99)

## 2018-10-17 LAB — SARS CORONAVIRUS 2 BY RT PCR (HOSPITAL ORDER, PERFORMED IN ~~LOC~~ HOSPITAL LAB): SARS Coronavirus 2: POSITIVE — AB

## 2018-10-17 LAB — C-REACTIVE PROTEIN: CRP: 10.4 mg/dL — ABNORMAL HIGH (ref <1.0)

## 2018-10-17 LAB — FERRITIN: Ferritin: 660 ng/mL — ABNORMAL HIGH (ref 24–336)

## 2018-10-17 MED ORDER — VANCOMYCIN HCL IN DEXTROSE 750-5 MG/150ML-% IV SOLN
750.0000 mg | INTRAVENOUS | Status: DC
  Administered 2018-10-18 – 2018-10-19 (×2): 750 mg via INTRAVENOUS
  Filled 2018-10-17 (×2): qty 150

## 2018-10-17 MED ORDER — ENOXAPARIN SODIUM 40 MG/0.4ML ~~LOC~~ SOLN
40.0000 mg | SUBCUTANEOUS | Status: DC
  Administered 2018-10-17: 17:00:00 40 mg via SUBCUTANEOUS
  Filled 2018-10-17: qty 0.4

## 2018-10-17 MED ORDER — ACETAMINOPHEN 325 MG PO TABS
650.0000 mg | ORAL_TABLET | Freq: Once | ORAL | Status: AC
  Administered 2018-10-17: 650 mg via ORAL
  Filled 2018-10-17: qty 2

## 2018-10-17 MED ORDER — METHYLPREDNISOLONE SODIUM SUCC 125 MG IJ SOLR
60.0000 mg | Freq: Two times a day (BID) | INTRAMUSCULAR | Status: DC
  Administered 2018-10-17 – 2018-10-18 (×3): 60 mg via INTRAVENOUS
  Filled 2018-10-17 (×4): qty 2

## 2018-10-17 MED ORDER — INSULIN ASPART 100 UNIT/ML ~~LOC~~ SOLN
0.0000 [IU] | Freq: Three times a day (TID) | SUBCUTANEOUS | Status: DC
  Administered 2018-10-18: 9 [IU] via SUBCUTANEOUS
  Administered 2018-10-18: 5 [IU] via SUBCUTANEOUS
  Administered 2018-10-18: 10:00:00 1 [IU] via SUBCUTANEOUS
  Administered 2018-10-19: 3 [IU] via SUBCUTANEOUS
  Administered 2018-10-19: 9 [IU] via SUBCUTANEOUS
  Filled 2018-10-17: qty 0.09

## 2018-10-17 MED ORDER — HEPARIN SODIUM (PORCINE) 5000 UNIT/ML IJ SOLN
5000.0000 [IU] | Freq: Three times a day (TID) | INTRAMUSCULAR | Status: DC
  Administered 2018-10-18 – 2018-10-22 (×13): 5000 [IU] via SUBCUTANEOUS
  Filled 2018-10-17 (×13): qty 1

## 2018-10-17 MED ORDER — SODIUM CHLORIDE 0.9 % IV SOLN
1000.0000 mL | INTRAVENOUS | Status: DC
  Administered 2018-10-17 – 2018-10-18 (×2): 1000 mL via INTRAVENOUS

## 2018-10-17 MED ORDER — VANCOMYCIN HCL IN DEXTROSE 1-5 GM/200ML-% IV SOLN
1000.0000 mg | Freq: Once | INTRAVENOUS | Status: DC
  Filled 2018-10-17: qty 200

## 2018-10-17 MED ORDER — SODIUM CHLORIDE 0.9 % IV SOLN
2.0000 g | Freq: Once | INTRAVENOUS | Status: AC
  Administered 2018-10-17: 2 g via INTRAVENOUS
  Filled 2018-10-17: qty 2

## 2018-10-17 MED ORDER — SODIUM CHLORIDE 0.9 % IV SOLN
2.0000 g | INTRAVENOUS | Status: DC
  Administered 2018-10-17 – 2018-10-20 (×4): 2 g via INTRAVENOUS
  Filled 2018-10-17 (×4): qty 2

## 2018-10-17 MED ORDER — VANCOMYCIN HCL 10 G IV SOLR
1500.0000 mg | Freq: Once | INTRAVENOUS | Status: AC
  Administered 2018-10-17: 07:00:00 1500 mg via INTRAVENOUS
  Filled 2018-10-17: qty 1500

## 2018-10-17 MED ORDER — ENOXAPARIN SODIUM 30 MG/0.3ML ~~LOC~~ SOLN: 30.0000 mg | SUBCUTANEOUS | Status: DC

## 2018-10-17 MED ORDER — AMLODIPINE BESYLATE 5 MG PO TABS
10.0000 mg | ORAL_TABLET | Freq: Every day | ORAL | Status: DC
  Administered 2018-10-17 – 2018-10-20 (×4): 10 mg via ORAL
  Filled 2018-10-17 (×4): qty 2

## 2018-10-17 MED ORDER — INSULIN ASPART 100 UNIT/ML ~~LOC~~ SOLN
0.0000 [IU] | Freq: Every day | SUBCUTANEOUS | Status: DC
  Administered 2018-10-18: 4 [IU] via SUBCUTANEOUS
  Administered 2018-10-19: 2 [IU] via SUBCUTANEOUS
  Administered 2018-10-20: 21:00:00 3 [IU] via SUBCUTANEOUS
  Administered 2018-10-21: 2 [IU] via SUBCUTANEOUS
  Filled 2018-10-17: qty 0.05

## 2018-10-17 NOTE — ED Notes (Addendum)
Date and time results received: 09/19/2018 5:31 AM (use smartphrase ".now" to insert current time)  Test: Lactic acid Critical Value: 2.5  Name of Provider Notified: Jarrett Soho PA Orders Received? Or Actions Taken?:

## 2018-10-17 NOTE — Progress Notes (Signed)
Pharmacy Antibiotic Note  Tony Vasquez is a 85 y.o. male with COVID recently treated at [facility] from 8/22-8/28, presented to the ED on 09/26/2018 with c/o generalized weakness and urinary incontinence. CXR on 8/31 showed "findings compatible with multifocal pneumonia."  To start vancomycin and cefepime  for PNA.  - scr 1.92 (crcl~28) - of note, pt tolerated ceftriaxone in the past - received vancomycin 1500 mg and aztreonam 2gm Iv x1 in the ED ~ 0700 on 8/31  Plan: - vancomycin 750 mg IV q24h for est AUC 492 - cefepime 2gm IV q24h - f/u renal function  _____________________________________________  Temp (24hrs), Avg:101.9 F (38.8 C), Min:101.6 F (38.7 C), Max:102.2 F (39 C)  Recent Labs  Lab 10/11/18 0310 10/12/18 0235 10/13/18 0320 10/14/18 0313 09/26/2018 0411 10/16/2018 0608  WBC 9.2 10.4 11.9* 11.0* 8.7  --   CREATININE 1.75* 1.81* 1.62* 1.78* 1.92*  --   LATICACIDVEN  --   --   --   --  2.5* 3.4*    Estimated Creatinine Clearance: 28.2 mL/min (A) (by C-G formula based on SCr of 1.92 mg/dL (H)).    Allergies  Allergen Reactions  . Shrimp [Shellfish Allergy] Anaphylaxis  . 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 Was difficult to determine if it was do to Augmentin or both beta lactam agents.    Thank you for allowing pharmacy to be a part of this patient's care.  Lynelle Doctor 10/12/2018 11:29 AM

## 2018-10-17 NOTE — ED Notes (Signed)
Vince from Lab reports that patient has a + COVID swab. Dr. Kathrynn Humble notified at this time regarding results.

## 2018-10-17 NOTE — ED Notes (Addendum)
Unable to obtain second set of cultures due to difficult IV access.

## 2018-10-17 NOTE — ED Provider Notes (Addendum)
[facility] DEPT Provider Note   CSN: [id number] Arrival date & time: 09/28/2018  [id number]     History   Chief Complaint Chief Complaint  Patient presents with  . Covid +  . Fatigue    HPI Tony Vasquez is a 85 y.o. male with a hx of dementia, renal insufficiency, prostate cancer, diabetes presents to the Emergency Department complaining of gradual, persistent, progressively worsening " feeling bad."  Patient is unable to provide a history..  Level 5 caveat for altered mental status/dementia.  Patient history obtained from chart and both sons in the [place]  Patient had a similar presentation on 10/09/2018 with nonfocal weakness and difficulty walking.  At that time CT scan was without acute process.  Patient's [id number] screening came back positive.  Chest x-ray was without consolidations at that time.  He was afebrile at that time and transferred to [facility].  Patient did well at [facility] and was discharged on 10/14/2018 to home.  His inflammatory markers including CRP and d-dimer had trended down.  He was discharged home without oxygen.  Family reports that he did well for several days and then worsened this afternoon and evening.  They report it became clear that he did not feel well, his confusion increased significantly and he was having difficulty ambulating.  This prompted their call to EMS and his return here to the emergency department.     The history is provided by a relative and medical records. No language interpreter was used.    Past Medical History:  Diagnosis Date  . At risk for sleep apnea    STOP-BANG= 4    SENT TO PCP 11-16-2013  . Bilateral renal cysts   . Dyslipidemia   . Hydronephrosis of left kidney   . Hypertension   . Mild renal insufficiency   . OA (osteoarthritis)   . Prostate cancer ([place])   . Radiation 01/16/14-03/16/14   Prostate 78 gray in 40 fractions  . Type 2 diabetes mellitus [facility])     Patient Active Problem  List   Diagnosis Date Noted  . QT prolongation 10/11/2018  . Sinus bradycardia 10/11/2018  . Diabetes mellitus type 2, uncontrolled, with complications ([place]) [id number]  . CKD (chronic kidney disease), stage III ([place]) 10/10/2018  . Benign essential HTN 10/10/2018  . Prostate cancer ([place]) 10/10/2018  . Generalized weakness 10/09/2018  . Hypoglycemia 08/23/2017  . Bradycardia   . Malignant neoplasm of prostate ([place]) 01/16/2014  . Secondary renovascular hypertension, benign 12/12/2012  . Chronic kidney disease, unspecified 12/12/2012  . Type II or unspecified type diabetes mellitus with renal manifestations, not stated as uncontrolled(250.40) 12/12/2012  . Osteoarthrosis, unspecified whether generalized or localized, unspecified site 11/24/2012  . Acute bronchitis 11/24/2012  . Fever 10/31/2012  . Rash 10/31/2012  . Intractable hiccups 10/31/2012  . Renal insufficiency, mild 10/31/2012  . Thrombocytopenia ([place]) 10/31/2012  . Normocytic anemia 10/31/2012  . DM (diabetes mellitus) ([place]) 10/31/2012  . HTN (hypertension) 10/31/2012  . Dyslipidemia 10/31/2012    Past Surgical History:  Procedure Laterality Date  . CATARACT EXTRACTION W/ INTRAOCULAR LENS IMPLANT Left   . CYSTOSCOPY W/ RETROGRADES Left 11/21/2013   Procedure: LEFT CYSTOSCOPY WITH RETROGRADE PYELOGRAM;  Surgeon: Malka So, MD;  Location[facility];  Service: Urology;  Laterality: Left;  . INGUINAL HERNIA REPAIR Left 2000  . PROSTATE BIOPSY N/A 11/21/2013   Procedure: BIOPSY TRANSRECTAL ULTRASONIC PROSTATE (TUBP);  Surgeon: Malka So, MD;  Location: [facility]  [place];  Service: Urology;  Laterality: N/A;  . TONSILECTOMY/ADENOIDECTOMY WITH MYRINGOTOMY    . TONSILLECTOMY          Home Medications    Prior to Admission medications   Medication Sig Start Date End Date Taking? Authorizing Provider  amLODipine (NORVASC) 10 MG tablet Take 1 tablet (10 mg total) by mouth daily. 10/15/18  Yes  Elgergawy, Silver Huguenin, MD  dexamethasone (DECADRON) 6 MG tablet Take 1 tablet (6 mg total) by mouth daily. 10/14/18  Yes Elgergawy, Silver Huguenin, MD  insulin glargine (LANTUS) 100 UNIT/ML injection Inject 0.15 mLs (15 Units total) into the skin daily. 08/25/17  Yes Reyne Dumas, MD  insulin lispro (HUMALOG KWIKPEN) 100 UNIT/ML KiwkPen Inject 0.03 mLs (3 Units total) into the skin 2 (two) times daily with breakfast and lunch. Patient taking differently: Inject 3 Units into the skin 2 (two) times daily.  08/25/17  Yes Reyne Dumas, MD    Family History Family History  Problem Relation Age of Onset  . Prostate cancer Father   . Diabetes Father   . Heart disease Father   . Hypertension Father   . Hypertension Mother   . Crohn's disease Mother   . Heart disease Mother   . Crohn's disease Sister   . Heart disease Sister   . Hypertension Sister   . Diabetes Sister     Social History Social History   Tobacco Use  . Smoking status: Never Smoker  . Smokeless tobacco: Never Used  Substance Use Topics  . Alcohol use: No  . Drug use: No     Allergies   Shrimp [shellfish allergy] and Augmentin [amoxicillin-pot clavulanate]   Review of Systems Review of Systems  Unable to perform ROS: Dementia     Physical Exam Updated Vital Signs BP (!) 151/80 (BP Location: Left Arm)   Pulse 92   Temp (!) 102.2 F (39 C) (Rectal)   Resp (!) 24   SpO2 97%   Physical Exam Vitals signs and nursing note reviewed.  Constitutional:      General: He is not in acute distress.    Appearance: He is not diaphoretic.     Comments: Alert.  Patient answers yes/no questions.  HENT:     Head: Normocephalic.  Eyes:     General: No scleral icterus.    Conjunctiva/sclera: Conjunctivae normal.  Neck:     Musculoskeletal: Normal range of motion.  Cardiovascular:     Rate and Rhythm: Regular rhythm. Tachycardia present.     Pulses: Normal pulses.          Radial pulses are 2+ on the right side and 2+ on  the left side.  Pulmonary:     Effort: Tachypnea and accessory muscle usage present. No prolonged expiration, respiratory distress or retractions.     Breath sounds: No stridor.     Comments: Equal chest rise. Moderate increased work of breathing. Abdominal:     General: There is no distension.     Palpations: Abdomen is soft.     Tenderness: There is no abdominal tenderness. There is no guarding or rebound.  Musculoskeletal:     Right lower leg: Edema present.     Left lower leg: Edema present.     Comments: Moves all extremities equally and without difficulty. Bilateral pitting edema to the lower legs and forefeet  Skin:    General: Skin is warm and dry.     Capillary Refill: Capillary refill takes less than 2 seconds.  Comments: Hot to touch  Neurological:     Mental Status: He is alert.     GCS: GCS eye subscore is 4. GCS verbal subscore is 5. GCS motor subscore is 6.     Comments: Speech is clear and goal oriented.  Psychiatric:        Mood and Affect: Mood normal.      ED Treatments / Results  Labs (all labs ordered are listed, but only abnormal results are displayed) Labs Reviewed  LACTIC ACID, PLASMA - Abnormal; Notable for the following components:      Result Value   Lactic Acid, Venous 2.5 (*)    All other components within normal limits  LACTIC ACID, PLASMA - Abnormal; Notable for the following components:   Lactic Acid, Venous 3.4 (*)    All other components within normal limits  COMPREHENSIVE METABOLIC PANEL - Abnormal; Notable for the following components:   Sodium 134 (*)    CO2 21 (*)    Glucose, Bld 318 (*)    BUN 48 (*)    Creatinine, Ser 1.92 (*)    Total Protein 8.4 (*)    ALT 47 (*)    GFR calc non Af Amer 31 (*)    GFR calc Af Amer 36 (*)    All other components within normal limits  CBC WITH DIFFERENTIAL/PLATELET - Abnormal; Notable for the following components:   Lymphs Abs 0.4 (*)    Abs Immature Granulocytes 0.23 (*)    All other  components within normal limits  URINALYSIS, ROUTINE W REFLEX MICROSCOPIC - Abnormal; Notable for the following components:   Color, Urine STRAW (*)    Glucose, UA >=500 (*)    Hgb urine dipstick MODERATE (*)    Protein, ur 30 (*)    All other components within normal limits  LACTATE DEHYDROGENASE - Abnormal; Notable for the following components:   LDH 240 (*)    All other components within normal limits  FERRITIN - Abnormal; Notable for the following components:   Ferritin 660 (*)    All other components within normal limits  C-REACTIVE PROTEIN - Abnormal; Notable for the following components:   CRP 10.4 (*)    All other components within normal limits  CBG MONITORING, ED - Abnormal; Notable for the following components:   Glucose-Capillary 317 (*)    All other components within normal limits  CULTURE, BLOOD (ROUTINE X 2)  CULTURE, BLOOD (ROUTINE X 2)  URINE CULTURE  SARS CORONAVIRUS 2 (HOSPITAL ORDER, [facility])  PROCALCITONIN  TRIGLYCERIDES  D-DIMER, QUANTITATIVE (NOT AT [facility])  FIBRINOGEN  PROTIME-INR  APTT    EKG EKG Interpretation  Date/Time:  Monday October 17 2018 04:06:22 EDT Ventricular Rate:  95 PR Interval:    QRS Duration: 105 QT Interval:  390 QTC Calculation: 491 R Axis:   -63 Text Interpretation:  Sinus rhythm Ventricular premature complex Probable left atrial enlargement Incomplete RBBB and LAFB Abnormal R-wave progression, early transition Abnrm T, consider ischemia, anterolateral lds No significant change since last tracing Confirmed by Orpah Greek [phone]) on 10/09/2018 7:15:31 AM     Radiology Ct Head Wo Contrast  Result Date: 09/17/2018 CLINICAL DATA:  Unexplained altered level consciousness. EXAM: CT HEAD WITHOUT CONTRAST TECHNIQUE: Contiguous axial images were obtained from the base of the skull through the vertex without intravenous contrast. COMPARISON:  10/09/2018 FINDINGS: Brain: No evidence of acute  infarction, hemorrhage, hydrocephalus, extra-axial collection, or mass lesion/mass effect. Moderate diffuse cerebral atrophy  and chronic small vessel disease again demonstrated. Vascular:  No hyperdense vessel or other acute findings. Skull: No evidence of fracture or other significant bone abnormality. Sinuses/Orbits:  No acute findings. Other: None. IMPRESSION: No acute intracranial abnormality. Stable cerebral atrophy and chronic small vessel disease. Electronically Signed   By: Marlaine Hind M.D.   On: 10/09/2018 06:45   Dg Chest Port 1 View  Result Date: 10/16/2018 CLINICAL DATA:  COVID-19, fever EXAM: PORTABLE CHEST 1 VIEW COMPARISON:  Radiograph October 09, 2018 FINDINGS: Patchy consolidation silhouettes portion of the right hemidiaphragm with additional opacity in the left lung periphery. No pneumothorax or effusion. Cardiomediastinal contours are unremarkable for the portable technique. No acute osseous or soft tissue abnormality. Degenerative changes are present in the imaged spine and shoulders. IMPRESSION: Findings compatible with multifocal pneumonia and patient's known diagnosis of COVID-19 Electronically Signed   By: Lovena Le M.D.   On: 09/28/2018 05:55    Procedures .Critical Care Performed by: Abigail Butts, PA-C Authorized by: Abigail Butts, PA-C   Critical care provider statement:    Critical care time (minutes):  45   Critical care time was exclusive of:  Separately billable procedures and treating other patients and teaching time   Critical care was necessary to treat or prevent imminent or life-threatening deterioration of the following conditions:  Sepsis   Critical care was time spent personally by me on the following activities:  Discussions with consultants, evaluation of patient's response to treatment, examination of patient, ordering and performing treatments and interventions, ordering and review of laboratory studies, ordering and review of radiographic  studies, pulse oximetry, re-evaluation of patient's condition, obtaining history from patient or surrogate and review of old charts   I assumed direction of critical care for this patient from another provider in my specialty: no     (including critical care time)  Medications Ordered in ED Medications  0.9 %  sodium chloride infusion (1,000 mLs Intravenous New Bag/Given 09/22/2018 0444)  vancomycin (VANCOCIN) 1,500 mg in sodium chloride 0.9 % 500 mL IVPB (1,500 mg Intravenous New Bag/Given 09/25/2018 0714)  acetaminophen (TYLENOL) tablet 650 mg (650 mg Oral Given 09/19/2018 0603)  aztreonam (AZACTAM) 2 g in sodium chloride 0.9 % 100 mL IVPB (0 g Intravenous Stopped 09/24/2018 0713)     Initial Impression / Assessment and Plan / ED Course  I have reviewed the triage vital signs and the nursing notes.  Pertinent labs & imaging results that were available during my care of the patient were reviewed by me and considered in my medical decision making (see chart for details).  Clinical Course as of Oct 17 730  Mon Oct 17, 2018  0541 Spoke with Marguerite Olea, pt's son - Father was d/c 8/28 and reports pt was unstable gait, but was eating well and seemed to be OK until today.  Pt's other son Antravious was with him today and made the call to transport back to the hospital.       [HM]  0544 Slightly elevated from baseline  Creatinine(!): 1.92 [HM]  0545 elevated  Lactic Acid, Venous(!!): 2.5 [HM]  0546 [id number] - Marios.  Son reports his father was getting better until this evening when he started complaining of general pain and "rocking in the chair." He reports his father had become very weak as throughout the night and had increased urination throughout the night. He denies focal weakness.  He reports his father had been walking on his own, but was unable to get up to  walk tonight.  Son reports more disorientation tonight, unable to find the bathroom in his own home.    [HM]  [phone] Discussed with Dr. Jonelle Sidle  who will admit   [HM]  [phone] No acute abnormality.  CT Head Wo Contrast [HM]  0731 Worsening lactic acid.  Remains without hypotension.    Lactic Acid, Venous(!!): 3.4 [HM]    Clinical Course User Index [HM] Tabathia Knoche, [place], [place] was evaluated in Emergency Department on 10/16/2018 for the symptoms described in the history of present illness. He was evaluated in the context of the global COVID-19 pandemic, which necessitated consideration that the patient might be at risk for infection with the SARS-CoV-2 virus that causes COVID-19. Institutional protocols and algorithms that pertain to the evaluation of patients at risk for COVID-19 are in a state of rapid change based on information released by regulatory bodies including the CDC and federal and state organizations. These policies and algorithms were followed during the patient's care in the ED.   Patient with a recent diagnosis of COVID here febrile and tachypneic.  Increased lactic acid, LDH, CRP.  Mild AKI with creatinine elevated from baseline.  Chest x-ray now with evidence of multifocal pneumonia.  Code sepsis initiated.  Will start on antibiotics.  Unclear if patient's symptoms are secondary to COVID or now from multifocal HCAP.  Sodium chloride infusion started.  No hypotension at this time, will not give large bolus.  Patient's lactic is elevated.   7:13 AM Head CT without acute abnormality.  Suspect patient's increased confusion is secondary to his sepsis and fever.  Discussed with tried hospitalist who will admit.  COVID pending.    The patient was discussed with Dr. Betsey Holiday who agrees with the treatment plan.  7:41 AM Pt with rising lactic acid.  Treating for COVID vs HCAP PNA, but with known COVID will hold on 11mL/kg bolus.   The patient is noted to have a lactate<4. With the current information available to me, I don't think the patient is in septic shock. The lactate rising is likely related to  COVID.    Final Clinical Impressions(s) / ED Diagnoses   Final diagnoses:  COVID-19 virus infection  Healthcare-associated pneumonia  Sepsis without acute organ dysfunction, due to unspecified organism [facility])    ED Discharge Orders    None       Agapito Games 10/11/2018 [id number]    Orpah Greek, MD 09/17/2018 [phone]

## 2018-10-17 NOTE — Progress Notes (Signed)
A consult was received from an ED physician for Vancomycin, aztreonam per pharmacy dosing.  The patient's profile has been reviewed for ht/wt/allergies/indication/available labs.   A one time order has been placed for Vancomycin 1.5gm iv x1, then Aztreonam 2gm iv x1.  Further antibiotics/pharmacy consults should be ordered by admitting physician if indicated.                       Thank you, Nani Skillern Crowford 10/13/2018  6:28 AM

## 2018-10-17 NOTE — ED Notes (Signed)
Patient transported to CT 

## 2018-10-17 NOTE — Progress Notes (Signed)
Notified provider of need to draw lactic acid.  Secure chat sent to admitting MD requesting repeat lactic drawn since previous was greater than 2 and was still climbing.

## 2018-10-17 NOTE — Progress Notes (Signed)
Call made to pt's daughter [name]. Updated of pt condition and plan of care. Pt's daughter appreciative of update.

## 2018-10-17 NOTE — Progress Notes (Addendum)
Inpatient Diabetes Program Recommendations  AACE/ADA: New Consensus Statement on Inpatient Glycemic Control (2015)  Target Ranges:  Prepandial:   less than 140 mg/dL      Peak postprandial:   less than 180 mg/dL (1-2 hours)      Critically ill patients:  140 - 180 mg/dL   Results for TIERNAN, SAADE (MRN [id number]) as of 09/18/2018 07:34  Ref. Range 09/22/2018 04:07  Glucose-Capillary Latest Ref Range: 70 - 99 mg/dL 317 (H)   Results for ALDUS, LOPERA (MRN [id number]) as of 09/28/2018 07:34  Ref. Range 10/09/2018 00:29  Hemoglobin A1C Latest Ref Range: 4.8 - 5.6 % 8.7 (H)     Admit with: COVID/ Pneumonia/ Sepsis  History: DM, Prostate Cancer, Dementia  Home DM Meds: Lantus 15 units Daily       Humalog 3 units BID with Breakfast and Lunch  Current Orders: None yet      Just discharged home on 08/28 after hospitalization for COVID/ AKI.  Admitting MD to see pt this AM for admission.    --Will follow patient during hospitalization--  Wyn Quaker RN, MSN, CDE Diabetes Coordinator Inpatient Glycemic Control Team Team Pager: [phone] (8a-5p)

## 2018-10-17 NOTE — ED Triage Notes (Signed)
Per ems: Pt coming from home c/o progressive generalized weakness and urinary incontinence. Pt dx with Covid + and d/c from [place] recently. Pt cbg was 640 earlier and son gave him "20 units of insulin". Alert to person at baseline  Temp-100.4 Hr- 96 rr-20 cbg-368 146/94 O2- 97% room air   18 L AC

## 2018-10-17 NOTE — H&P (Signed)
History and Physical    Tony Vasquez [id number] DOB: July 30, 1932 DOA: 10/13/2018  PCP: Haywood Pao, MD  Patient coming from:   I have personally briefly reviewed patient's old medical records in [place]  Chief Complaint: Generalized weakness and fatigue since 2 days  HPI: Tony Vasquez is a 85 y.o. male with medical history significant of hypertension, type 2 diabetes, osteoarthritis,  Dementia recently diagnosed COVID-19 illness discharged home on 10/14/2018 was brought in for generalized weakness and fatigue, poor oral intake, low-grade fevers and difficulty walking.  Patient is a poor historian and most of the history was obtained from the EDP notes and his daughter on the phone.  No fevers or chills, no sob, or cough, no vomiting or diarrhea, no syncope, .  ED Course: febrile, tachypnea, hypertensive 151/80 mmhg, labs significant for 134, creatinine of 1.92, BUN of 48, bicarb of 21, ferritin 660, CRP 10.4, lactic acid 3.4, pro calcitonin 0.16.   He was referred to medical service for admission.   Review of Systems: cannot be obtained due to  confusion.   Past Medical History:  Diagnosis Date  . At risk for sleep apnea    STOP-BANG= 4    SENT TO PCP 11-16-2013  . Bilateral renal cysts   . Dyslipidemia   . Hydronephrosis of left kidney   . Hypertension   . Mild renal insufficiency   . OA (osteoarthritis)   . Prostate cancer ([place])   . Radiation 01/16/14-03/16/14   Prostate 78 gray in 40 fractions  . Type 2 diabetes mellitus ([place])     Past Surgical History:  Procedure Laterality Date  . CATARACT EXTRACTION W/ INTRAOCULAR LENS IMPLANT Left   . CYSTOSCOPY W/ RETROGRADES Left 11/21/2013   Procedure: LEFT CYSTOSCOPY WITH RETROGRADE PYELOGRAM;  Surgeon: Malka So, MD;  Location: [facility];  Service: Urology;  Laterality: Left;  . INGUINAL HERNIA REPAIR Left 2000  . PROSTATE BIOPSY N/A 11/21/2013   Procedure: BIOPSY TRANSRECTAL ULTRASONIC  PROSTATE (TUBP);  Surgeon: Malka So, MD;  Location: [facility];  Service: Urology;  Laterality: N/A;  . TONSILECTOMY/ADENOIDECTOMY WITH MYRINGOTOMY    . TONSILLECTOMY     Social history.   reports that he has never smoked. He has never used smokeless tobacco. He reports that he does not drink alcohol or use drugs.  Allergies  Allergen Reactions  . Shrimp [Shellfish Allergy] Anaphylaxis  . 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 Was difficult to determine if it was do to Augmentin or both beta lactam agents.    Family History  Problem Relation Age of Onset  . Prostate cancer Father   . Diabetes Father   . Heart disease Father   . Hypertension Father   . Hypertension Mother   . Crohn's disease Mother   . Heart disease Mother   . Crohn's disease Sister   . Heart disease Sister   . Hypertension Sister   . Diabetes Sister    Family history cannot be reviewed.   Prior to Admission medications   Medication Sig Start Date End Date Taking? Authorizing Provider  amLODipine (NORVASC) 10 MG tablet Take 1 tablet (10 mg total) by mouth daily. 10/15/18  Yes Elgergawy, Silver Huguenin, MD  dexamethasone (DECADRON) 6 MG tablet Take 1 tablet (6 mg total) by mouth daily. 10/14/18  Yes Elgergawy, Silver Huguenin,  MD  insulin glargine (LANTUS) 100 UNIT/ML injection Inject 0.15 mLs (15 Units total) into the skin daily. 08/25/17  Yes Reyne Dumas, MD  insulin lispro (HUMALOG KWIKPEN) 100 UNIT/ML KiwkPen Inject 0.03 mLs (3 Units total) into the skin 2 (two) times daily with breakfast and lunch. Patient taking differently: Inject 3 Units into the skin 2 (two) times daily.  08/25/17  Yes Reyne Dumas, MD    Physical Exam: Vitals:   10/12/2018 0730 10/14/2018 0800 09/24/2018 0830 10/02/2018 0900  BP: 127/64 125/66 136/69 (!) 141/82  Pulse: 70 63 (!)  59 60  Resp: 17 15 16 12   Temp:      TempSrc:      SpO2: 94% 96% 97% 94%    Constitutional: NAD, calm, comfortable Vitals:   09/28/2018 0730 09/30/2018 0800 09/20/2018 0830 10/09/2018 0900  BP: 127/64 125/66 136/69 (!) 141/82  Pulse: 70 63 (!) 59 60  Resp: 17 15 16 12   Temp:      TempSrc:      SpO2: 94% 96% 97% 94%   Eyes: PERRL, lids and conjunctivae normal ENMT: Mucous membranes are dry.  Neck: normal, supple,  Respiratory: diminished at bases, no wheezing heard. Scattered rhonchi.  Cardiovascular: Regular rate and rhythm, S1S2.  Abdomen: abd is soft , non tender  Bowel sounds good.  Musculoskeletal: no clubbing / cyanosis. No joint deformity upper and lower extremities.  Skin: no rashes,  Neurologic: CN 2-12 grossly intact. Alert, but confused.    Labs on Admission: I have personally reviewed following labs and imaging studies  CBC: Recent Labs  Lab 10/11/18 0310 10/12/18 0235 10/13/18 0320 10/14/18 0313 09/25/2018 0411  WBC 9.2 10.4 11.9* 11.0* 8.7  NEUTROABS 7.6 9.0* 9.7* 7.5 7.6  HGB 14.5 14.2 13.5 14.5 14.5  HCT 43.6 43.0 40.6 43.4 43.5  MCV 91.6 91.9 90.8 91.4 91.8  PLT 162 167 155 144* [id number]   Basic Metabolic Panel: Recent Labs  Lab 10/11/18 0310 10/12/18 0235 10/13/18 0320 10/14/18 0313 10/10/2018 0411  NA 134* 138 136 141 134*  K 4.0 3.5 3.9 3.2* 5.0  CL 99 107 106 108 101  CO2 21* 22 22 25  21*  GLUCOSE 279* 263* 192* 168* 318*  BUN 44* 47* 43* 42* 48*  CREATININE 1.75* 1.81* 1.62* 1.78* 1.92*  CALCIUM 8.3* 8.3* 7.9* 8.1* 9.2  MG 1.8 1.9 1.8 1.8  --   PHOS 1.7* 1.0* 1.9* 2.2*  --    GFR: Estimated Creatinine Clearance: 28.2 mL/min (A) (by C-G formula based on SCr of 1.92 mg/dL (H)). Liver Function Tests: Recent Labs  Lab 10/11/18 0310 10/12/18 0235 10/13/18 0320 10/14/18 0313 10/09/2018 0411  AST 26 27 27  60* 26  ALT 18 23 31  85* 47*  ALKPHOS 49 48 47 51 49  BILITOT 0.4 0.3 0.4 0.4 0.9  PROT 6.9 6.9 6.2* 6.2* 8.4*  ALBUMIN 3.3* 3.3* 3.3* 3.2*  4.0   No results for input(s): LIPASE, AMYLASE in the last 168 hours. No results for input(s): AMMONIA in the last 168 hours. Coagulation Profile: No results for input(s): INR, PROTIME in the last 168 hours. Cardiac Enzymes: No results for input(s): CKTOTAL, CKMB, CKMBINDEX, TROPONINI in the last 168 hours. BNP (last 3 results) No results for input(s): PROBNP in the last 8760 hours. HbA1C: No results for input(s): HGBA1C in the last 72 hours. CBG: Recent Labs  Lab 10/13/18 2325 10/14/18 0358 10/14/18 0713 10/14/18 1117 10/14/2018 0407  GLUCAP 277* 144* 67* 239* 317*   Lipid Profile: Recent  Labs    10/16/2018 0412  TRIG 106   Thyroid Function Tests: No results for input(s): TSH, T4TOTAL, FREET4, T3FREE, THYROIDAB in the last 72 hours. Anemia Panel: Recent Labs    10/11/2018 0412  FERRITIN 660*   Urine analysis:    Component Value Date/Time   COLORURINE STRAW (A) 10/08/2018 0411   APPEARANCEUR CLEAR 09/27/2018 0411   LABSPEC 1.013 10/08/2018 0411   PHURINE 5.0 09/26/2018 0411   GLUCOSEU >=500 (A) 09/25/2018 0411   HGBUR MODERATE (A) 10/08/2018 0411   BILIRUBINUR NEGATIVE 09/24/2018 0411   KETONESUR NEGATIVE 10/05/2018 0411   PROTEINUR 30 (A) 09/26/2018 0411   UROBILINOGEN 0.2 10/31/2012 0109   NITRITE NEGATIVE 10/15/2018 0411   LEUKOCYTESUR NEGATIVE 10/11/2018 0411    Radiological Exams on Admission: Ct Head Wo Contrast  Result Date: 10/04/2018 CLINICAL DATA:  Unexplained altered level consciousness. EXAM: CT HEAD WITHOUT CONTRAST TECHNIQUE: Contiguous axial images were obtained from the base of the skull through the vertex without intravenous contrast. COMPARISON:  10/09/2018 FINDINGS: Brain: No evidence of acute infarction, hemorrhage, hydrocephalus, extra-axial collection, or mass lesion/mass effect. Moderate diffuse cerebral atrophy and chronic small vessel disease again demonstrated. Vascular:  No hyperdense vessel or other acute findings. Skull: No evidence of  fracture or other significant bone abnormality. Sinuses/Orbits:  No acute findings. Other: None. IMPRESSION: No acute intracranial abnormality. Stable cerebral atrophy and chronic small vessel disease. Electronically Signed   By: Marlaine Hind M.D.   On: 09/27/2018 06:45   Dg Chest Port 1 View  Result Date: 09/28/2018 CLINICAL DATA:  COVID-19, fever EXAM: PORTABLE CHEST 1 VIEW COMPARISON:  Radiograph October 09, 2018 FINDINGS: Patchy consolidation silhouettes portion of the right hemidiaphragm with additional opacity in the left lung periphery. No pneumothorax or effusion. Cardiomediastinal contours are unremarkable for the portable technique. No acute osseous or soft tissue abnormality. Degenerative changes are present in the imaged spine and shoulders. IMPRESSION: Findings compatible with multifocal pneumonia and patient's known diagnosis of COVID-19 Electronically Signed   By: Lovena Le M.D.   On: 09/23/2018 05:55    EKG: Independently reviewed. Sinus rhythm, diffuse t wave inversions.   Assessment/Plan Active Problems:   COVID-19 virus infection   FEVER, tachypnea, elevated lactate, mild metabolic acidosis,  no hypoxia, bilateral pneumonia on CXR,  Admit for sepsis secondary to bilateral pneumonia from COVID 19 illness.  Started the patient on IV fluids , Broad spectrum IV antibiotics. Blood cultures done and pending.  Follow the inflammatory markers, start the patient on IV solumedrol 60 mg Every 12 hours.  Transfer the patient to [facility] for further management. Discussed with the patient's daughter over the phone.    Diabetes mellitus:  CBG (last 3)  Recent Labs    10/14/18 1117 09/28/2018 0407  GLUCAP 239* 317*   Resume SSI.    Dementia:  No agitation.  Stable.     Hypertension:  Well controlled.   Stage 3 to 4 CKD:  Creatinine appears to be at baseline.      DVT prophylaxis: lovenox.  Code Status: full code.  Family Communication:discussed with daughter  over the phone.  Disposition Plan: transfer to [place] called: none.  Admission status: med surg/ inpatient.    Hosie Poisson MD [facility] Pager [phone]   If 7PM-7AM, please contact night-coverage [email or web address] Password [facility]  10/05/2018, 10:55 AM

## 2018-10-17 NOTE — Progress Notes (Signed)
Notified provider of need to order fluid bolus.  Secure chat sent to provider asking to consider fluids with rising lactic acid. Patient is covid +, provider will consider, currently patient isn't hypotensive. Elink will continue to monitor.

## 2018-10-17 NOTE — Progress Notes (Signed)
Pt has arrived to unit via stretcher accompanied by [facility]. Pt alert to self. Sinus rhythm with oxygen saturations >92% on room air. Cooperative. Bath given. Dr. Waldron Labs notified of pt arrival. No s/s of distress at this time.

## 2018-10-18 DIAGNOSIS — N183 Chronic kidney disease, stage 3 (moderate): Secondary | ICD-10-CM

## 2018-10-18 DIAGNOSIS — I10 Essential (primary) hypertension: Secondary | ICD-10-CM

## 2018-10-18 DIAGNOSIS — E1165 Type 2 diabetes mellitus with hyperglycemia: Secondary | ICD-10-CM

## 2018-10-18 LAB — C-REACTIVE PROTEIN: CRP: 14.4 mg/dL — ABNORMAL HIGH (ref <1.0)

## 2018-10-18 LAB — CBC WITH DIFFERENTIAL/PLATELET
Abs Immature Granulocytes: 0.17 10*3/uL — ABNORMAL HIGH (ref 0.00–0.07)
Basophils Absolute: 0 10*3/uL (ref 0.0–0.1)
Basophils Relative: 0 %
Eosinophils Absolute: 0 10*3/uL (ref 0.0–0.5)
Eosinophils Relative: 0 %
HCT: 44.3 % (ref 39.0–52.0)
Hemoglobin: 15 g/dL (ref 13.0–17.0)
Immature Granulocytes: 2 %
Lymphocytes Relative: 6 %
Lymphs Abs: 0.7 10*3/uL (ref 0.7–4.0)
MCH: 30.9 pg (ref 26.0–34.0)
MCHC: 33.9 g/dL (ref 30.0–36.0)
MCV: 91.3 fL (ref 80.0–100.0)
Monocytes Absolute: 0.4 10*3/uL (ref 0.1–1.0)
Monocytes Relative: 4 %
Neutro Abs: 9.9 10*3/uL — ABNORMAL HIGH (ref 1.7–7.7)
Neutrophils Relative %: 88 %
Platelets: 166 10*3/uL (ref 150–400)
RBC: 4.85 MIL/uL (ref 4.22–5.81)
RDW: 13.2 % (ref 11.5–15.5)
WBC: 11.1 10*3/uL — ABNORMAL HIGH (ref 4.0–10.5)
nRBC: 0 % (ref 0.0–0.2)

## 2018-10-18 LAB — COMPREHENSIVE METABOLIC PANEL
ALT: 32 U/L (ref 0–44)
AST: 19 U/L (ref 15–41)
Albumin: 3.4 g/dL — ABNORMAL LOW (ref 3.5–5.0)
Alkaline Phosphatase: 41 U/L (ref 38–126)
Anion gap: 13 (ref 5–15)
BUN: 40 mg/dL — ABNORMAL HIGH (ref 8–23)
CO2: 23 mmol/L (ref 22–32)
Calcium: 8.7 mg/dL — ABNORMAL LOW (ref 8.9–10.3)
Chloride: 97 mmol/L — ABNORMAL LOW (ref 98–111)
Creatinine, Ser: 1.74 mg/dL — ABNORMAL HIGH (ref 0.61–1.24)
GFR calc Af Amer: 41 mL/min — ABNORMAL LOW (ref >60)
GFR calc non Af Amer: 35 mL/min — ABNORMAL LOW (ref >60)
Glucose, Bld: 153 mg/dL — ABNORMAL HIGH (ref 70–99)
Potassium: 4.1 mmol/L (ref 3.5–5.1)
Sodium: 133 mmol/L — ABNORMAL LOW (ref 135–145)
Total Bilirubin: 1 mg/dL (ref 0.3–1.2)
Total Protein: 7.4 g/dL (ref 6.5–8.1)

## 2018-10-18 LAB — GLUCOSE, CAPILLARY
Glucose-Capillary: 132 mg/dL — ABNORMAL HIGH (ref 70–99)
Glucose-Capillary: 283 mg/dL — ABNORMAL HIGH (ref 70–99)
Glucose-Capillary: 376 mg/dL — ABNORMAL HIGH (ref 70–99)
Glucose-Capillary: 323 mg/dL — ABNORMAL HIGH (ref 70–99)

## 2018-10-18 LAB — FERRITIN: Ferritin: 1133 ng/mL — ABNORMAL HIGH (ref 24–336)

## 2018-10-18 LAB — ABO/RH: ABO/RH(D): O POS

## 2018-10-18 LAB — PROCALCITONIN: Procalcitonin: 0.18 ng/mL

## 2018-10-18 LAB — CREATININE, SERUM
Creatinine, Ser: 1.74 mg/dL — ABNORMAL HIGH (ref 0.61–1.24)
GFR calc Af Amer: 41 mL/min — ABNORMAL LOW (ref >60)
GFR calc non Af Amer: 35 mL/min — ABNORMAL LOW (ref >60)

## 2018-10-18 LAB — D-DIMER, QUANTITATIVE: D-Dimer, Quant: 1.64 ug/mL-FEU — ABNORMAL HIGH (ref 0.00–0.50)

## 2018-10-18 MED ORDER — ENSURE ENLIVE PO LIQD
237.0000 mL | Freq: Three times a day (TID) | ORAL | Status: DC
  Administered 2018-10-18 – 2018-10-21 (×9): 237 mL via ORAL

## 2018-10-18 MED ORDER — SODIUM CHLORIDE 0.9 % IV SOLN
1000.0000 mL | INTRAVENOUS | Status: DC
  Administered 2018-10-18: 22:00:00 1000 mL via INTRAVENOUS

## 2018-10-18 MED ORDER — METHYLPREDNISOLONE SODIUM SUCC 125 MG IJ SOLR
60.0000 mg | Freq: Three times a day (TID) | INTRAMUSCULAR | Status: DC
  Administered 2018-10-18 – 2018-10-19 (×2): 60 mg via INTRAVENOUS
  Filled 2018-10-18 (×2): qty 2

## 2018-10-18 MED ORDER — ACETAMINOPHEN 325 MG PO TABS
650.0000 mg | ORAL_TABLET | Freq: Four times a day (QID) | ORAL | Status: DC | PRN
  Administered 2018-10-18 – 2018-10-21 (×4): 650 mg via ORAL
  Filled 2018-10-18 (×5): qty 2

## 2018-10-18 MED ORDER — ZINC SULFATE 220 (50 ZN) MG PO CAPS
220.0000 mg | ORAL_CAPSULE | Freq: Every day | ORAL | Status: DC
  Administered 2018-10-18 – 2018-10-22 (×5): 220 mg via ORAL
  Filled 2018-10-18 (×5): qty 1

## 2018-10-18 MED ORDER — VITAMIN C 500 MG PO TABS
500.0000 mg | ORAL_TABLET | Freq: Every day | ORAL | Status: DC
  Administered 2018-10-18 – 2018-10-22 (×5): 500 mg via ORAL
  Filled 2018-10-18 (×5): qty 1

## 2018-10-18 MED ORDER — PANTOPRAZOLE SODIUM 40 MG PO TBEC
40.0000 mg | DELAYED_RELEASE_TABLET | Freq: Every day | ORAL | Status: DC
  Administered 2018-10-18 – 2018-10-22 (×5): 40 mg via ORAL
  Filled 2018-10-18 (×5): qty 1

## 2018-10-18 NOTE — TOC Initial Note (Signed)
Transition of Care [facility]) - Initial/Assessment Note    Patient Details  Name: Tony Vasquez MRN: [id number] Date of Birth: 17-Jan-1933  Transition of [facility]) CM/SW Contact:    Shade Flood, LCSW Phone Number: 10/18/2018, 11:12 AM  Clinical Narrative:                  Pt admitted from home. He was recently discharged (8/28) from [facility]. Pt went home with [facility] PT and OT at that dc. TOC will follow for support and dc planning as needed.  Expected Discharge Plan: [place] Barriers to Discharge: Continued Medical Work up   Patient Goals and CMS Choice        Expected Discharge Plan and Services Expected Discharge Plan: [place]       Living arrangements for the past 2 months: Single Family Home Expected Discharge Date: (unknown)                                    Prior Living Arrangements/Services Living arrangements for the past 2 months: Single Family Home Lives with:: Adult Children Patient language and need for interpreter reviewed:: Yes        Need for Family Participation in Patient Care: Yes (Comment) Care giver support system in place?: Yes (comment) Current home services: Home PT, Home OT([facility]) Criminal Activity/Legal Involvement Pertinent to Current Situation/Hospitalization: No - Comment as needed  Activities of Daily Living Home Assistive Devices/Equipment: Cane (specify quad or straight), CBG Meter, Shower chair without back, Walker (specify type), Other (Comment)(single point cane, front wheeled walker, tub/shower unit) ADL Screening (condition at time of admission) Patient's cognitive ability adequate to safely complete daily activities?: No Is the patient deaf or have difficulty hearing?: No Does the patient have difficulty seeing, even when wearing glasses/contacts?: No Does the patient have difficulty concentrating, remembering, or making decisions?: Yes Patient able to express need for  assistance with ADLs?: Yes Does the patient have difficulty dressing or bathing?: Yes Independently performs ADLs?: No Communication: Independent Dressing (OT): Needs assistance Is this a change from baseline?: Pre-admission baseline Grooming: Needs assistance Is this a change from baseline?: Pre-admission baseline Feeding: Needs assistance Is this a change from baseline?: Pre-admission baseline Bathing: Needs assistance(son helps patient get doen in the tub) Is this a change from baseline?: Pre-admission baseline Toileting: Needs assistance Is this a change from baseline?: Change from baseline, expected to last >3days In/Out Bed: Needs assistance Is this a change from baseline?: Change from baseline, expected to last >3 days Walks in Home: Needs assistance Is this a change from baseline?: Change from baseline, expected to last >3 days Does the patient have difficulty walking or climbing stairs?: Yes Weakness of Legs: Both Weakness of Arms/Hands: Both  Permission Sought/Granted                  Emotional Assessment       Orientation: : Oriented to Self Alcohol / Substance Use: Not Applicable Psych Involvement: No (comment)  Admission diagnosis:  Healthcare-associated pneumonia [J18.9] Sepsis without acute organ dysfunction, due to unspecified organism ([place]) [A41.9] COVID-19 virus infection [U07.1] Patient Active Problem List   Diagnosis Date Noted  . COVID-19 virus infection 10/08/2018  . QT prolongation 10/11/2018  . Sinus bradycardia 10/11/2018  . Diabetes mellitus type 2, uncontrolled, with complications ([place]) [id number]  . CKD (chronic kidney disease), stage III ([place]) 10/10/2018  .  Benign essential HTN 10/10/2018  . Prostate cancer ([place]) 10/10/2018  . Generalized weakness 10/09/2018  . Hypoglycemia 08/23/2017  . Bradycardia   . Malignant neoplasm of prostate ([place]) 01/16/2014  . Secondary renovascular hypertension, benign 12/12/2012  . Chronic kidney disease,  unspecified 12/12/2012  . Type II or unspecified type diabetes mellitus with renal manifestations, not stated as uncontrolled(250.40) 12/12/2012  . Osteoarthrosis, unspecified whether generalized or localized, unspecified site 11/24/2012  . Acute bronchitis 11/24/2012  . Fever 10/31/2012  . Rash 10/31/2012  . Intractable hiccups 10/31/2012  . Renal insufficiency, mild 10/31/2012  . Thrombocytopenia ([place]) 10/31/2012  . Normocytic anemia 10/31/2012  . DM (diabetes mellitus) ([place]) 10/31/2012  . HTN (hypertension) 10/31/2012  . Dyslipidemia 10/31/2012   PCP:  Haywood Pao, MD Pharmacy:   [facility] [address], [address] [place] [address] Phone: [phone] Fax: [phone]     Social Determinants of Health (SDOH) Interventions    Readmission Risk Interventions No flowsheet data found.

## 2018-10-18 NOTE — Progress Notes (Signed)
Son updated via phone. All questions answered.

## 2018-10-18 NOTE — Progress Notes (Signed)
PROGRESS NOTE                                                                                                                                                                                                             Patient Demographics:    Tony Vasquez, is a 85 y.o. male, DOB - 05/04/1932, ZO:[id number]  Admit date - 10/07/2018   Admitting Physician Hosie Poisson, MD  Outpatient Primary MD for the patient is [name], [name], MD  LOS - 1   Chief Complaint  Patient presents with  . Fatigue  . COVID       Brief Narrative    85 y.o. male with medical history significant of hypertension, type2diabetes,osteoarthritis,  Dementia recently diagnosed COVID-19 illness discharged home on 10/14/2018 , brought in for generalized weakness and fatigue, poor oral intake, low-grade fevers and difficulty walking.   Subjective:    [facility] today significant events per staff as discussed with staff, but he remains febrile over 102 this morning.   Assessment  & Plan :    Active Problems:   Dyslipidemia   Chronic kidney disease, unspecified   Benign essential HTN   Diabetes mellitus type 2, uncontrolled, with complications ([place])   [id number] virus infection  COVID-19 infection -Patient presents with fever, generalized weakness, poor oral intake, chest x-ray significant for bilateral opacity, simply discharged.. -Patient with some mild increased work of breathing, but he has no hypoxia, so far no indication for Remdesivir or Actemra, but I will continue with IV steroids 60 mg IV every 8 hours. -Inflammatory markers trending up, ferritin 1.133 today, CRP 14.4. -Continue with zinc and vitamin C -Procalcitonin elevated, continue with broad-spectrum antibiotics   COVID-19 Labs  Recent Labs    09/30/2018 0411 10/14/2018 0412 10/18/18 0440  DDIMER  --   --  1.64*  FERRITIN  --  660* 1,133*  LDH 240*  --   --   CRP  --  10.4* 14.4*    Lab Results   Component Value Date   SARSCOV2NAA POSITIVE (A) 09/17/2018   SARSCOV2NAA POSITIVE (A) 10/09/2018   Diabetes mellitus -Continue with insulin sliding scale  Hypertension -Blood pressure acceptable, continue with Norvasc,(his beta-blockers were stopped during previous admission secondary to bradycardia)  CKD stage III -Renal function at baseline, decrease his IV fluid from 125 to 50 cc/h  Code Status : Full code  Family Communication  : Will discuss with son  Disposition Plan  : Pending further work-up  Barriers For Discharge : Remains on IV antibiotics, IV steroids  Consults  :  None  Procedures  : None  DVT Prophylaxis  :  [place] heparin  Lab Results  Component Value Date   PLT 166 10/18/2018    Antibiotics  :    Anti-infectives (From admission, onward)   Start     Dose/Rate Vasquez Frequency Ordered Stop   10/18/18 0800  vancomycin (VANCOCIN) IVPB 750 mg/150 ml premix     750 mg 150 mL/hr over 60 Minutes Intravenous Every 24 hours 10/16/2018 1152     10/02/2018 1400  ceFEPIme (MAXIPIME) 2 g in sodium chloride 0.9 % 100 mL IVPB     2 g 200 mL/hr over 30 Minutes Intravenous Every 24 hours 10/07/2018 1152     09/23/2018 0630  vancomycin (VANCOCIN) 1,500 mg in sodium chloride 0.9 % 500 mL IVPB     1,500 mg 250 mL/hr over 120 Minutes Intravenous  Once 10/09/2018 0628 10/11/2018 0951   10/02/2018 0615  vancomycin (VANCOCIN) IVPB 1000 mg/200 mL premix  Status:  Discontinued     1,000 mg 200 mL/hr over 60 Minutes Intravenous  Once 10/13/2018 0606 09/22/2018 0627   09/23/2018 0615  aztreonam (AZACTAM) 2 g in sodium chloride 0.9 % 100 mL IVPB     2 g 200 mL/hr over 30 Minutes Intravenous  Once 09/27/2018 0606 09/18/2018 0713        Objective:   Vitals:   10/18/18 0000 10/18/18 0500 10/18/18 0800 10/18/18 1200  BP: 136/77 (!) 147/73 (!) 153/70 129/66  Pulse:   78 82  Resp:   (!) 21 15  Temp: 99.9 F (37.7 C) 99.5 F (37.5 C) (!) 102 F (38.9 C) 99.1 F (37.3 C)  TempSrc: Oral Oral Oral Oral   SpO2: 97% 96% 100% 98%  Weight:      Height:        Wt Readings from Last 3 Encounters:  10/06/2018 72.5 kg  10/13/18 76.1 kg  08/24/17 74 kg     Intake/Output Summary (Last 24 hours) at 10/18/2018 1322 Last data filed at 10/18/2018 0900 Gross per 24 hour  Intake 583.05 ml  Output 1225 ml  Net -641.95 ml     Physical Exam  Ill appearing male, laying in bed, in no apparent distress . Symmetrical Chest wall movement, diminished air entry at the bases, but clear to auscultation, no wheezing RRR,No Gallops,Rubs or new Murmurs, No Parasternal Heave +ve B.Sounds, Abd Soft, No tenderness,  No rebound - guarding or rigidity. No Cyanosis, Clubbing or edema, No new Rash or bruise      Data Review:    CBC Recent Labs  Lab 10/12/18 0235 10/13/18 0320 10/14/18 0313 09/20/2018 0411 10/18/18 0440  WBC 10.4 11.9* 11.0* 8.7 11.1*  HGB 14.2 13.5 14.5 14.5 15.0  HCT 43.0 40.6 43.4 43.5 44.3  PLT 167 155 144* 170 166  MCV 91.9 90.8 91.4 91.8 91.3  MCH 30.3 30.2 30.5 30.6 30.9  MCHC 33.0 33.3 33.4 33.3 33.9  RDW 12.7 12.7 12.9 13.3 13.2  LYMPHSABS 0.9 1.4 2.2 0.4* 0.7  MONOABS 0.4 0.6 0.7 0.6 0.4  EOSABS 0.0 0.0 0.1 0.0 0.0  BASOSABS 0.0 0.0 0.1 0.0 0.0    Chemistries  Recent Labs  Lab 10/12/18 0235 10/13/18 0320 10/14/18 0313 10/16/2018 0411 10/18/18 0440  NA 138 136 141 134*  133*  K 3.5 3.9 3.2* 5.0 4.1  CL 107 106 108 101 97*  CO2 22 22 25  21* 23  GLUCOSE 263* 192* 168* 318* 153*  BUN 47* 43* 42* 48* 40*  CREATININE 1.81* 1.62* 1.78* 1.92* 1.74*  1.74*  CALCIUM 8.3* 7.9* 8.1* 9.2 8.7*  MG 1.9 1.8 1.8  --   --   AST 27 27 60* 26 19  ALT 23 31 85* 47* 32  ALKPHOS 48 47 51 49 41  BILITOT 0.3 0.4 0.4 0.9 1.0   ------------------------------------------------------------------------------------------------------------------ Recent Labs    09/23/2018 0412  TRIG 106    Lab Results  Component Value Date   HGBA1C 8.7 (H) 10/09/2018    ------------------------------------------------------------------------------------------------------------------ No results for input(s): TSH, T4TOTAL, T3FREE, THYROIDAB in the last 72 hours.  Invalid input(s): FREET3 ------------------------------------------------------------------------------------------------------------------ Recent Labs    10/02/2018 0412 10/18/18 0440  FERRITIN 660* 1,133*    Coagulation profile No results for input(s): INR, PROTIME in the last 168 hours.  Recent Labs    10/18/18 0440  DDIMER 1.64*    Cardiac Enzymes No results for input(s): CKMB, TROPONINI, MYOGLOBIN in the last 168 hours.  Invalid input(s): CK ------------------------------------------------------------------------------------------------------------------ No results found for: BNP  Inpatient Medications  Scheduled Meds: . amLODipine  10 mg Oral Daily  . heparin injection (subcutaneous)  5,000 Units Subcutaneous Q8H  . insulin aspart  0-5 Units Subcutaneous QHS  . insulin aspart  0-9 Units Subcutaneous TID WC  . methylPREDNISolone (SOLU-MEDROL) injection  60 mg Intravenous Q8H  . pantoprazole  40 mg Oral Daily  . vitamin C  500 mg Oral Daily  . zinc sulfate  220 mg Oral Daily   Continuous Infusions: . sodium chloride    . ceFEPime (MAXIPIME) IV Stopped (10/06/2018 2237)  . vancomycin Stopped (10/18/18 0835)   PRN Meds:.acetaminophen  Micro Results Recent Results (from the past 240 hour(s))  SARS Coronavirus 2 [facility] order, Performed in [facility] hospital lab) Nasopharyngeal Nasopharyngeal Swab     Status: Abnormal   Collection Time: 10/09/18  6:41 AM   Specimen: Nasopharyngeal Swab  Result Value Ref Range Status   SARS Coronavirus 2 POSITIVE (A) NEGATIVE Final    Comment: RESULT CALLED TO, READ BACK BY AND VERIFIED WITH: S CLAPP,RN 10/09/18 1228 RHOLMES (NOTE) If result is NEGATIVE SARS-CoV-2 target nucleic acids are NOT DETECTED. The SARS-CoV-2 RNA is generally  detectable in upper and lower  respiratory specimens during the acute phase of infection. The lowest  concentration of SARS-CoV-2 viral copies this assay can detect is 250  copies / mL. A negative result does not preclude SARS-CoV-2 infection  and should not be used as the sole basis for treatment or other  patient management decisions.  A negative result may occur with  improper specimen collection / handling, submission of specimen other  than nasopharyngeal swab, presence of viral mutation(s) within the  areas targeted by this assay, and inadequate number of viral copies  (<250 copies / mL). A negative result must be combined with clinical  observations, patient history, and epidemiological information. If result is POSITIVE SARS-CoV-2 target nucleic acids are DETECTED. The S ARS-CoV-2 RNA is generally detectable in upper and lower  respiratory specimens during the acute phase of infection.  Positive  results are indicative of active infection with SARS-CoV-2.  Clinical  correlation with patient history and other diagnostic information is  necessary to determine patient infection status.  Positive results do  not rule out bacterial infection or co-infection with other viruses.  If result is PRESUMPTIVE POSTIVE SARS-CoV-2 nucleic acids MAY BE PRESENT.   A presumptive positive result was obtained on the submitted specimen  and confirmed on repeat testing.  While 2019 novel coronavirus  (SARS-CoV-2) nucleic acids may be present in the submitted sample  additional confirmatory testing may be necessary for epidemiological  and / or clinical management purposes  to differentiate between  SARS-CoV-2 and other Sarbecovirus currently known to infect humans.  If clinically indicated additional testing with an alternate test  methodology [phone]) is adv ised. The SARS-CoV-2 RNA is generally  detectable in upper and lower respiratory specimens during the acute  phase of infection. The  expected result is Negative. Fact Sheet for Patients:  StrictlyIdeas.no Fact Sheet for Healthcare Providers: BankingDealers.co.za This test is not yet approved or cleared by the [place] FDA and has been authorized for detection and/or diagnosis of SARS-CoV-2 by FDA under an Emergency Use Authorization (EUA).  This EUA will remain in effect (meaning this test can be used) for the duration of the COVID-19 declaration under Section 564(b)(1) of the Act, 21 U.S.C. section 360bbb-3(b)(1), unless the authorization is terminated or revoked sooner. Performed at [facility], [place] [address] [place], [place] [address]   Urine culture     Status: Abnormal (Preliminary result)   Collection Time: 10/07/2018  4:11 AM   Specimen: In/Out Cath Urine  Result Value Ref Range Status   Specimen Description   Final    IN/OUT CATH URINE Performed at [facility] [address] [place], [place] [address]    Special Requests   Final    NONE Performed at [facility], [place] [address], [place], [place] [address]    Culture (A)  Final    40,000 COLONIES/mL PROTEUS MIRABILIS SUSCEPTIBILITIES TO FOLLOW Performed at [facility], [place] [address] [place], [place] [address]    Report Status PENDING  Incomplete  SARS Coronavirus 2 [facility] order, Performed in [facility] hospital lab) Nasopharyngeal Nasopharyngeal Swab     Status: Abnormal   Collection Time: 09/27/2018  4:12 AM   Specimen: Nasopharyngeal Swab  Result Value Ref Range Status   SARS Coronavirus 2 POSITIVE (A) NEGATIVE Final    Comment: RESULT CALLED TO, READ BACK BY AND VERIFIED WITH: C.MOORE,RN [id number] @1048  BY V.WILKINS (NOTE) If result is NEGATIVE SARS-CoV-2 target nucleic acids are NOT DETECTED. The SARS-CoV-2 RNA is generally detectable in upper and lower  respiratory specimens during the acute phase of infection. The lowest   concentration of SARS-CoV-2 viral copies this assay can detect is 250  copies / mL. A negative result does not preclude SARS-CoV-2 infection  and should not be used as the sole basis for treatment or other  patient management decisions.  A negative result may occur with  improper specimen collection / handling, submission of specimen other  than nasopharyngeal swab, presence of viral mutation(s) within the  areas targeted by this assay, and inadequate number of viral copies  (<250 copies / mL). A negative result must be combined with clinical  observations, patient history, and epidemiological information. If result is POSITIVE SARS-CoV-2 target nucleic acids are DETECTED.  The SARS-CoV-2 RNA is generally detectable in upper and lower  respiratory specimens during the acute phase of infection.  Positive  results are indicative of active infection with SARS-CoV-2.  Clinical  correlation with patient history and other diagnostic information is  necessary to determine patient infection status.  Positive results do  not  rule out bacterial infection or co-infection with other viruses. If result is PRESUMPTIVE POSTIVE SARS-CoV-2 nucleic acids MAY BE PRESENT.   A presumptive positive result was obtained on the submitted specimen  and confirmed on repeat testing.  While 2019 novel coronavirus  (SARS-CoV-2) nucleic acids may be present in the submitted sample  additional confirmatory testing may be necessary for epidemiological  and / or clinical management purposes  to differentiate between  SARS-CoV-2 and other Sarbecovirus currently known to infect humans.  If clinically indicated additional testing with an alternate test  methodology [phone]) i s advised. The SARS-CoV-2 RNA is generally  detectable in upper and lower respiratory specimens during the acute  phase of infection. The expected result is Negative. Fact Sheet for Patients:  StrictlyIdeas.no Fact Sheet  for Healthcare Providers: BankingDealers.co.za This test is not yet approved or cleared by the [place] FDA and has been authorized for detection and/or diagnosis of SARS-CoV-2 by FDA under an Emergency Use Authorization (EUA).  This EUA will remain in effect (meaning this test can be used) for the duration of the COVID-19 declaration under Section 564(b)(1) of the Act, 21 U.S.C. section 360bbb-3(b)(1), unless the authorization is terminated or revoked sooner. Performed at [facility], [place] [address], [place], [place] [address]   Blood Culture (routine x 2)     Status: None (Preliminary result)   Collection Time: 10/04/2018  4:16 AM   Specimen: BLOOD  Result Value Ref Range Status   Specimen Description   Final    BLOOD BLOOD LEFT HAND Performed at [facility] [address], [place], [place] [address]    Special Requests   Final    BOTTLES DRAWN AEROBIC ONLY Blood Culture results may not be optimal due to an inadequate volume of blood received in culture bottles Performed at [facility] [address], [place], [place] [address]    Culture   Final    NO GROWTH < 24 HOURS Performed at [facility] [address], [place], [place] [address]    Report Status PENDING  Incomplete    Radiology Reports Ct Head Wo Contrast  Result Date: 10/15/2018 CLINICAL DATA:  Unexplained altered level consciousness. EXAM: CT HEAD WITHOUT CONTRAST TECHNIQUE: Contiguous axial images were obtained from the base of the skull through the vertex without intravenous contrast. COMPARISON:  10/09/2018 FINDINGS: Brain: No evidence of acute infarction, hemorrhage, hydrocephalus, extra-axial collection, or mass lesion/mass effect. Moderate diffuse cerebral atrophy and chronic small vessel disease again demonstrated. Vascular:  No hyperdense vessel or other acute findings. Skull: No evidence of fracture or other significant  bone abnormality. Sinuses/Orbits:  No acute findings. Other: None. IMPRESSION: No acute intracranial abnormality. Stable cerebral atrophy and chronic small vessel disease. Electronically Signed   By: Marlaine Hind M.D.   On: 10/16/2018 06:45   Ct Head Wo Contrast  Result Date: 10/09/2018 CLINICAL DATA:  Patient with right-sided weakness. EXAM: CT HEAD WITHOUT CONTRAST TECHNIQUE: Contiguous axial images were obtained from the base of the skull through the vertex without intravenous contrast. COMPARISON:  Brain CT 09/17/2016 FINDINGS: Brain: Ventricles and sulci are prominent compatible with atrophy. Motion artifact limits evaluation. No evidence for acute cortically based infarct, intracranial hemorrhage, mass lesion or mass-effect. Vascular: Unremarkable Skull: Intact Sinuses/Orbits: Paranasal sinuses are well aerated. Mastoid air cells are unremarkable. Other: None. IMPRESSION: No acute intracranial process. Atrophy and chronic microvascular ischemic changes. Electronically Signed   By: Lovey Newcomer M.D.   On:  10/09/2018 05:32   Dg Chest Port 1 View  Result Date: 10/15/2018 CLINICAL DATA:  COVID-19, fever EXAM: PORTABLE CHEST 1 VIEW COMPARISON:  Radiograph October 09, 2018 FINDINGS: Patchy consolidation silhouettes portion of the right hemidiaphragm with additional opacity in the left lung periphery. No pneumothorax or effusion. Cardiomediastinal contours are unremarkable for the portable technique. No acute osseous or soft tissue abnormality. Degenerative changes are present in the imaged spine and shoulders. IMPRESSION: Findings compatible with multifocal pneumonia and patient's known diagnosis of COVID-19 Electronically Signed   By: Lovena Le M.D.   On: 10/14/2018 05:55   Dg Chest Port 1 View  Result Date: 10/09/2018 CLINICAL DATA:  Generalized weakness, dementia EXAM: PORTABLE CHEST 1 VIEW COMPARISON:  08/23/2017 FINDINGS: Lungs are clear.  No pleural effusion or pneumothorax. The heart is normal in  size. IMPRESSION: No evidence of acute cardiopulmonary disease. Electronically Signed   By: Julian Hy M.D.   On: 10/09/2018 13:40      Phillips Climes M.D on 10/18/2018 at 1:22 PM  Between 7am to 7pm - Pager - [phone]  After 7pm go to [email or web address] - password [facility]  [facility] -  Office  [phone]

## 2018-10-18 DEATH — deceased

## 2018-10-19 LAB — CBC WITH DIFFERENTIAL/PLATELET
Abs Immature Granulocytes: 0.15 10*3/uL — ABNORMAL HIGH (ref 0.00–0.07)
Basophils Absolute: 0 10*3/uL (ref 0.0–0.1)
Basophils Relative: 0 %
Eosinophils Absolute: 0 10*3/uL (ref 0.0–0.5)
Eosinophils Relative: 0 %
HCT: 42.6 % (ref 39.0–52.0)
Hemoglobin: 14.4 g/dL (ref 13.0–17.0)
Immature Granulocytes: 1 %
Lymphocytes Relative: 4 %
Lymphs Abs: 0.6 10*3/uL — ABNORMAL LOW (ref 0.7–4.0)
MCH: 30.6 pg (ref 26.0–34.0)
MCHC: 33.8 g/dL (ref 30.0–36.0)
MCV: 90.4 fL (ref 80.0–100.0)
Monocytes Absolute: 0.3 10*3/uL (ref 0.1–1.0)
Monocytes Relative: 2 %
Neutro Abs: 12.5 10*3/uL — ABNORMAL HIGH (ref 1.7–7.7)
Neutrophils Relative %: 93 %
Platelets: 158 10*3/uL (ref 150–400)
RBC: 4.71 MIL/uL (ref 4.22–5.81)
RDW: 13 % (ref 11.5–15.5)
WBC: 13.5 10*3/uL — ABNORMAL HIGH (ref 4.0–10.5)
nRBC: 0 % (ref 0.0–0.2)

## 2018-10-19 LAB — COMPREHENSIVE METABOLIC PANEL
ALT: 28 U/L (ref 0–44)
AST: 21 U/L (ref 15–41)
Albumin: 2.9 g/dL — ABNORMAL LOW (ref 3.5–5.0)
Alkaline Phosphatase: 40 U/L (ref 38–126)
Anion gap: 10 (ref 5–15)
BUN: 48 mg/dL — ABNORMAL HIGH (ref 8–23)
CO2: 23 mmol/L (ref 22–32)
Calcium: 8.2 mg/dL — ABNORMAL LOW (ref 8.9–10.3)
Chloride: 106 mmol/L (ref 98–111)
Creatinine, Ser: 2.05 mg/dL — ABNORMAL HIGH (ref 0.61–1.24)
GFR calc Af Amer: 34 mL/min — ABNORMAL LOW (ref >60)
GFR calc non Af Amer: 29 mL/min — ABNORMAL LOW (ref >60)
Glucose, Bld: 219 mg/dL — ABNORMAL HIGH (ref 70–99)
Potassium: 3.9 mmol/L (ref 3.5–5.1)
Sodium: 139 mmol/L (ref 135–145)
Total Bilirubin: 0.9 mg/dL (ref 0.3–1.2)
Total Protein: 6.9 g/dL (ref 6.5–8.1)

## 2018-10-19 LAB — GLUCOSE, CAPILLARY
Glucose-Capillary: 230 mg/dL — ABNORMAL HIGH (ref 70–99)
Glucose-Capillary: 362 mg/dL — ABNORMAL HIGH (ref 70–99)
Glucose-Capillary: 406 mg/dL — ABNORMAL HIGH (ref 70–99)
Glucose-Capillary: 247 mg/dL — ABNORMAL HIGH (ref 70–99)

## 2018-10-19 LAB — URINE CULTURE: Culture: 40000 — AB

## 2018-10-19 LAB — FERRITIN: Ferritin: 1625 ng/mL — ABNORMAL HIGH (ref 24–336)

## 2018-10-19 LAB — D-DIMER, QUANTITATIVE: D-Dimer, Quant: 1.62 ug/mL-FEU — ABNORMAL HIGH (ref 0.00–0.50)

## 2018-10-19 LAB — C-REACTIVE PROTEIN: CRP: 19.2 mg/dL — ABNORMAL HIGH (ref <1.0)

## 2018-10-19 MED ORDER — METHYLPREDNISOLONE SODIUM SUCC 40 MG IJ SOLR
40.0000 mg | Freq: Three times a day (TID) | INTRAMUSCULAR | Status: DC
  Administered 2018-10-19 – 2018-10-22 (×10): 40 mg via INTRAVENOUS
  Filled 2018-10-19 (×10): qty 1

## 2018-10-19 MED ORDER — INSULIN GLARGINE 100 UNIT/ML ~~LOC~~ SOLN
20.0000 [IU] | Freq: Every day | SUBCUTANEOUS | Status: DC
  Administered 2018-10-20: 20 [IU] via SUBCUTANEOUS
  Filled 2018-10-19: qty 0.2

## 2018-10-19 MED ORDER — INSULIN ASPART 100 UNIT/ML ~~LOC~~ SOLN
0.0000 [IU] | Freq: Three times a day (TID) | SUBCUTANEOUS | Status: DC
  Administered 2018-10-19: 20 [IU] via SUBCUTANEOUS
  Administered 2018-10-20: 15 [IU] via SUBCUTANEOUS
  Administered 2018-10-20: 20 [IU] via SUBCUTANEOUS
  Administered 2018-10-20: 08:00:00 15 [IU] via SUBCUTANEOUS
  Administered 2018-10-21: 20 [IU] via SUBCUTANEOUS
  Administered 2018-10-21: 11 [IU] via SUBCUTANEOUS
  Administered 2018-10-21: 17:00:00 7 [IU] via SUBCUTANEOUS
  Administered 2018-10-22: 15 [IU] via SUBCUTANEOUS
  Administered 2018-10-22: 13:00:00 20 [IU] via SUBCUTANEOUS
  Administered 2018-10-22: 18:00:00 15 [IU] via SUBCUTANEOUS

## 2018-10-19 MED ORDER — INSULIN GLARGINE 100 UNIT/ML ~~LOC~~ SOLN
15.0000 [IU] | Freq: Every day | SUBCUTANEOUS | Status: DC
  Administered 2018-10-19: 15 [IU] via SUBCUTANEOUS
  Filled 2018-10-19: qty 0.15

## 2018-10-19 NOTE — Progress Notes (Signed)
Called pts daughter to updated. All questions answered.

## 2018-10-19 NOTE — Evaluation (Signed)
Physical Therapy Evaluation Patient Details Name: Tony Vasquez MRN: [id number] DOB: 1932-07-24 Today's Date: 10/19/2018   History of Present Illness  85 y.o. male with medical history significant of hypertension, type2diabetes,osteoarthritis,  Dementia recently diagnosed COVID-19 illness discharged home on 10/14/2018 , brought in for generalized weakness and fatigue, poor oral intake, low-grade fevers and difficulty walking.  Clinical Impression   Pt admitted with above diagnosis. Patient known from previous admission and demonstrates a significant decline in functional mobility. Of note, pt had incr delay in processing compared to previous admission. He returned to hospital 10/11/2018 after discharge home with family 10/14/18. Pt currently with functional limitations due to the deficits listed below (see PT Problem List). Pt will benefit from skilled PT to increase their independence and safety with mobility to allow discharge to the venue listed below.       Follow Up Recommendations SNF;Supervision/Assistance - 24 hour    Equipment Recommendations  None recommended by PT    Recommendations for Other Services       Precautions / Restrictions Precautions Precautions: Fall      Mobility  Bed Mobility Overal bed mobility: Needs Assistance Bed Mobility: Sit to Sidelying;Rolling Rolling: Supervision       Sit to sidelying: Mod assist(to elevate legs onto bed)    Transfers Overall transfer level: Needs assistance Equipment used: Rolling walker (2 wheeled) Transfers: Sit to/from [facility] Sit to Stand: Mod assist Stand pivot transfers: Mod assist       General transfer comment: posterior lean (back on his heels) and unable to correct with multi-modal cues, thus required incr assist to prevent fall posteriorly  Ambulation/Gait             General Gait Details: unable to with +1 assist  Stairs            Wheelchair Mobility    Modified  Rankin (Stroke Patients Only)       Balance Overall balance assessment: Needs assistance Sitting-balance support: No upper extremity supported;Feet unsupported Sitting balance-Leahy Scale: Fair     Standing balance support: Bilateral upper extremity supported;During functional activity Standing balance-Leahy Scale: Zero Standing balance comment: no awareness/righting reaction with posterior lean                             Pertinent Vitals/Pain Pain Assessment: Faces Faces Pain Scale: No hurt    Home Living Family/patient expects to be discharged to:: Private residence Living Arrangements: Children Available Help at Discharge: Family;Available 24 hours/day Type of Home: House Home Access: Level entry     Home Layout: One level Home Equipment: Shower seat;Cane - single point;Walker - 2 wheels      Prior Function Level of Independence: Independent with assistive device(s)   Gait / Transfers Assistance Needed: uses cane independently (prior to recent hospitalization; on d/c using RW with supervision)  ADL's / Homemaking Assistance Needed: Son supervises bath; pt gets down into tub        Hand Dominance   Dominant Hand: Right    Extremity/Trunk Assessment   Upper Extremity Assessment Upper Extremity Assessment: Defer to OT evaluation    Lower Extremity Assessment Lower Extremity Assessment: Generalized weakness    Cervical / Trunk Assessment Cervical / Trunk Assessment: Kyphotic(forward head)  Communication   Communication: HOH  Cognition Arousal/Alertness: Lethargic Behavior During Therapy: Flat affect Overall Cognitive Status: Impaired/Different from baseline Area of Impairment: Orientation;Attention;Safety/judgement;Awareness;Problem solving;Memory;Following commands  Orientation Level: Disoriented to;Time;Situation;Place Current Attention Level: Sustained Memory: Decreased short-term memory Following Commands: Follows  one step commands inconsistently;Follows one step commands with increased time Safety/Judgement: Decreased awareness of safety;Decreased awareness of deficits Awareness: Intellectual Problem Solving: Slow processing;Decreased initiation;Difficulty sequencing;Requires verbal cues;Requires tactile cues General Comments: slower processing than previous admission. Less interactive; poor eye contact; required max tactile cues to each leg to step backwards toward bed to complete chair to bed pivot      General Comments General comments (skin integrity, edema, etc.): pt in urine soaked chair on arrival; pt with no awareness his urinal had either spilled or he was incontinent    Exercises     Assessment/Plan    PT Assessment Patient needs continued PT services  PT Problem List Decreased strength;Decreased balance;Decreased mobility;Decreased cognition;Decreased knowledge of use of DME;Decreased activity tolerance;Decreased safety awareness       PT Treatment Interventions DME instruction;Gait training;Functional mobility training;Therapeutic activities;Therapeutic exercise;Balance training;Cognitive remediation;Patient/family education    PT Goals (Current goals can be found in the Care Plan section)  Acute Rehab PT Goals Patient Stated Goal: none stated PT Goal Formulation: Patient unable to participate in goal setting Time For Goal Achievement: 11/02/18 Potential to Achieve Goals: Good    Frequency Min 2X/week   Barriers to discharge        Co-evaluation               AM-PAC PT "6 Clicks" Mobility  Outcome Measure Help needed turning from your back to your side while in a flat bed without using bedrails?: A Little Help needed moving from lying on your back to sitting on the side of a flat bed without using bedrails?: A Lot Help needed moving to and from a bed to a chair (including a wheelchair)?: A Lot Help needed standing up from a chair using your arms (e.g., wheelchair or  bedside chair)?: A Lot Help needed to walk in hospital room?: Total Help needed climbing 3-5 steps with a railing? : Total 6 Click Score: 11    End of Session Equipment Utilized During Treatment: Gait belt Activity Tolerance: Patient limited by lethargy Patient left: in bed;with call bell/phone within reach;with bed alarm set Nurse Communication: Mobility status;Other (comment)(?spilled urinal vs incontinent) PT Visit Diagnosis: Unsteadiness on feet (R26.81);Difficulty in walking, not elsewhere classified (R26.2)    Time: [id number] PT Time Calculation (min) (ACUTE ONLY): 26 min   Charges:   PT Evaluation $PT Eval Moderate Complexity: 1 Mod PT Treatments $Therapeutic Activity: 8-22 mins          Barry Brunner, PT      Rexanne Mano 10/19/2018, 4:45 PM

## 2018-10-19 NOTE — Progress Notes (Signed)
Inpatient Diabetes Program Recommendations  AACE/ADA: New Consensus Statement on Inpatient Glycemic Control (2015)  Target Ranges:  Prepandial:   less than 140 mg/dL      Peak postprandial:   less than 180 mg/dL (1-2 hours)      Critically ill patients:  140 - 180 mg/dL   Lab Results  Component Value Date   GLUCAP 362 (H) 10/19/2018   HGBA1C 8.7 (H) 10/09/2018    Review of Glycemic Control  Diabetes history: DM2 Outpatient Diabetes medications: Lantus 15 units QD, Humalog 3 units bid (L & D) Current orders for Inpatient glycemic control: Lantus 15 units QD, Novolog 0-9 units tidwc and 0-5 units QHS  On Solumedrol 40 mg Q8H. HgbA1C - 8.7%  Inpatient Diabetes Program Recommendations:     Add Novolog 3 units tidwc for meal coverage insulin if eating 50% meal.  Continue to follow.  Thank you. Lorenda Peck, RD, LDN, CDE Inpatient Diabetes Coordinator [phone]

## 2018-10-19 NOTE — Progress Notes (Signed)
Monitor reading QTC of 700. Called and had tele measure reading. Monitor inaccurate and QTC is 430

## 2018-10-19 NOTE — Progress Notes (Signed)
PROGRESS NOTE                                                                                                                                                                                                             Patient Demographics:    Tony Vasquez, is a 85 y.o. male, DOB - 03-Jun-1932, [id number]  Admit date - 09/21/2018   Admitting Physician Hosie Poisson, MD  Outpatient Primary MD for the patient is [name], [name], MD  LOS - 2   Chief Complaint  Patient presents with   Fatigue   COVID       Brief Narrative    85 y.o. male with medical history significant of hypertension, type2diabetes,osteoarthritis,  Dementia recently diagnosed COVID-19 illness discharged home on 10/14/2018 , brought in for generalized weakness and fatigue, poor oral intake, low-grade fevers and difficulty walking.   Subjective:    [facility] today reports appetite has improved, he is afebrile over last 24 hours, reports generalized weakness .   Assessment  & Plan :    Active Problems:   Dyslipidemia   Chronic kidney disease, unspecified   Benign essential HTN   Diabetes mellitus type 2, uncontrolled, with complications ([place])   [id number] virus infection  COVID-19 infection -Patient presents with fever, generalized weakness, poor oral intake, chest x-ray significant for bilateral opacity, significantly evaded inflammatory markers. -Patient remains with no oxygen requirement this morning, so far no indication for Remdesivir or Actemra. -Continue with IV Solu-Medrol. -Trend inflammatory markers closely, significantly trending up -Procalcitonin mildly elevated, broad-spectrum antibiotics, discontinue vancomycin, continue cefepime.   COVID-19 Labs  Recent Labs    10/10/2018 0411 10/12/2018 0412 10/18/18 0440 10/19/18 0600  DDIMER  --   --  1.64* 1.62*  FERRITIN  --  660* 1,133* 1,625*  LDH 240*  --   --   --   CRP  --  10.4* 14.4* 19.2*    Lab  Results  Component Value Date   SARSCOV2NAA POSITIVE (A) 10/02/2018   SARSCOV2NAA POSITIVE (A) 10/09/2018   Diabetes mellitus -Poorly controlled on sliding scale, will resume home dose Lantus 15 units subcu daily  Hypertension -Blood pressure acceptable, continue with Norvasc,(his beta-blockers were stopped during previous admission secondary to bradycardia)  CKD stage III -Renal function at baseline.  UTI -Growing Proteus, currently on cefepime, will narrow to Rocephin  in next 24 hours  Code Status : Full code  Family Communication  : Discussed with daughter via phone 9/1  Disposition Plan  : Will need SNF placement  Barriers For Discharge : Remains on IV antibiotics, IV steroids  Consults  :  None  Procedures  : None  DVT Prophylaxis  :  [place] heparin  Lab Results  Component Value Date   PLT 158 10/19/2018    Antibiotics  :    Anti-infectives (From admission, onward)   Start     Dose/Rate Route Frequency Ordered Stop   10/18/18 0800  vancomycin (VANCOCIN) IVPB 750 mg/150 ml premix  Status:  Discontinued     750 mg 150 mL/hr over 60 Minutes Intravenous Every 24 hours 10/01/2018 1152 10/19/18 1131   09/17/2018 1400  ceFEPIme (MAXIPIME) 2 g in sodium chloride 0.9 % 100 mL IVPB     2 g 200 mL/hr over 30 Minutes Intravenous Every 24 hours 09/24/2018 1152     10/01/2018 0630  vancomycin (VANCOCIN) 1,500 mg in sodium chloride 0.9 % 500 mL IVPB     1,500 mg 250 mL/hr over 120 Minutes Intravenous  Once 09/29/2018 0628 09/20/2018 0951   10/11/2018 0615  vancomycin (VANCOCIN) IVPB 1000 mg/200 mL premix  Status:  Discontinued     1,000 mg 200 mL/hr over 60 Minutes Intravenous  Once 10/14/2018 0606 10/16/2018 0627   09/24/2018 0615  aztreonam (AZACTAM) 2 g in sodium chloride 0.9 % 100 mL IVPB     2 g 200 mL/hr over 30 Minutes Intravenous  Once 10/13/2018 0606 10/14/2018 0713        Objective:   Vitals:   10/18/18 2030 10/18/18 2100 10/19/18 0400 10/19/18 0800  BP: 137/69 137/69 139/70 138/65    Pulse:    85  Resp: (!) 21  16 (!) 21  Temp: 99.6 F (37.6 C)  99.5 F (37.5 C) 99.9 F (37.7 C)  TempSrc:   Oral Oral  SpO2: 96%  95% 96%  Weight:      Height:        Wt Readings from Last 3 Encounters:  10/10/2018 72.5 kg  10/13/18 76.1 kg  08/24/17 74 kg     Intake/Output Summary (Last 24 hours) at 10/19/2018 1132 Last data filed at 10/19/2018 1000 Gross per 24 hour  Intake 1535.42 ml  Output 1400 ml  Net 135.42 ml     Physical Exam  Awake, sitting in recliner eating breakfast, appears to be more confused today, frail Symmetrical Chest wall movement, Good air movement bilaterally, CTAB RRR,No Gallops,Rubs or new Murmurs, No Parasternal Heave +ve B.Sounds, Abd Soft, No tenderness, No rebound - guarding or rigidity. No Cyanosis, Clubbing or edema, No new Rash or bruise      Data Review:    CBC Recent Labs  Lab 10/13/18 0320 10/14/18 0313 09/26/2018 0411 10/18/18 0440 10/19/18 0600  WBC 11.9* 11.0* 8.7 11.1* 13.5*  HGB 13.5 14.5 14.5 15.0 14.4  HCT 40.6 43.4 43.5 44.3 42.6  PLT 155 144* 170 166 158  MCV 90.8 91.4 91.8 91.3 90.4  MCH 30.2 30.5 30.6 30.9 30.6  MCHC 33.3 33.4 33.3 33.9 33.8  RDW 12.7 12.9 13.3 13.2 13.0  LYMPHSABS 1.4 2.2 0.4* 0.7 0.6*  MONOABS 0.6 0.7 0.6 0.4 0.3  EOSABS 0.0 0.1 0.0 0.0 0.0  BASOSABS 0.0 0.1 0.0 0.0 0.0    Chemistries  Recent Labs  Lab 10/13/18 0320 10/14/18 0313 09/23/2018 0411 10/18/18 0440 10/19/18 0600  NA 136 141  134* 133* 139  K 3.9 3.2* 5.0 4.1 3.9  CL 106 108 101 97* 106  CO2 22 25 21* 23 23  GLUCOSE 192* 168* 318* 153* 219*  BUN 43* 42* 48* 40* 48*  CREATININE 1.62* 1.78* 1.92* 1.74*   1.74* 2.05*  CALCIUM 7.9* 8.1* 9.2 8.7* 8.2*  MG 1.8 1.8  --   --   --   AST 27 60* 26 19 21   ALT 31 85* 47* 32 28  ALKPHOS 47 51 49 41 40  BILITOT 0.4 0.4 0.9 1.0 0.9   ------------------------------------------------------------------------------------------------------------------ Recent Labs    10/13/2018 0412   TRIG 106    Lab Results  Component Value Date   HGBA1C 8.7 (H) 10/09/2018   ------------------------------------------------------------------------------------------------------------------ No results for input(s): TSH, T4TOTAL, T3FREE, THYROIDAB in the last 72 hours.  Invalid input(s): FREET3 ------------------------------------------------------------------------------------------------------------------ Recent Labs    10/18/18 0440 10/19/18 0600  FERRITIN 1,133* 1,625*    Coagulation profile No results for input(s): INR, PROTIME in the last 168 hours.  Recent Labs    10/18/18 0440 10/19/18 0600  DDIMER 1.64* 1.62*    Cardiac Enzymes No results for input(s): CKMB, TROPONINI, MYOGLOBIN in the last 168 hours.  Invalid input(s): CK ------------------------------------------------------------------------------------------------------------------ No results found for: BNP  Inpatient Medications  Scheduled Meds:  amLODipine  10 mg Oral Daily   feeding supplement (ENSURE ENLIVE)  237 mL Oral TID BM   heparin injection (subcutaneous)  5,000 Units Subcutaneous Q8H   insulin aspart  0-5 Units Subcutaneous QHS   insulin aspart  0-9 Units Subcutaneous TID WC   insulin glargine  15 Units Subcutaneous Daily   methylPREDNISolone (SOLU-MEDROL) injection  40 mg Intravenous Q8H   pantoprazole  40 mg Oral Daily   vitamin C  500 mg Oral Daily   zinc sulfate  220 mg Oral Daily   Continuous Infusions:  sodium chloride 1,000 mL (10/18/18 2212)   ceFEPime (MAXIPIME) IV 2 g (10/18/18 1445)   PRN Meds:.acetaminophen  Micro Results Recent Results (from the past 240 hour(s))  Urine culture     Status: Abnormal   Collection Time: 09/18/2018  4:11 AM   Specimen: In/Out Cath Urine  Result Value Ref Range Status   Specimen Description   Final    IN/OUT CATH URINE Performed at [facility], [place] [address], [place], [place] [address]    Special  Requests   Final    NONE Performed at [facility], [place] [address] [place], [place] [address]    Culture 40,000 COLONIES/mL PROTEUS MIRABILIS (A)  Final   Report Status 10/19/2018 FINAL  Final   Organism ID, Bacteria PROTEUS MIRABILIS (A)  Final      Susceptibility   Proteus mirabilis - MIC*    AMPICILLIN <=2 SENSITIVE Sensitive     CEFAZOLIN <=4 SENSITIVE Sensitive     CEFTRIAXONE <=1 SENSITIVE Sensitive     CIPROFLOXACIN <=0.25 SENSITIVE Sensitive     GENTAMICIN <=1 SENSITIVE Sensitive     IMIPENEM 2 SENSITIVE Sensitive     NITROFURANTOIN 256 RESISTANT Resistant     TRIMETH/SULFA <=20 SENSITIVE Sensitive     AMPICILLIN/SULBACTAM <=2 SENSITIVE Sensitive     PIP/TAZO <=4 SENSITIVE Sensitive     * 40,000 COLONIES/mL PROTEUS MIRABILIS  SARS Coronavirus 2 [facility] order, Performed in [facility] hospital lab) Nasopharyngeal Nasopharyngeal Swab     Status: Abnormal   Collection Time: 10/05/2018  4:12 AM   Specimen: Nasopharyngeal Swab  Result Value Ref Range Status  SARS Coronavirus 2 POSITIVE (A) NEGATIVE Final    Comment: RESULT CALLED TO, READ BACK BY AND VERIFIED WITH: C.MOORE,RN [id number] @1048  BY V.WILKINS (NOTE) If result is NEGATIVE SARS-CoV-2 target nucleic acids are NOT DETECTED. The SARS-CoV-2 RNA is generally detectable in upper and lower  respiratory specimens during the acute phase of infection. The lowest  concentration of SARS-CoV-2 viral copies this assay can detect is 250  copies / mL. A negative result does not preclude SARS-CoV-2 infection  and should not be used as the sole basis for treatment or other  patient management decisions.  A negative result may occur with  improper specimen collection / handling, submission of specimen other  than nasopharyngeal swab, presence of viral mutation(s) within the  areas targeted by this assay, and inadequate number of viral copies  (<250 copies / mL). A negative result must be combined with clinical    observations, patient history, and epidemiological information. If result is POSITIVE SARS-CoV-2 target nucleic acids are DETECTED.  The SARS-CoV-2 RNA is generally detectable in upper and lower  respiratory specimens during the acute phase of infection.  Positive  results are indicative of active infection with SARS-CoV-2.  Clinical  correlation with patient history and other diagnostic information is  necessary to determine patient infection status.  Positive results do  not rule out bacterial infection or co-infection with other viruses. If result is PRESUMPTIVE POSTIVE SARS-CoV-2 nucleic acids MAY BE PRESENT.   A presumptive positive result was obtained on the submitted specimen  and confirmed on repeat testing.  While 2019 novel coronavirus  (SARS-CoV-2) nucleic acids may be present in the submitted sample  additional confirmatory testing may be necessary for epidemiological  and / or clinical management purposes  to differentiate between  SARS-CoV-2 and other Sarbecovirus currently known to infect humans.  If clinically indicated additional testing with an alternate test  methodology [phone]) i s advised. The SARS-CoV-2 RNA is generally  detectable in upper and lower respiratory specimens during the acute  phase of infection. The expected result is Negative. Fact Sheet for Patients:  StrictlyIdeas.no Fact Sheet for Healthcare Providers: BankingDealers.co.za This test is not yet approved or cleared by the [place] FDA and has been authorized for detection and/or diagnosis of SARS-CoV-2 by FDA under an Emergency Use Authorization (EUA).  This EUA will remain in effect (meaning this test can be used) for the duration of the COVID-19 declaration under Section 564(b)(1) of the Act, 21 U.S.C. section 360bbb-3(b)(1), unless the authorization is terminated or revoked sooner. Performed at [facility], [place]  [address] [place] [address], [place], [place] [address]   Blood Culture (routine x 2)     Status: None (Preliminary result)   Collection Time: 09/19/2018  4:16 AM   Specimen: BLOOD  Result Value Ref Range Status   Specimen Description   Final    BLOOD BLOOD LEFT HAND Performed at [place] [address] [place], [place] [address]    Special Requests   Final    BOTTLES DRAWN AEROBIC ONLY Blood Culture results may not be optimal due to an inadequate volume of blood received in culture bottles Performed at [place] [address], [place], [place] [address]    Culture   Final    NO GROWTH < 24 HOURS Performed at [place] [address] [place], [place] [address]    Report Status PENDING  Incomplete    Radiology Reports Ct Head Wo Contrast  Result  Date: 09/27/2018 CLINICAL DATA:  Unexplained altered level consciousness. EXAM: CT HEAD WITHOUT CONTRAST TECHNIQUE: Contiguous axial images were obtained from the base of the skull through the vertex without intravenous contrast. COMPARISON:  10/09/2018 FINDINGS: Brain: No evidence of acute infarction, hemorrhage, hydrocephalus, extra-axial collection, or mass lesion/mass effect. Moderate diffuse cerebral atrophy and chronic small vessel disease again demonstrated. Vascular:  No hyperdense vessel or other acute findings. Skull: No evidence of fracture or other significant bone abnormality. Sinuses/Orbits:  No acute findings. Other: None. IMPRESSION: No acute intracranial abnormality. Stable cerebral atrophy and chronic small vessel disease. Electronically Signed   By: Marlaine Hind M.D.   On: 09/24/2018 06:45   Ct Head Wo Contrast  Result Date: 10/09/2018 CLINICAL DATA:  Patient with right-sided weakness. EXAM: CT HEAD WITHOUT CONTRAST TECHNIQUE: Contiguous axial images were obtained from the base of the skull through the vertex without intravenous contrast. COMPARISON:  Brain CT 09/17/2016 FINDINGS: Brain:  Ventricles and sulci are prominent compatible with atrophy. Motion artifact limits evaluation. No evidence for acute cortically based infarct, intracranial hemorrhage, mass lesion or mass-effect. Vascular: Unremarkable Skull: Intact Sinuses/Orbits: Paranasal sinuses are well aerated. Mastoid air cells are unremarkable. Other: None. IMPRESSION: No acute intracranial process. Atrophy and chronic microvascular ischemic changes. Electronically Signed   By: Lovey Newcomer M.D.   On: 10/09/2018 05:32   Dg Chest Port 1 View  Result Date: 10/09/2018 CLINICAL DATA:  COVID-19, fever EXAM: PORTABLE CHEST 1 VIEW COMPARISON:  Radiograph October 09, 2018 FINDINGS: Patchy consolidation silhouettes portion of the right hemidiaphragm with additional opacity in the left lung periphery. No pneumothorax or effusion. Cardiomediastinal contours are unremarkable for the portable technique. No acute osseous or soft tissue abnormality. Degenerative changes are present in the imaged spine and shoulders. IMPRESSION: Findings compatible with multifocal pneumonia and patient's known diagnosis of COVID-19 Electronically Signed   By: Lovena Le M.D.   On: 10/10/2018 05:55   Dg Chest Port 1 View  Result Date: 10/09/2018 CLINICAL DATA:  Generalized weakness, dementia EXAM: PORTABLE CHEST 1 VIEW COMPARISON:  08/23/2017 FINDINGS: Lungs are clear.  No pleural effusion or pneumothorax. The heart is normal in size. IMPRESSION: No evidence of acute cardiopulmonary disease. Electronically Signed   By: Julian Hy M.D.   On: 10/09/2018 13:40      Phillips Climes M.D on 10/19/2018 at 11:32 AM  Between 7am to 7pm - Pager - [phone]  After 7pm go to [email or web address] - password [facility]  [facility] -  Office  [phone]

## 2018-10-19 NOTE — Progress Notes (Signed)
Occupational Therapy Evaluation Patient Details Name: Tony Vasquez MRN: [id number] DOB: Mar 22, 1932 Today's Date: 10/19/2018    History of Present Illness 85 y.o. male with medical history significant of hypertension, type2diabetes,osteoarthritis,  Dementia recently diagnosed COVID-19 illness discharged home on 10/14/2018 , brought in for generalized weakness and fatigue, poor oral intake, low-grade fevers and difficulty walking.   Clinical Impression   Familiar with this pt from previous admission. At DC, pt was S for mobility and ADL @ RW/[place] level. Pt currently requires Min A for limited mobility of 5 steps due to fatigue and HR increase to 122. SpO2 97 on RA (probe changed from finger to ear with good pleth). Pt more lethargic and requires increased time for processing with poor activity tolerance. New tremors making self feeding more difficult. Feel pt will most likely require rehab at SNF unless he progresses. Will follow acutely.     Follow Up Recommendations  SNF;Supervision/Assistance - 24 hour    Equipment Recommendations  None recommended by OT    Recommendations for Other Services PT consult     Precautions / Restrictions Precautions Precautions: Fall      Mobility Bed Mobility               General bed mobility comments: OOB in chair  Transfers Overall transfer level: Needs assistance Equipment used: Rolling walker (2 wheeled) Transfers: Sit to/from Stand Sit to Stand: Min assist         General transfer comment: increased VC and increased time    Balance     Sitting balance-Leahy Scale: Fair       Standing balance-Leahy Scale: Poor                             ADL either performed or assessed with clinical judgement   ADL Overall ADL's : Needs assistance/impaired Eating/Feeding: Minimal assistance Eating/Feeding Details (indicate cue type and reason): pt with new tremors; may benefit from lidded cup Grooming: Minimal  assistance;Sitting   Upper Body Bathing: Minimal assistance;Sitting   Lower Body Bathing: Moderate assistance;Sit to/from stand   Upper Body Dressing : Minimal assistance;Sitting   Lower Body Dressing: Moderate assistance;Sit to/from stand   Toilet Transfer: Minimal assistance;BSC;Stand-pivot   Toileting- Clothing Manipulation and Hygiene: Maximal assistance;Sit to/from stand Toileting - Clothing Manipulation Details (indicate cue type and reason): incontinent of urine; condom cath off     Functional mobility during ADLs: Minimal assistance;Cueing for safety;Cueing for sequencing;Rolling walker General ADL Comments: increased processing time for ADL     Vision         Perception     Praxis      Pertinent Vitals/Pain Pain Assessment: Faces Faces Pain Scale: No hurt     Hand Dominance Right   Extremity/Trunk Assessment Upper Extremity Assessment Upper Extremity Assessment: Generalized weakness   Lower Extremity Assessment Lower Extremity Assessment: Defer to PT evaluation   Cervical / Trunk Assessment Cervical / Trunk Assessment: Kyphotic(forward head)   Communication Communication Communication: HOH   Cognition Arousal/Alertness: Lethargic Behavior During Therapy: Flat affect Overall Cognitive Status: Impaired/Different from baseline Area of Impairment: Orientation;Attention;Safety/judgement;Awareness;Problem solving;Memory                 Orientation Level: Disoriented to;Time;Situation;Place(able to say hospital when given choices) Current Attention Level: Sustained Memory: Decreased short-term memory   Safety/Judgement: Decreased awareness of safety;Decreased awareness of deficits Awareness: Emergent Problem Solving: Slow processing;Decreased initiation General Comments: slower processing than previous admission. Less  interactive; poor eye contact   General Comments       Exercises     Shoulder Instructions      Home Living Family/patient  expects to be discharged to:: Private residence Living Arrangements: Children Available Help at Discharge: Family;Available 24 hours/day Type of Home: House Home Access: Level entry     Home Layout: One level     Bathroom Shower/Tub: Teacher, early years/pre: Standard Bathroom Accessibility: Yes How Accessible: Accessible via walker Home Equipment: Shower seat;Cane - single point;Walker - 2 wheels          Prior Functioning/Environment Level of Independence: Independent with assistive device(s)  Gait / Transfers Assistance Needed: uses cane independently ADL's / Homemaking Assistance Needed: Son supervises bath; pt gets down into tub            OT Problem List: Decreased strength;Decreased activity tolerance;Impaired balance (sitting and/or standing);Decreased cognition;Decreased safety awareness;Decreased knowledge of use of DME or AE;Cardiopulmonary status limiting activity      OT Treatment/Interventions: Self-care/ADL training;Therapeutic exercise;Neuromuscular education;Energy conservation;DME and/or AE instruction;Therapeutic activities;Cognitive remediation/compensation;Patient/family education;Balance training    OT Goals(Current goals can be found in the care plan section) Acute Rehab OT Goals Patient Stated Goal: none stated OT Goal Formulation: Patient unable to participate in goal setting Time For Goal Achievement: 11/02/18 Potential to Achieve Goals: Good  OT Frequency: Min 2X/week   Barriers to D/C:            Co-evaluation              AM-PAC OT "6 Clicks" Daily Activity     Outcome Measure Help from another person eating meals?: A Little Help from another person taking care of personal grooming?: A Little Help from another person toileting, which includes using toliet, bedpan, or urinal?: A Lot Help from another person bathing (including washing, rinsing, drying)?: A Lot Help from another person to put on and taking off regular  upper body clothing?: A Little Help from another person to put on and taking off regular lower body clothing?: A Lot 6 Click Score: 15   End of Session Equipment Utilized During Treatment: Rolling walker Nurse Communication: Mobility status  Activity Tolerance: Patient tolerated treatment well Patient left: in chair;with call bell/phone within reach;with chair alarm set  OT Visit Diagnosis: Unsteadiness on feet (R26.81);Muscle weakness (generalized) (M62.81);Other symptoms and signs involving cognitive function                Time: [id number] OT Time Calculation (min): 35 min Charges:  OT General Charges $OT Visit: 1 Visit OT Evaluation $OT Eval Moderate Complexity: 1 Mod OT Treatments $Self Care/Home Management : 8-22 mins  Maurie Boettcher, OT/L   Acute OT Clinical Specialist [place] Pager [phone] Office [phone]   [facility] 10/19/2018, 10:51 AM

## 2018-10-20 ENCOUNTER — Other Ambulatory Visit: Payer: Self-pay

## 2018-10-20 ENCOUNTER — Inpatient Hospital Stay (HOSPITAL_COMMUNITY): Payer: Medicare Other

## 2018-10-20 LAB — COMPREHENSIVE METABOLIC PANEL
ALT: 34 U/L (ref 0–44)
AST: 39 U/L (ref 15–41)
Albumin: 2.6 g/dL — ABNORMAL LOW (ref 3.5–5.0)
Alkaline Phosphatase: 43 U/L (ref 38–126)
Anion gap: 12 (ref 5–15)
BUN: 63 mg/dL — ABNORMAL HIGH (ref 8–23)
CO2: 21 mmol/L — ABNORMAL LOW (ref 22–32)
Calcium: 8 mg/dL — ABNORMAL LOW (ref 8.9–10.3)
Chloride: 107 mmol/L (ref 98–111)
Creatinine, Ser: 2.64 mg/dL — ABNORMAL HIGH (ref 0.61–1.24)
GFR calc Af Amer: 25 mL/min — ABNORMAL LOW (ref >60)
GFR calc non Af Amer: 21 mL/min — ABNORMAL LOW (ref >60)
Glucose, Bld: 241 mg/dL — ABNORMAL HIGH (ref 70–99)
Potassium: 4.1 mmol/L (ref 3.5–5.1)
Sodium: 140 mmol/L (ref 135–145)
Total Bilirubin: 0.7 mg/dL (ref 0.3–1.2)
Total Protein: 6.6 g/dL (ref 6.5–8.1)

## 2018-10-20 LAB — CBC WITH DIFFERENTIAL/PLATELET
Abs Immature Granulocytes: 0.15 10*3/uL — ABNORMAL HIGH (ref 0.00–0.07)
Basophils Absolute: 0 10*3/uL (ref 0.0–0.1)
Basophils Relative: 0 %
Eosinophils Absolute: 0 10*3/uL (ref 0.0–0.5)
Eosinophils Relative: 0 %
HCT: 42.8 % (ref 39.0–52.0)
Hemoglobin: 14 g/dL (ref 13.0–17.0)
Immature Granulocytes: 1 %
Lymphocytes Relative: 4 %
Lymphs Abs: 0.5 10*3/uL — ABNORMAL LOW (ref 0.7–4.0)
MCH: 30.2 pg (ref 26.0–34.0)
MCHC: 32.7 g/dL (ref 30.0–36.0)
MCV: 92.2 fL (ref 80.0–100.0)
Monocytes Absolute: 0.2 10*3/uL (ref 0.1–1.0)
Monocytes Relative: 1 %
Neutro Abs: 14 10*3/uL — ABNORMAL HIGH (ref 1.7–7.7)
Neutrophils Relative %: 94 %
Platelets: 161 10*3/uL (ref 150–400)
RBC: 4.64 MIL/uL (ref 4.22–5.81)
RDW: 13.5 % (ref 11.5–15.5)
WBC: 14.9 10*3/uL — ABNORMAL HIGH (ref 4.0–10.5)
nRBC: 0 % (ref 0.0–0.2)

## 2018-10-20 LAB — BRAIN NATRIURETIC PEPTIDE: B Natriuretic Peptide: 106.9 pg/mL — ABNORMAL HIGH (ref 0.0–100.0)

## 2018-10-20 LAB — C-REACTIVE PROTEIN: CRP: 19.5 mg/dL — ABNORMAL HIGH (ref <1.0)

## 2018-10-20 LAB — D-DIMER, QUANTITATIVE
D-Dimer, Quant: 0.89 ug/mL-FEU — ABNORMAL HIGH (ref 0.00–0.50)
D-Dimer, Quant: 1.18 ug/mL-FEU — ABNORMAL HIGH (ref 0.00–0.50)

## 2018-10-20 LAB — GLUCOSE, CAPILLARY
Glucose-Capillary: 309 mg/dL — ABNORMAL HIGH (ref 70–99)
Glucose-Capillary: 383 mg/dL — ABNORMAL HIGH (ref 70–99)
Glucose-Capillary: 319 mg/dL — ABNORMAL HIGH (ref 70–99)
Glucose-Capillary: 271 mg/dL — ABNORMAL HIGH (ref 70–99)

## 2018-10-20 LAB — FERRITIN: Ferritin: 2476 ng/mL — ABNORMAL HIGH (ref 24–336)

## 2018-10-20 MED ORDER — SODIUM CHLORIDE 0.9 % IV SOLN
100.0000 mg | INTRAVENOUS | Status: DC
  Administered 2018-10-21: 100 mg via INTRAVENOUS
  Filled 2018-10-20 (×2): qty 20

## 2018-10-20 MED ORDER — SODIUM CHLORIDE 0.9 % IV SOLN: 2.0000 g | Freq: Once | INTRAVENOUS | Status: DC

## 2018-10-20 MED ORDER — SODIUM CHLORIDE 0.9 % IV SOLN
200.0000 mg | Freq: Once | INTRAVENOUS | Status: AC
  Administered 2018-10-20: 200 mg via INTRAVENOUS
  Filled 2018-10-20: qty 40

## 2018-10-20 MED ORDER — SODIUM CHLORIDE 0.9 % IV SOLN
INTRAVENOUS | Status: AC
  Administered 2018-10-20: 14:00:00 via INTRAVENOUS

## 2018-10-20 MED ORDER — INSULIN GLARGINE 100 UNIT/ML ~~LOC~~ SOLN
28.0000 [IU] | Freq: Every day | SUBCUTANEOUS | Status: DC
  Administered 2018-10-21: 09:00:00 28 [IU] via SUBCUTANEOUS
  Filled 2018-10-20: qty 0.28

## 2018-10-20 MED ORDER — INSULIN GLARGINE 100 UNIT/ML ~~LOC~~ SOLN
8.0000 [IU] | Freq: Once | SUBCUTANEOUS | Status: AC
  Administered 2018-10-20: 8 [IU] via SUBCUTANEOUS
  Filled 2018-10-20: qty 0.08

## 2018-10-20 NOTE — Progress Notes (Signed)
2102 Telemetry called to replace o2 sensor.  After replacing O2 sensor sats were 65-70.  O2 applied at 2L and incresed to 6L to get sats to 88%.  HFNC applied 6.5L sats 91-93 currently.  Pt noted to be diaphoretic and QTC monitor ranging from [phone].  Telemetry called to verify. Doctor notifed.  EKG done and lab orders put in. Charge nurse notified as well.

## 2018-10-20 NOTE — NC FL2 (Signed)
[place] MEDICAID FL2 LEVEL OF CARE SCREENING TOOL     IDENTIFICATION  Patient Name: Tony Vasquez Birthdate: Jun 02, 1932 Sex: male Admission Date (Current Location): 10/12/2018  [facility] and [place] Number:  Herbalist and Address:  [facility] [place], [place] [address], [place], [place] [address]([facility] [place]. [place] [facility])      Provider Number: [id number]  Attending Physician Name and Address:  Albertine Patricia, MD  Relative Name and Phone Number:  [name] [phone]     [place]    Current Level of Care: Hospital Recommended Level of Care: [place] Prior Approval Number:    Date Approved/Denied:   PASRR Number: [id number]  Discharge Plan: SNF    Current Diagnoses: Patient Active Problem List   Diagnosis Date Noted  . COVID-19 virus infection 10/12/2018  . QT prolongation 10/11/2018  . Sinus bradycardia 10/11/2018  . Diabetes mellitus type 2, uncontrolled, with complications ([place]) [id number]  . CKD (chronic kidney disease), stage III ([place]) 10/10/2018  . Benign essential HTN 10/10/2018  . Prostate cancer ([place]) 10/10/2018  . Generalized weakness 10/09/2018  . Hypoglycemia 08/23/2017  . Bradycardia   . Malignant neoplasm of prostate ([place]) 01/16/2014  . Secondary renovascular hypertension, benign 12/12/2012  . Chronic kidney disease, unspecified 12/12/2012  . Type II or unspecified type diabetes mellitus with renal manifestations, not stated as uncontrolled(250.40) 12/12/2012  . Osteoarthrosis, unspecified whether generalized or localized, unspecified site 11/24/2012  . Acute bronchitis 11/24/2012  . Fever 10/31/2012  . Rash 10/31/2012  . Intractable hiccups 10/31/2012  . Renal insufficiency, mild 10/31/2012  . Thrombocytopenia ([place]) 10/31/2012  . Normocytic anemia 10/31/2012  . DM (diabetes mellitus) ([place]) 10/31/2012  . HTN (hypertension) 10/31/2012  . Dyslipidemia 10/31/2012     Orientation RESPIRATION BLADDER Height & Weight     Self  Normal Incontinent Weight: 72.5 kg Height:  5\' 8"  (172.7 cm)  BEHAVIORAL SYMPTOMS/MOOD NEUROLOGICAL BOWEL NUTRITION STATUS      Incontinent Diet  AMBULATORY STATUS COMMUNICATION OF NEEDS Skin   Limited Assist Verbally Normal                       Personal Care Assistance Level of Assistance  Dressing, Bathing Bathing Assistance: Maximum assistance Feeding assistance: Limited assistance Dressing Assistance: Maximum assistance     Functional Limitations Info             SPECIAL CARE FACTORS FREQUENCY  PT (By licensed PT), OT (By licensed OT)     PT Frequency: 5x/week OT Frequency: min 3x/week            Contractures Contractures Info: Not present    Additional Factors Info  Code Status Code Status Info: Full code             Current Medications (10/20/2018):  This is the current hospital active medication list Current Facility-Administered Medications  Medication Dose Route Frequency Provider Last Rate Last Dose  . 0.9 %  sodium chloride infusion   Intravenous Continuous Elgergawy, Silver Huguenin, MD 100 mL/hr at 10/20/18 1405    . acetaminophen (TYLENOL) tablet 650 mg  650 mg Oral Q6H PRN Elgergawy, Silver Huguenin, MD   650 mg at 10/19/18 1750  . ceFEPIme (MAXIPIME) 2 g in sodium chloride 0.9 % 100 mL IVPB  2 g Intravenous Q24H Pham, Anh P, RPH 200 mL/hr at 10/20/18 1436 2 g at 10/20/18 1436  . feeding supplement (ENSURE ENLIVE) (ENSURE ENLIVE) liquid 237  mL  237 mL Oral TID BM Elgergawy, Silver Huguenin, MD   237 mL at 10/20/18 1437  . heparin injection 5,000 Units  5,000 Units Subcutaneous Q8H Elgergawy, Silver Huguenin, MD   5,000 Units at 10/20/18 1437  . insulin aspart (novoLOG) injection 0-20 Units  0-20 Units Subcutaneous TID WC Elgergawy, Silver Huguenin, MD   20 Units at 10/20/18 1239  . insulin aspart (novoLOG) injection 0-5 Units  0-5 Units Subcutaneous QHS Hosie Poisson, MD   2 Units at 10/19/18 2101  . [START ON  10/21/2018] insulin glargine (LANTUS) injection 28 Units  28 Units Subcutaneous Daily Elgergawy, Dawood S, MD      . methylPREDNISolone sodium succinate (SOLU-MEDROL) 40 mg/mL injection 40 mg  40 mg Intravenous Q8H Elgergawy, Silver Huguenin, MD   40 mg at 10/20/18 1437  . pantoprazole (PROTONIX) EC tablet 40 mg  40 mg Oral Daily Elgergawy, Silver Huguenin, MD   40 mg at 10/20/18 1041  . vitamin C (ASCORBIC ACID) tablet 500 mg  500 mg Oral Daily Elgergawy, Silver Huguenin, MD   500 mg at 10/20/18 1041  . zinc sulfate capsule 220 mg  220 mg Oral Daily Elgergawy, Silver Huguenin, MD   220 mg at 10/20/18 1041     Discharge Medications: Please see discharge summary for a list of discharge medications.  Relevant Imaging Results:  Relevant Lab Results:   Additional Information    Ninfa Meeker, RN

## 2018-10-20 NOTE — Progress Notes (Signed)
PROGRESS NOTE                                                                                                                                                                                                             Patient Demographics:    Tony Vasquez, is a 85 y.o. male, DOB - April 30, 1932, [id number]  Admit date - 10/06/2018   Admitting Physician Hosie Poisson, MD  Outpatient Primary MD for the patient is [name], [name], MD  LOS - 3   Chief Complaint  Patient presents with  . Fatigue  . COVID       Brief Narrative    85 y.o. male with medical history significant of hypertension, type2diabetes,osteoarthritis,  Dementia recently diagnosed COVID-19 illness discharged home on 10/14/2018 , brought in for generalized weakness and fatigue, poor oral intake, low-grade fevers and difficulty walking.   Subjective:    [facility] today complaints, had good night sleep, had febrile yesterday afternoon at 100.8.   Assessment  & Plan :    Active Problems:   Dyslipidemia   Chronic kidney disease, unspecified   Benign essential HTN   Diabetes mellitus type 2, uncontrolled, with complications ([place])   [id number] virus infection  COVID-19 infection -Patient presents with fever, generalized weakness, poor oral intake, chest x-ray significant for bilateral opacity, significantly evaded inflammatory markers. -Patient remains with no oxygen requirement this morning, so far no indication for Remdesivir or Actemra. -Continue with IV Solu-Medrol. -Continue to trend inflammatory markers, RP remain significantly elevated at 19.5, ferritin is trending up, which is concerning, will continue on current dose Solu-Medrol with no taper.  Still with no indication for Actemra or Remdesivir given no hypoxia -Procalcitonin mildly elevated, broad-spectrum antibiotics, discontinue vancomycin, continue cefepime.   COVID-19 Labs  Recent Labs    10/18/18 0440  10/19/18 0600 10/20/18 0220  DDIMER 1.64* 1.62* 0.89*  FERRITIN 1,133* 1,625* 2,476*  CRP 14.4* 19.2* 19.5*    Lab Results  Component Value Date   SARSCOV2NAA POSITIVE (A) 10/04/2018   SARSCOV2NAA POSITIVE (A) 10/09/2018   Diabetes mellitus -Poorly controlled on sliding scale, will resume home dose Lantus 15 units subcu daily  Hypertension -Blood pressure acceptable, had couple of low readings yesterday, will discontinue his Norvasc.(his beta-blockers were stopped during previous admission secondary to bradycardia)  AKI on CKD stage III -Patient with significant  bump in his creatinine today, up to 2.6, he had couple episodes of hypotension yesterday, increase his fluid 100 cc/h, and hold antihypertensive regimen, recheck BMP in a.m..  UTI -Growing Proteus, currently on cefepime, will narrow to Rocephin in next 24 hours  Code Status : Full code  Family Communication  : Discussed with daughter via phone 9/2  Disposition Plan  : Will need SNF placement  Barriers For Discharge : Remains on IV antibiotics, IV steroids  Consults  :  None  Procedures  : None  DVT Prophylaxis  :  [place] heparin  Lab Results  Component Value Date   PLT 161 10/20/2018    Antibiotics  :    Anti-infectives (From admission, onward)   Start     Dose/Rate Route Frequency Ordered Stop   10/18/18 0800  vancomycin (VANCOCIN) IVPB 750 mg/150 ml premix  Status:  Discontinued     750 mg 150 mL/hr over 60 Minutes Intravenous Every 24 hours 09/29/2018 1152 10/19/18 1131   09/30/2018 1400  ceFEPIme (MAXIPIME) 2 g in sodium chloride 0.9 % 100 mL IVPB     2 g 200 mL/hr over 30 Minutes Intravenous Every 24 hours 10/15/2018 1152     10/12/2018 0630  vancomycin (VANCOCIN) 1,500 mg in sodium chloride 0.9 % 500 mL IVPB     1,500 mg 250 mL/hr over 120 Minutes Intravenous  Once 09/24/2018 0628 09/17/2018 0951   10/02/2018 0615  vancomycin (VANCOCIN) IVPB 1000 mg/200 mL premix  Status:  Discontinued     1,000 mg 200 mL/hr  over 60 Minutes Intravenous  Once 10/09/2018 0606 10/05/2018 0627   10/10/2018 0615  aztreonam (AZACTAM) 2 g in sodium chloride 0.9 % 100 mL IVPB     2 g 200 mL/hr over 30 Minutes Intravenous  Once 09/28/2018 0606 10/01/2018 0713        Objective:   Vitals:   10/19/18 1846 10/19/18 1954 10/20/18 0341 10/20/18 0810  BP:  115/62 116/64 (!) 159/68  Pulse: 98 94 76 98  Resp: 20 17 (!) 23   Temp: 99.5 F (37.5 C) 98.4 F (36.9 C) 99.3 F (37.4 C) 98.4 F (36.9 C)  TempSrc: Oral Oral Oral Oral  SpO2: 98% 99% 97% 95%  Weight:      Height:        Wt Readings from Last 3 Encounters:  09/18/2018 72.5 kg  10/13/18 76.1 kg  08/24/17 74 kg     Intake/Output Summary (Last 24 hours) at 10/20/2018 0955 Last data filed at 10/19/2018 1800 Gross per 24 hour  Intake 1180.72 ml  Output -  Net 1180.72 ml     Physical Exam  Awake, laying in bed, in no apparent distress, extremely frail Symmetrical Chest wall movement, Good air movement bilaterally, CTAB RRR,No Gallops,Rubs or new Murmurs, No Parasternal Heave +ve B.Sounds, Abd Soft, No tenderness, No rebound - guarding or rigidity. No Cyanosis, Clubbing or edema, No new Rash or bruise      Data Review:    CBC Recent Labs  Lab 10/14/18 0313 09/23/2018 0411 10/18/18 0440 10/19/18 0600 10/20/18 0220  WBC 11.0* 8.7 11.1* 13.5* 14.9*  HGB 14.5 14.5 15.0 14.4 14.0  HCT 43.4 43.5 44.3 42.6 42.8  PLT 144* 170 166 158 161  MCV 91.4 91.8 91.3 90.4 92.2  MCH 30.5 30.6 30.9 30.6 30.2  MCHC 33.4 33.3 33.9 33.8 32.7  RDW 12.9 13.3 13.2 13.0 13.5  LYMPHSABS 2.2 0.4* 0.7 0.6* 0.5*  MONOABS 0.7 0.6 0.4 0.3 0.2  EOSABS 0.1 0.0 0.0 0.0 0.0  BASOSABS 0.1 0.0 0.0 0.0 0.0    Chemistries  Recent Labs  Lab 10/14/18 0313 10/12/2018 0411 10/18/18 0440 10/19/18 0600 10/20/18 0220  NA 141 134* 133* 139 140  K 3.2* 5.0 4.1 3.9 4.1  CL 108 101 97* 106 107  CO2 25 21* 23 23 21*  GLUCOSE 168* 318* 153* 219* 241*  BUN 42* 48* 40* 48* 63*  CREATININE  1.78* 1.92* 1.74*  1.74* 2.05* 2.64*  CALCIUM 8.1* 9.2 8.7* 8.2* 8.0*  MG 1.8  --   --   --   --   AST 60* 26 19 21  39  ALT 85* 47* 32 28 34  ALKPHOS 51 49 41 40 43  BILITOT 0.4 0.9 1.0 0.9 0.7   ------------------------------------------------------------------------------------------------------------------ No results for input(s): CHOL, HDL, LDLCALC, TRIG, CHOLHDL, LDLDIRECT in the last 72 hours.  Lab Results  Component Value Date   HGBA1C 8.7 (H) 10/09/2018   ------------------------------------------------------------------------------------------------------------------ No results for input(s): TSH, T4TOTAL, T3FREE, THYROIDAB in the last 72 hours.  Invalid input(s): FREET3 ------------------------------------------------------------------------------------------------------------------ Recent Labs    10/19/18 0600 10/20/18 0220  FERRITIN 1,625* 2,476*    Coagulation profile No results for input(s): INR, PROTIME in the last 168 hours.  Recent Labs    10/19/18 0600 10/20/18 0220  DDIMER 1.62* 0.89*    Cardiac Enzymes No results for input(s): CKMB, TROPONINI, MYOGLOBIN in the last 168 hours.  Invalid input(s): CK ------------------------------------------------------------------------------------------------------------------ No results found for: BNP  Inpatient Medications  Scheduled Meds: . amLODipine  10 mg Oral Daily  . feeding supplement (ENSURE ENLIVE)  237 mL Oral TID BM  . heparin injection (subcutaneous)  5,000 Units Subcutaneous Q8H  . insulin aspart  0-20 Units Subcutaneous TID WC  . insulin aspart  0-5 Units Subcutaneous QHS  . insulin glargine  20 Units Subcutaneous Daily  . methylPREDNISolone (SOLU-MEDROL) injection  40 mg Intravenous Q8H  . pantoprazole  40 mg Oral Daily  . vitamin C  500 mg Oral Daily  . zinc sulfate  220 mg Oral Daily   Continuous Infusions: . sodium chloride Stopped (10/19/18 0812)  . ceFEPime (MAXIPIME) IV Stopped  (10/19/18 1411)   PRN Meds:.acetaminophen  Micro Results Recent Results (from the past 240 hour(s))  Urine culture     Status: Abnormal   Collection Time: 09/22/2018  4:11 AM   Specimen: In/Out Cath Urine  Result Value Ref Range Status   Specimen Description   Final    IN/OUT CATH URINE Performed at [facility], [place] [address] [place], [place] [address]    Special Requests   Final    NONE Performed at [facility], [place] [address], [place], [place] [address]    Culture 40,000 COLONIES/mL PROTEUS MIRABILIS (A)  Final   Report Status 10/19/2018 FINAL  Final   Organism ID, Bacteria PROTEUS MIRABILIS (A)  Final      Susceptibility   Proteus mirabilis - MIC*    AMPICILLIN <=2 SENSITIVE Sensitive     CEFAZOLIN <=4 SENSITIVE Sensitive     CEFTRIAXONE <=1 SENSITIVE Sensitive     CIPROFLOXACIN <=0.25 SENSITIVE Sensitive     GENTAMICIN <=1 SENSITIVE Sensitive     IMIPENEM 2 SENSITIVE Sensitive     NITROFURANTOIN 256 RESISTANT Resistant     TRIMETH/SULFA <=20 SENSITIVE Sensitive     AMPICILLIN/SULBACTAM <=2 SENSITIVE Sensitive     PIP/TAZO <=4 SENSITIVE Sensitive     * 40,000 COLONIES/mL PROTEUS MIRABILIS  SARS Coronavirus  2 [facility] order, Performed in [facility] hospital lab) Nasopharyngeal Nasopharyngeal Swab     Status: Abnormal   Collection Time: 10/09/2018  4:12 AM   Specimen: Nasopharyngeal Swab  Result Value Ref Range Status   SARS Coronavirus 2 POSITIVE (A) NEGATIVE Final    Comment: RESULT CALLED TO, READ BACK BY AND VERIFIED WITH: C.MOORE,RN [id number] @1048  BY V.WILKINS (NOTE) If result is NEGATIVE SARS-CoV-2 target nucleic acids are NOT DETECTED. The SARS-CoV-2 RNA is generally detectable in upper and lower  respiratory specimens during the acute phase of infection. The lowest  concentration of SARS-CoV-2 viral copies this assay can detect is 250  copies / mL. A negative result does not preclude SARS-CoV-2 infection  and should not be  used as the sole basis for treatment or other  patient management decisions.  A negative result may occur with  improper specimen collection / handling, submission of specimen other  than nasopharyngeal swab, presence of viral mutation(s) within the  areas targeted by this assay, and inadequate number of viral copies  (<250 copies / mL). A negative result must be combined with clinical  observations, patient history, and epidemiological information. If result is POSITIVE SARS-CoV-2 target nucleic acids are DETECTED.  The SARS-CoV-2 RNA is generally detectable in upper and lower  respiratory specimens during the acute phase of infection.  Positive  results are indicative of active infection with SARS-CoV-2.  Clinical  correlation with patient history and other diagnostic information is  necessary to determine patient infection status.  Positive results do  not rule out bacterial infection or co-infection with other viruses. If result is PRESUMPTIVE POSTIVE SARS-CoV-2 nucleic acids MAY BE PRESENT.   A presumptive positive result was obtained on the submitted specimen  and confirmed on repeat testing.  While 2019 novel coronavirus  (SARS-CoV-2) nucleic acids may be present in the submitted sample  additional confirmatory testing may be necessary for epidemiological  and / or clinical management purposes  to differentiate between  SARS-CoV-2 and other Sarbecovirus currently known to infect humans.  If clinically indicated additional testing with an alternate test  methodology [phone]) i s advised. The SARS-CoV-2 RNA is generally  detectable in upper and lower respiratory specimens during the acute  phase of infection. The expected result is Negative. Fact Sheet for Patients:  StrictlyIdeas.no Fact Sheet for Healthcare Providers: BankingDealers.co.za This test is not yet approved or cleared by the [place] FDA and has been authorized  for detection and/or diagnosis of SARS-CoV-2 by FDA under an Emergency Use Authorization (EUA).  This EUA will remain in effect (meaning this test can be used) for the duration of the COVID-19 declaration under Section 564(b)(1) of the Act, 21 U.S.C. section 360bbb-3(b)(1), unless the authorization is terminated or revoked sooner. Performed at [facility], [place] [address] [place], [place] [address]   Blood Culture (routine x 2)     Status: None (Preliminary result)   Collection Time: 09/30/2018  4:16 AM   Specimen: BLOOD  Result Value Ref Range Status   Specimen Description   Final    BLOOD BLOOD LEFT HAND Performed at [place] [address], [place], [place] [address]    Special Requests   Final    BOTTLES DRAWN AEROBIC ONLY Blood Culture results may not be optimal due to an inadequate volume of blood received in culture bottles Performed at [place] [address] [place], [place] [address]    Culture   Final  NO GROWTH 2 DAYS Performed at [facility], [place] [address] [place], [place] [address]    Report Status PENDING  Incomplete    Radiology Reports Ct Head Wo Contrast  Result Date: 10/01/2018 CLINICAL DATA:  Unexplained altered level consciousness. EXAM: CT HEAD WITHOUT CONTRAST TECHNIQUE: Contiguous axial images were obtained from the base of the skull through the vertex without intravenous contrast. COMPARISON:  10/09/2018 FINDINGS: Brain: No evidence of acute infarction, hemorrhage, hydrocephalus, extra-axial collection, or mass lesion/mass effect. Moderate diffuse cerebral atrophy and chronic small vessel disease again demonstrated. Vascular:  No hyperdense vessel or other acute findings. Skull: No evidence of fracture or other significant bone abnormality. Sinuses/Orbits:  No acute findings. Other: None. IMPRESSION: No acute intracranial abnormality. Stable cerebral atrophy and chronic small vessel  disease. Electronically Signed   By: Marlaine Hind M.D.   On: 10/08/2018 06:45   Ct Head Wo Contrast  Result Date: 10/09/2018 CLINICAL DATA:  Patient with right-sided weakness. EXAM: CT HEAD WITHOUT CONTRAST TECHNIQUE: Contiguous axial images were obtained from the base of the skull through the vertex without intravenous contrast. COMPARISON:  Brain CT 09/17/2016 FINDINGS: Brain: Ventricles and sulci are prominent compatible with atrophy. Motion artifact limits evaluation. No evidence for acute cortically based infarct, intracranial hemorrhage, mass lesion or mass-effect. Vascular: Unremarkable Skull: Intact Sinuses/Orbits: Paranasal sinuses are well aerated. Mastoid air cells are unremarkable. Other: None. IMPRESSION: No acute intracranial process. Atrophy and chronic microvascular ischemic changes. Electronically Signed   By: Lovey Newcomer M.D.   On: 10/09/2018 05:32   Dg Chest Port 1 View  Result Date: 09/20/2018 CLINICAL DATA:  COVID-19, fever EXAM: PORTABLE CHEST 1 VIEW COMPARISON:  Radiograph October 09, 2018 FINDINGS: Patchy consolidation silhouettes portion of the right hemidiaphragm with additional opacity in the left lung periphery. No pneumothorax or effusion. Cardiomediastinal contours are unremarkable for the portable technique. No acute osseous or soft tissue abnormality. Degenerative changes are present in the imaged spine and shoulders. IMPRESSION: Findings compatible with multifocal pneumonia and patient's known diagnosis of COVID-19 Electronically Signed   By: Lovena Le M.D.   On: 10/01/2018 05:55   Dg Chest Port 1 View  Result Date: 10/09/2018 CLINICAL DATA:  Generalized weakness, dementia EXAM: PORTABLE CHEST 1 VIEW COMPARISON:  08/23/2017 FINDINGS: Lungs are clear.  No pleural effusion or pneumothorax. The heart is normal in size. IMPRESSION: No evidence of acute cardiopulmonary disease. Electronically Signed   By: Julian Hy M.D.   On: 10/09/2018 13:40      Phillips Climes M.D on 10/20/2018 at 9:55 AM  Between 7am to 7pm - Pager - [phone]  After 7pm go to [email or web address] - password [facility]  [facility] -  Office  [phone]

## 2018-10-20 NOTE — Plan of Care (Signed)
Pt nad, vss this shift. Continues on IV solumedrol. Needs PT/OT for probable snf at discharge.

## 2018-10-20 NOTE — TOC Initial Note (Signed)
Transition of Care [facility]) - Initial/Assessment Note    Patient Details  Name: Tony Vasquez MRN: [id number] Date of Birth: November 14, 1932  [facility] CM/SW Contact:    Ninfa Meeker, RN Phone Number: [phone] (working remotely) 10/20/2018, 2:57 PM  Clinical Narrative:  85 yr old gentleman readmitted with COVID 19 symptoms.Being treated  Patient now requires SNF for shortterm rehab. Case manager contacted patient's daughter [name] to discuss options. [name] says she hopes her dad can go to [facility], he has been there before. Case manager completed FL2 and has entered it for MD to sign. Will fax to [place] and [place]. CM will continue to monitor.            Expected Discharge Plan: Skilled Nursing Facility Barriers to Discharge: Continued Medical Work up   Patient Goals and CMS Choice Patient states their goals for this hospitalization and ongoing recovery are:: per daughter to recover CMS Medicare.gov Compare Post Acute Care list provided to:: Patient Represenative (must comment)(daughter) Choice offered to / list presented to : Adult Children([name] Whitaker)  Expected Discharge Plan and Services Expected Discharge Plan: [place] In-house Referral: Clinical Social Work   Post Acute Care Choice: [place] Living arrangements for the past 2 months: [place] Expected Discharge Date: (unknown)                                    Prior Living Arrangements/Services Living arrangements for the past 2 months: Single Family Home Lives with:: Adult Children Patient language and need for interpreter reviewed:: Yes        Need for Family Participation in Patient Care: Yes (Comment) Care giver support system in place?: Yes (comment) Current home services: Home PT, Home OT Criminal Activity/Legal Involvement Pertinent to Current Situation/Hospitalization: No - Comment as needed  Activities of Daily  Living Home Assistive Devices/Equipment: Cane (specify quad or straight), CBG Meter, Shower chair without back, Walker (specify type), Other (Comment)(single point cane, front wheeled walker, tub/shower unit) ADL Screening (condition at time of admission) Patient's cognitive ability adequate to safely complete daily activities?: No Is the patient deaf or have difficulty hearing?: No Does the patient have difficulty seeing, even when wearing glasses/contacts?: No Does the patient have difficulty concentrating, remembering, or making decisions?: Yes Patient able to express need for assistance with ADLs?: Yes Does the patient have difficulty dressing or bathing?: Yes Independently performs ADLs?: No Communication: Independent Dressing (OT): Needs assistance Is this a change from baseline?: Pre-admission baseline Grooming: Needs assistance Is this a change from baseline?: Pre-admission baseline Feeding: Needs assistance Is this a change from baseline?: Pre-admission baseline Bathing: Needs assistance(son helps patient get doen in the tub) Is this a change from baseline?: Pre-admission baseline Toileting: Needs assistance Is this a change from baseline?: Change from baseline, expected to last >3days In/Out Bed: Needs assistance Is this a change from baseline?: Change from baseline, expected to last >3 days Walks in Home: Needs assistance Is this a change from baseline?: Change from baseline, expected to last >3 days Does the patient have difficulty walking or climbing stairs?: Yes Weakness of Legs: Both Weakness of Arms/Hands: Both  Permission Sought/Granted                  Emotional Assessment       Orientation: : Oriented to Self Alcohol / Substance Use: Not Applicable Psych Involvement: No (comment)  Admission diagnosis:  Healthcare-associated pneumonia [J18.9] Sepsis without acute organ dysfunction, due to unspecified organism ([place]) [A41.9] COVID-19 virus infection  [U07.1] Patient Active Problem List   Diagnosis Date Noted  . COVID-19 virus infection 09/26/2018  . QT prolongation 10/11/2018  . Sinus bradycardia 10/11/2018  . Diabetes mellitus type 2, uncontrolled, with complications ([place]) [id number]  . CKD (chronic kidney disease), stage III ([place]) 10/10/2018  . Benign essential HTN 10/10/2018  . Prostate cancer () 10/10/2018  . Generalized weakness 10/09/2018  . Hypoglycemia 08/23/2017  . Bradycardia   . Malignant neoplasm of prostate ([place]) 01/16/2014  . Secondary renovascular hypertension, benign 12/12/2012  . Chronic kidney disease, unspecified 12/12/2012  . Type II or unspecified type diabetes mellitus with renal manifestations, not stated as uncontrolled(250.40) 12/12/2012  . Osteoarthrosis, unspecified whether generalized or localized, unspecified site 11/24/2012  . Acute bronchitis 11/24/2012  . Fever 10/31/2012  . Rash 10/31/2012  . Intractable hiccups 10/31/2012  . Renal insufficiency, mild 10/31/2012  . Thrombocytopenia ([place]) 10/31/2012  . Normocytic anemia 10/31/2012  . DM (diabetes mellitus) ([place]) 10/31/2012  . HTN (hypertension) 10/31/2012  . Dyslipidemia 10/31/2012   PCP:  Haywood Pao, MD Pharmacy:   [facility] [place], [place] [address] Phone: [phone] Fax: [phone]     Social Determinants of Health (SDOH) Interventions    Readmission Risk Interventions No flowsheet data found.

## 2018-10-20 NOTE — Progress Notes (Signed)
Patient now requiring 6L o2 to maintain sats of 88%.  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  Prolonged QTC though I think it may be reading the P wave as the T wave in the ventricular leads so this may be an overestimate:  O2 requirement is new, looks like he wasn't requiring any as of earlier today per Dr. Graciela Husbands note.  1) start remdesivir per pharm 2) repeat CXR 3) already on steroids 4) Actemra considered; however, patient remains on cefepime with concern for HCAP, so not started at this time. 5) Stat: BMP, Mg, PO4, D.Dimer, trop, BNP 6) Patient with AKI on CKD, creat of 2.6, cant obtain CTA to r/o PE.  Depending on D.Dimer and CXR findings, may end up just starting him on empiric heparin gtt for possible PE.

## 2018-10-21 LAB — C-REACTIVE PROTEIN: CRP: 23.8 mg/dL — ABNORMAL HIGH (ref <1.0)

## 2018-10-21 LAB — TYPE AND SCREEN
ABO/RH(D): O POS
Antibody Screen: NEGATIVE

## 2018-10-21 LAB — COMPREHENSIVE METABOLIC PANEL
ALT: 48 U/L — ABNORMAL HIGH (ref 0–44)
AST: 60 U/L — ABNORMAL HIGH (ref 15–41)
Albumin: 2.4 g/dL — ABNORMAL LOW (ref 3.5–5.0)
Alkaline Phosphatase: 53 U/L (ref 38–126)
Anion gap: 15 (ref 5–15)
BUN: 76 mg/dL — ABNORMAL HIGH (ref 8–23)
CO2: 19 mmol/L — ABNORMAL LOW (ref 22–32)
Calcium: 7.9 mg/dL — ABNORMAL LOW (ref 8.9–10.3)
Chloride: 110 mmol/L (ref 98–111)
Creatinine, Ser: 3.04 mg/dL — ABNORMAL HIGH (ref 0.61–1.24)
GFR calc Af Amer: 21 mL/min — ABNORMAL LOW (ref >60)
GFR calc non Af Amer: 18 mL/min — ABNORMAL LOW (ref >60)
Glucose, Bld: 183 mg/dL — ABNORMAL HIGH (ref 70–99)
Potassium: 3.5 mmol/L (ref 3.5–5.1)
Sodium: 144 mmol/L (ref 135–145)
Total Bilirubin: 0.8 mg/dL (ref 0.3–1.2)
Total Protein: 6.6 g/dL (ref 6.5–8.1)

## 2018-10-21 LAB — BASIC METABOLIC PANEL
Anion gap: 13 (ref 5–15)
BUN: 76 mg/dL — ABNORMAL HIGH (ref 8–23)
CO2: 16 mmol/L — ABNORMAL LOW (ref 22–32)
Calcium: 7.7 mg/dL — ABNORMAL LOW (ref 8.9–10.3)
Chloride: 112 mmol/L — ABNORMAL HIGH (ref 98–111)
Creatinine, Ser: 3.1 mg/dL — ABNORMAL HIGH (ref 0.61–1.24)
GFR calc Af Amer: 20 mL/min — ABNORMAL LOW (ref >60)
GFR calc non Af Amer: 18 mL/min — ABNORMAL LOW (ref >60)
Glucose, Bld: 262 mg/dL — ABNORMAL HIGH (ref 70–99)
Potassium: 3.4 mmol/L — ABNORMAL LOW (ref 3.5–5.1)
Sodium: 141 mmol/L (ref 135–145)

## 2018-10-21 LAB — CBC WITH DIFFERENTIAL/PLATELET
Abs Immature Granulocytes: 0.25 10*3/uL — ABNORMAL HIGH (ref 0.00–0.07)
Basophils Absolute: 0 10*3/uL (ref 0.0–0.1)
Basophils Relative: 0 %
Eosinophils Absolute: 0 10*3/uL (ref 0.0–0.5)
Eosinophils Relative: 0 %
HCT: 41.7 % (ref 39.0–52.0)
Hemoglobin: 14 g/dL (ref 13.0–17.0)
Immature Granulocytes: 1 %
Lymphocytes Relative: 3 %
Lymphs Abs: 0.5 10*3/uL — ABNORMAL LOW (ref 0.7–4.0)
MCH: 30.1 pg (ref 26.0–34.0)
MCHC: 33.6 g/dL (ref 30.0–36.0)
MCV: 89.7 fL (ref 80.0–100.0)
Monocytes Absolute: 0.3 10*3/uL (ref 0.1–1.0)
Monocytes Relative: 1 %
Neutro Abs: 16.6 10*3/uL — ABNORMAL HIGH (ref 1.7–7.7)
Neutrophils Relative %: 95 %
Platelets: 180 10*3/uL (ref 150–400)
RBC: 4.65 MIL/uL (ref 4.22–5.81)
RDW: 13.6 % (ref 11.5–15.5)
WBC: 17.7 10*3/uL — ABNORMAL HIGH (ref 4.0–10.5)
nRBC: 0 % (ref 0.0–0.2)

## 2018-10-21 LAB — PROCALCITONIN
Procalcitonin: 1.48 ng/mL
Procalcitonin: 1.51 ng/mL

## 2018-10-21 LAB — LACTIC ACID, PLASMA
Lactic Acid, Venous: 3 mmol/L (ref 0.5–1.9)
Lactic Acid, Venous: 2.8 mmol/L (ref 0.5–1.9)

## 2018-10-21 LAB — TROPONIN I (HIGH SENSITIVITY)
Troponin I (High Sensitivity): 110 ng/L (ref <18)
Troponin I (High Sensitivity): 117 ng/L (ref <18)

## 2018-10-21 LAB — PHOSPHORUS: Phosphorus: 2.1 mg/dL — ABNORMAL LOW (ref 2.5–4.6)

## 2018-10-21 LAB — GLUCOSE, CAPILLARY
Glucose-Capillary: 286 mg/dL — ABNORMAL HIGH (ref 70–99)
Glucose-Capillary: 351 mg/dL — ABNORMAL HIGH (ref 70–99)
Glucose-Capillary: 213 mg/dL — ABNORMAL HIGH (ref 70–99)
Glucose-Capillary: 233 mg/dL — ABNORMAL HIGH (ref 70–99)

## 2018-10-21 LAB — D-DIMER, QUANTITATIVE: D-Dimer, Quant: 1.19 ug/mL-FEU — ABNORMAL HIGH (ref 0.00–0.50)

## 2018-10-21 LAB — MAGNESIUM: Magnesium: 2.4 mg/dL (ref 1.7–2.4)

## 2018-10-21 LAB — FERRITIN: Ferritin: 3351 ng/mL — ABNORMAL HIGH (ref 24–336)

## 2018-10-21 MED ORDER — VANCOMYCIN HCL 10 G IV SOLR
1500.0000 mg | Freq: Once | INTRAVENOUS | Status: AC
  Administered 2018-10-21: 09:00:00 1500 mg via INTRAVENOUS
  Filled 2018-10-21: qty 1500

## 2018-10-21 MED ORDER — VANCOMYCIN VARIABLE DOSE PER UNSTABLE RENAL FUNCTION (PHARMACIST DOSING): Status: DC

## 2018-10-21 MED ORDER — SODIUM CHLORIDE 0.9 % IV SOLN
2.0000 g | INTRAVENOUS | Status: DC
  Administered 2018-10-21 – 2018-10-22 (×2): 2 g via INTRAVENOUS
  Filled 2018-10-21 (×3): qty 2

## 2018-10-21 MED ORDER — TOCILIZUMAB 400 MG/20ML IV SOLN
8.0000 mg/kg | Freq: Once | INTRAVENOUS | Status: AC
  Administered 2018-10-21: 20:00:00 580 mg via INTRAVENOUS
  Filled 2018-10-21: qty 10

## 2018-10-21 MED ORDER — INSULIN GLARGINE 100 UNIT/ML ~~LOC~~ SOLN
35.0000 [IU] | Freq: Every day | SUBCUTANEOUS | Status: DC
  Administered 2018-10-22: 35 [IU] via SUBCUTANEOUS
  Filled 2018-10-21: qty 0.35

## 2018-10-21 MED ORDER — SODIUM CHLORIDE 0.9% IV SOLUTION: Freq: Once | INTRAVENOUS | Status: DC

## 2018-10-21 MED ORDER — INSULIN GLARGINE 100 UNIT/ML ~~LOC~~ SOLN
5.0000 [IU] | Freq: Once | SUBCUTANEOUS | Status: AC
  Administered 2018-10-21: 5 [IU] via SUBCUTANEOUS
  Filled 2018-10-21: qty 0.05

## 2018-10-21 MED ORDER — TOCILIZUMAB 400 MG/20ML IV SOLN: 8.0000 mg/kg | Freq: Once | INTRAVENOUS | Status: DC

## 2018-10-21 MED ORDER — SODIUM CHLORIDE 0.9 % IV SOLN
INTRAVENOUS | Status: DC
  Administered 2018-10-21: 12:00:00 via INTRAVENOUS

## 2018-10-21 MED ORDER — POLYETHYLENE GLYCOL 3350 17 G PO PACK
34.0000 g | PACK | Freq: Once | ORAL | Status: AC
  Administered 2018-10-21: 15:00:00 34 g via ORAL
  Filled 2018-10-21: qty 2

## 2018-10-21 NOTE — Progress Notes (Addendum)
PROGRESS NOTE                                                                                                                                                                                                             Patient Demographics:    Tony Vasquez, is a 85 y.o. male, DOB - 12-Sep-1932, YE:[id number]  Admit date - 10/08/2018   Admitting Physician Hosie Poisson, MD  Outpatient Primary MD for the patient is [name], [name], MD  LOS - 4   Chief Complaint  Patient presents with  . Fatigue  . COVID       Brief Narrative    85 y.o. male with medical history significant of hypertension, type2diabetes,osteoarthritis,  Dementia recently diagnosed COVID-19 illness discharged home on 10/14/2018 , brought in for generalized weakness and fatigue, poor oral intake, low-grade fevers and difficulty walking.  Patient with elevated inflammatory markers, initially with no hypoxia, initiating steroids, 9/3 overnight he developed some hypoxia, currently requiring 6 L nasal cannula.   Subjective:    [facility] developed hypoxia/dyspnea overnight, will he is currently on 6 L nasal cannula, he himself denies any complaints today .   Assessment  & Plan :    Active Problems:   Dyslipidemia   Chronic kidney disease, unspecified   Benign essential HTN   Diabetes mellitus type 2, uncontrolled, with complications ([place])   [id number] virus infection  Hypoxic respiratory failure due to COVID-19 pneumonia -Patient presents with fever, generalized weakness, poor oral intake, chest x-ray significant for bilateral opacity, significantly evaded inflammatory markers.  Initially with no hypoxia, but 9/3 overnight he developed hypoxia, this morning he is on 6 L nasal cannula. -Patient will be started on Remdesivir given hypoxia and opacities in CXR. -Discussed with daughter, will proceed with convulsant plasma transfusion I have obtained IRB consent, she reviewed EUA  cfact sheet as well. -Continue to monitor inflammatory markers closely -Continue with IV Solu-Medrol. -Given  his procalcitonin is trending up, I will reinitiate his broad spectrumantibiotic  Addendum: -Patient had significant increase of oxygen requirement during the day, he is currently on 12 L nasal cannula, he already received his convulsant plasma, I have discussed with daughter at length, patient is deteriorating rapidly, at high risk of intubation, discussed all available options, discussed Actemra with her, I have explained risks and benefits, and possible side  effects of Actemra including worsening infections, daughter is agreeable to try Actemra if felt needed, I will recheck procalcitonin level, as long stable will proceed with Actemra.   COVID-19 Labs  Recent Labs    10/19/18 0600 10/20/18 0220 10/20/18 2201 10/21/18 0001  DDIMER 1.62* 0.89* 1.18* 1.19*  FERRITIN 1,625* 2,476*  --  3,351*  CRP 19.2* 19.5*  --  23.8*    Lab Results  Component Value Date   SARSCOV2NAA POSITIVE (A) 09/20/2018   SARSCOV2NAA POSITIVE (A) 10/09/2018   Diabetes mellitus -Remains poorly controlled despite increasing his Lantus yesterday, increased to 35 units today, continue with insulin sliding scale, likely uncontrolled in the setting of IV steroids. -Poorly controlled on sliding scale, will resume home dose Lantus 15 units subcu daily  Hypertension -Blood pressure acceptable, had couple of low readings yesterday, will discontinue his Norvasc.(his beta-blockers were stopped during previous admission secondary to bradycardia)  AKI on CKD stage III -Patient creatinine significantly increased to 3 today, baseline around 2, avoid nephrotoxic medication and continue with IV fluids .  UTI -Growing Proteus, treated with IV antibiotics  Elevated Troponin -Patient denies any chest pain, delta is less than 20, this is in the setting of demand ischemia from his hypoxia  Code Status : Full code   Family Communication  : Discussed with daughter via phone 9/2  Disposition Plan  : Will need SNF placement  Barriers For Discharge : Remains on IV antibiotics, IV steroids  Consults  :  None  Procedures  : None  DVT Prophylaxis  :  [place] heparin  Lab Results  Component Value Date   PLT 180 10/21/2018    Antibiotics  :    Anti-infectives (From admission, onward)   Start     Dose/Rate Route Frequency Ordered Stop   10/21/18 2200  remdesivir 100 mg in sodium chloride 0.9 % 250 mL IVPB     100 mg 500 mL/hr over 30 Minutes Intravenous Every 24 hours 10/20/18 2230 10/25/18 2159   10/21/18 1400  cefTRIAXone (ROCEPHIN) 2 g in sodium chloride 0.9 % 100 mL IVPB  Status:  Discontinued     2 g 200 mL/hr over 30 Minutes Intravenous  Once 10/20/18 1646 10/21/18 0757   10/21/18 0830  ceFEPIme (MAXIPIME) 2 g in sodium chloride 0.9 % 100 mL IVPB     2 g 200 mL/hr over 30 Minutes Intravenous Every 24 hours 10/21/18 0757     10/21/18 0830  vancomycin (VANCOCIN) 1,500 mg in sodium chloride 0.9 % 500 mL IVPB     1,500 mg 250 mL/hr over 120 Minutes Intravenous  Once 10/21/18 0757     10/20/18 2315  remdesivir 200 mg in sodium chloride 0.9 % 250 mL IVPB     200 mg 500 mL/hr over 30 Minutes Intravenous Once 10/20/18 2230 10/21/18 0025   10/18/18 0800  vancomycin (VANCOCIN) IVPB 750 mg/150 ml premix  Status:  Discontinued     750 mg 150 mL/hr over 60 Minutes Intravenous Every 24 hours 09/27/2018 1152 10/19/18 1131   09/23/2018 1400  ceFEPIme (MAXIPIME) 2 g in sodium chloride 0.9 % 100 mL IVPB  Status:  Discontinued     2 g 200 mL/hr over 30 Minutes Intravenous Every 24 hours 10/14/2018 1152 10/20/18 1646   10/05/2018 0630  vancomycin (VANCOCIN) 1,500 mg in sodium chloride 0.9 % 500 mL IVPB     1,500 mg 250 mL/hr over 120 Minutes Intravenous  Once 09/22/2018 0628 09/29/2018 0951   10/16/2018 0615  vancomycin (VANCOCIN) IVPB 1000 mg/200 mL premix  Status:  Discontinued     1,000 mg 200 mL/hr over 60 Minutes  Intravenous  Once 09/24/2018 0606 10/09/2018 0627   09/24/2018 0615  aztreonam (AZACTAM) 2 g in sodium chloride 0.9 % 100 mL IVPB     2 g 200 mL/hr over 30 Minutes Intravenous  Once 10/09/2018 0606 09/26/2018 0713        Objective:   Vitals:   10/21/18 0200 10/21/18 0300 10/21/18 0400 10/21/18 0825  BP:   120/67 135/74  Pulse: 84 79 81 87  Resp:    18  Temp:   97.9 F (36.6 C) 97.7 F (36.5 C)  TempSrc:   Oral Oral  SpO2: 94% 93% 100% 95%  Weight:      Height:        Wt Readings from Last 3 Encounters:  10/16/2018 72.5 kg  10/13/18 76.1 kg  08/24/17 74 kg     Intake/Output Summary (Last 24 hours) at 10/21/2018 1046 Last data filed at 10/20/2018 1600 Gross per 24 hour  Intake 850 ml  Output -  Net 850 ml     Physical Exam  Awake Alert, pleasant, laying in bed eating breakfast with assistance, in no apparent distress Symmetrical Chest wall movement, Good air movement bilaterally, CTAB RRR,No Gallops,Rubs or new Murmurs, No Parasternal Heave +ve B.Sounds, Abd Soft, No tenderness, No rebound - guarding or rigidity. No Cyanosis, Clubbing or edema, No new Rash or bruise       Data Review:    CBC Recent Labs  Lab 10/13/2018 0411 10/18/18 0440 10/19/18 0600 10/20/18 0220 10/21/18 0001  WBC 8.7 11.1* 13.5* 14.9* 17.7*  HGB 14.5 15.0 14.4 14.0 14.0  HCT 43.5 44.3 42.6 42.8 41.7  PLT 170 166 158 161 180  MCV 91.8 91.3 90.4 92.2 89.7  MCH 30.6 30.9 30.6 30.2 30.1  MCHC 33.3 33.9 33.8 32.7 33.6  RDW 13.3 13.2 13.0 13.5 13.6  LYMPHSABS 0.4* 0.7 0.6* 0.5* 0.5*  MONOABS 0.6 0.4 0.3 0.2 0.3  EOSABS 0.0 0.0 0.0 0.0 0.0  BASOSABS 0.0 0.0 0.0 0.0 0.0    Chemistries  Recent Labs  Lab 10/06/2018 0411 10/18/18 0440 10/19/18 0600 10/20/18 0220 10/20/18 2201 10/21/18 0001  NA 134* 133* 139 140 141 144  K 5.0 4.1 3.9 4.1 3.4* 3.5  CL 101 97* 106 107 112* 110  CO2 21* 23 23 21* 16* 19*  GLUCOSE 318* 153* 219* 241* 262* 183*  BUN 48* 40* 48* 63* 76* 76*  CREATININE 1.92*  1.74*  1.74* 2.05* 2.64* 3.10* 3.04*  CALCIUM 9.2 8.7* 8.2* 8.0* 7.7* 7.9*  MG  --   --   --   --  2.4  --   AST 26 19 21  39  --  60*  ALT 47* 32 28 34  --  48*  ALKPHOS 49 41 40 43  --  53  BILITOT 0.9 1.0 0.9 0.7  --  0.8   ------------------------------------------------------------------------------------------------------------------ No results for input(s): CHOL, HDL, LDLCALC, TRIG, CHOLHDL, LDLDIRECT in the last 72 hours.  Lab Results  Component Value Date   HGBA1C 8.7 (H) 10/09/2018   ------------------------------------------------------------------------------------------------------------------ No results for input(s): TSH, T4TOTAL, T3FREE, THYROIDAB in the last 72 hours.  Invalid input(s): FREET3 ------------------------------------------------------------------------------------------------------------------ Recent Labs    10/20/18 0220 10/21/18 0001  FERRITIN 2,476* 3,351*    Coagulation profile No results for input(s): INR, PROTIME in the last 168 hours.  Recent Labs    10/20/18 2201 10/21/18  0001  DDIMER 1.18* 1.19*    Cardiac Enzymes No results for input(s): CKMB, TROPONINI, MYOGLOBIN in the last 168 hours.  Invalid input(s): CK ------------------------------------------------------------------------------------------------------------------    Component Value Date/Time   BNP 106.9 (H) 10/20/2018 2201    Inpatient Medications  Scheduled Meds: . sodium chloride   Intravenous Once  . feeding supplement (ENSURE ENLIVE)  237 mL Oral TID BM  . heparin injection (subcutaneous)  5,000 Units Subcutaneous Q8H  . insulin aspart  0-20 Units Subcutaneous TID WC  . insulin aspart  0-5 Units Subcutaneous QHS  . insulin glargine  28 Units Subcutaneous Daily  . methylPREDNISolone (SOLU-MEDROL) injection  40 mg Intravenous Q8H  . pantoprazole  40 mg Oral Daily  . polyethylene glycol  34 g Oral Once  . vitamin C  500 mg Oral Daily  . zinc sulfate  220 mg  Oral Daily   Continuous Infusions: . sodium chloride    . ceFEPime (MAXIPIME) IV 2 g (10/21/18 0930)  . remdesivir 100 mg in NS 250 mL    . vancomycin 1,500 mg (10/21/18 0929)   PRN Meds:.acetaminophen  Micro Results Recent Results (from the past 240 hour(s))  Urine culture     Status: Abnormal   Collection Time: 10/07/2018  4:11 AM   Specimen: In/Out Cath Urine  Result Value Ref Range Status   Specimen Description   Final    IN/OUT CATH URINE Performed at [facility], [place] [address], [place], [place] [address]    Special Requests   Final    NONE Performed at [facility], [place] [address] [place], [place] [address]    Culture 40,000 COLONIES/mL PROTEUS MIRABILIS (A)  Final   Report Status 10/19/2018 FINAL  Final   Organism ID, Bacteria PROTEUS MIRABILIS (A)  Final      Susceptibility   Proteus mirabilis - MIC*    AMPICILLIN <=2 SENSITIVE Sensitive     CEFAZOLIN <=4 SENSITIVE Sensitive     CEFTRIAXONE <=1 SENSITIVE Sensitive     CIPROFLOXACIN <=0.25 SENSITIVE Sensitive     GENTAMICIN <=1 SENSITIVE Sensitive     IMIPENEM 2 SENSITIVE Sensitive     NITROFURANTOIN 256 RESISTANT Resistant     TRIMETH/SULFA <=20 SENSITIVE Sensitive     AMPICILLIN/SULBACTAM <=2 SENSITIVE Sensitive     PIP/TAZO <=4 SENSITIVE Sensitive     * 40,000 COLONIES/mL PROTEUS MIRABILIS  SARS Coronavirus 2 [facility] order, Performed in [facility] hospital lab) Nasopharyngeal Nasopharyngeal Swab     Status: Abnormal   Collection Time: 10/01/2018  4:12 AM   Specimen: Nasopharyngeal Swab  Result Value Ref Range Status   SARS Coronavirus 2 POSITIVE (A) NEGATIVE Final    Comment: RESULT CALLED TO, READ BACK BY AND VERIFIED WITH: C.MOORE,RN [id number] @1048  BY V.WILKINS (NOTE) If result is NEGATIVE SARS-CoV-2 target nucleic acids are NOT DETECTED. The SARS-CoV-2 RNA is generally detectable in upper and lower  respiratory specimens during the acute phase of infection. The  lowest  concentration of SARS-CoV-2 viral copies this assay can detect is 250  copies / mL. A negative result does not preclude SARS-CoV-2 infection  and should not be used as the sole basis for treatment or other  patient management decisions.  A negative result may occur with  improper specimen collection / handling, submission of specimen other  than nasopharyngeal swab, presence of viral mutation(s) within the  areas targeted by this assay, and inadequate number of viral copies  (<250 copies / mL). A negative result  must be combined with clinical  observations, patient history, and epidemiological information. If result is POSITIVE SARS-CoV-2 target nucleic acids are DETECTED.  The SARS-CoV-2 RNA is generally detectable in upper and lower  respiratory specimens during the acute phase of infection.  Positive  results are indicative of active infection with SARS-CoV-2.  Clinical  correlation with patient history and other diagnostic information is  necessary to determine patient infection status.  Positive results do  not rule out bacterial infection or co-infection with other viruses. If result is PRESUMPTIVE POSTIVE SARS-CoV-2 nucleic acids MAY BE PRESENT.   A presumptive positive result was obtained on the submitted specimen  and confirmed on repeat testing.  While 2019 novel coronavirus  (SARS-CoV-2) nucleic acids may be present in the submitted sample  additional confirmatory testing may be necessary for epidemiological  and / or clinical management purposes  to differentiate between  SARS-CoV-2 and other Sarbecovirus currently known to infect humans.  If clinically indicated additional testing with an alternate test  methodology [phone]) i s advised. The SARS-CoV-2 RNA is generally  detectable in upper and lower respiratory specimens during the acute  phase of infection. The expected result is Negative. Fact Sheet for Patients:  StrictlyIdeas.no  Fact Sheet for Healthcare Providers: BankingDealers.co.za This test is not yet approved or cleared by the [place] FDA and has been authorized for detection and/or diagnosis of SARS-CoV-2 by FDA under an Emergency Use Authorization (EUA).  This EUA will remain in effect (meaning this test can be used) for the duration of the COVID-19 declaration under Section 564(b)(1) of the Act, 21 U.S.C. section 360bbb-3(b)(1), unless the authorization is terminated or revoked sooner. Performed at [facility], [place] [address], [place], [place] [address]   Blood Culture (routine x 2)     Status: None (Preliminary result)   Collection Time: 10/03/2018  4:16 AM   Specimen: BLOOD  Result Value Ref Range Status   Specimen Description   Final    BLOOD BLOOD LEFT HAND Performed at [place] [address], [place], [place] [address]    Special Requests   Final    BOTTLES DRAWN AEROBIC ONLY Blood Culture results may not be optimal due to an inadequate volume of blood received in culture bottles Performed at [place] [address], [place], [place] [address]    Culture   Final    NO GROWTH 3 DAYS Performed at [facility], [place] [address], [place], [place] [address]    Report Status PENDING  Incomplete    Radiology Reports Ct Head Wo Contrast  Result Date: 10/01/2018 CLINICAL DATA:  Unexplained altered level consciousness. EXAM: CT HEAD WITHOUT CONTRAST TECHNIQUE: Contiguous axial images were obtained from the base of the skull through the vertex without intravenous contrast. COMPARISON:  10/09/2018 FINDINGS: Brain: No evidence of acute infarction, hemorrhage, hydrocephalus, extra-axial collection, or mass lesion/mass effect. Moderate diffuse cerebral atrophy and chronic small vessel disease again demonstrated. Vascular:  No hyperdense vessel or other acute findings. Skull: No evidence of fracture or other  significant bone abnormality. Sinuses/Orbits:  No acute findings. Other: None. IMPRESSION: No acute intracranial abnormality. Stable cerebral atrophy and chronic small vessel disease. Electronically Signed   By: Marlaine Hind M.D.   On: 10/10/2018 06:45   Ct Head Wo Contrast  Result Date: 10/09/2018 CLINICAL DATA:  Patient with right-sided weakness. EXAM: CT HEAD WITHOUT CONTRAST TECHNIQUE: Contiguous axial images were obtained from the base of the skull through  the vertex without intravenous contrast. COMPARISON:  Brain CT 09/17/2016 FINDINGS: Brain: Ventricles and sulci are prominent compatible with atrophy. Motion artifact limits evaluation. No evidence for acute cortically based infarct, intracranial hemorrhage, mass lesion or mass-effect. Vascular: Unremarkable Skull: Intact Sinuses/Orbits: Paranasal sinuses are well aerated. Mastoid air cells are unremarkable. Other: None. IMPRESSION: No acute intracranial process. Atrophy and chronic microvascular ischemic changes. Electronically Signed   By: Lovey Newcomer M.D.   On: 10/09/2018 05:32   Dg Chest Port 1 View  Result Date: 10/20/2018 CLINICAL DATA:  Dyspnea EXAM: PORTABLE CHEST 1 VIEW COMPARISON:  10/05/2018 FINDINGS: Interval worsening of left greater than right bilateral ground-glass opacity and consolidations consistent with history of COVID-19 pneumonia. No pleural effusion. Stable cardiomediastinal silhouette. No pneumothorax. IMPRESSION: Significant interval worsening of left greater than right pulmonary airspace disease consistent with pneumonia. Electronically Signed   By: Donavan Foil M.D.   On: 10/20/2018 22:38   Dg Chest Port 1 View  Result Date: 09/17/2018 CLINICAL DATA:  COVID-19, fever EXAM: PORTABLE CHEST 1 VIEW COMPARISON:  Radiograph October 09, 2018 FINDINGS: Patchy consolidation silhouettes portion of the right hemidiaphragm with additional opacity in the left lung periphery. No pneumothorax or effusion. Cardiomediastinal contours are  unremarkable for the portable technique. No acute osseous or soft tissue abnormality. Degenerative changes are present in the imaged spine and shoulders. IMPRESSION: Findings compatible with multifocal pneumonia and patient's known diagnosis of COVID-19 Electronically Signed   By: Lovena Le M.D.   On: 10/12/2018 05:55   Dg Chest Port 1 View  Result Date: 10/09/2018 CLINICAL DATA:  Generalized weakness, dementia EXAM: PORTABLE CHEST 1 VIEW COMPARISON:  08/23/2017 FINDINGS: Lungs are clear.  No pleural effusion or pneumothorax. The heart is normal in size. IMPRESSION: No evidence of acute cardiopulmonary disease. Electronically Signed   By: Julian Hy M.D.   On: 10/09/2018 13:40      Phillips Climes M.D on 10/21/2018 at 10:46 AM  Between 7am to 7pm - Pager - [phone]  After 7pm go to [email or web address] - password [facility]  [facility] -  Office  [phone]

## 2018-10-21 NOTE — Progress Notes (Signed)
Spoke with pt daughter [name] about pt condition and plan of care. Answered questions and concerns. She requested to speak with Dr Waldron Labs and sent him a message to make him aware.

## 2018-10-21 NOTE — Progress Notes (Signed)
CRITICAL VALUE ALERT  Critical Value:  Lactic acid 3.0   Date & Time Notied:  10/21/18 5:26 PM  Provider Notified: Paged Dr. Waldron Labs  Orders Received/Actions taken: Awaiting new orders

## 2018-10-21 NOTE — Progress Notes (Signed)
Pharmacy Antibiotic Note  Tony Vasquez is a 85 y.o. male admitted on 10/02/2018 with pneumonia.  Pharmacy has been consulted for Cefepime and vancomycin dosing. Leukocytosis has worsened with associated hypoxia. SCr is acutely elevated at 3.04. PCT 1.48. CRP 23.8   Plan: -Start cefepime 2 gm IV Q 24 hours -Vancomycin 1500 mg IV once, then dose per levels -Monitor CBC, renal fx, cultures and clinical progress   Height: 5\' 8"  (172.7 cm) Weight: 159 lb 13.3 oz (72.5 kg) IBW/kg (Calculated) : 68.4  Temp (24hrs), Avg:99.4 F (37.4 C), Min:97.9 F (36.6 C), Max:102 F (38.9 C)  Recent Labs  Lab 10/16/2018 0411 10/07/2018 0608 10/10/2018 1207 10/18/18 0440 10/19/18 0600 10/20/18 0220 10/20/18 2201 10/21/18 0001  WBC 8.7  --   --  11.1* 13.5* 14.9*  --  17.7*  CREATININE 1.92*  --   --  1.74*  1.74* 2.05* 2.64* 3.10* 3.04*  LATICACIDVEN 2.5* 3.4* 1.1  --   --   --   --   --     Estimated Creatinine Clearance: 17.8 mL/min (A) (by C-G formula based on SCr of 3.04 mg/dL (H)).    Allergies  Allergen Reactions  . Shrimp [Shellfish Allergy] Anaphylaxis  . 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 Was difficult to determine if it was do to Augmentin or both beta lactam agents.    Antimicrobials this admission: Cefepime 8/31 >>  Vanc 9/4 >>   Dose adjustments this admission: None   Microbiology results: 9/4 BCx: ngtd 8/31 UCx: 40k proteus    Thank you for allowing pharmacy to be a part of this patient's care.  Albertina Parr, PharmD., BCPS Clinical Pharmacist Clinical phone for 10/21/18 until 5pm: [phone]

## 2018-10-21 NOTE — Progress Notes (Signed)
Tony Drought MD alerted of Troponin of 110. Instructed to trend troponin and continue with current POC. Pt remains pain free, VSS at this time.

## 2018-10-21 NOTE — Progress Notes (Signed)
Dr. Alcario Drought notified of Trop 117.

## 2018-10-21 NOTE — Progress Notes (Signed)
Attempted to help pt lie prone and pt unable to tolerate. Assisted pt to lie on left side as close to prone as possible. Pt tolerating that position well with O2 sats 96% on 4L O2. Encouraged pt to hold position for as long as possible. Will continue to monitor.

## 2018-10-21 NOTE — Progress Notes (Addendum)
CRITICAL VALUE ALERT  Critical Value:  Troponin 110  Date & Time Notied:  10/21/18 0017  Provider Notified: Primary Rn Nikki notified of result  Orders Received/Actions taken: RN notified provider Dr Charna Elizabeth new orders

## 2018-10-21 NOTE — Progress Notes (Signed)
Pharmacy Brief Note   O:  ALT: 34 CXR: Interval worsening of left greater than right bilateral ground-glass opacity and consolidations  SpO2: 6L o2 to maintain sats of 88%   A/P:  Patient meets requirements for remdesivir therapy .  Will start  remdesivir 200 mg IV x 1  followed by 100 mg IV daily x 4 days.  Monitor ALT  Royetta Asal, PharmD, BCPS 10/21/2018 12:49 AM

## 2018-10-22 ENCOUNTER — Inpatient Hospital Stay (HOSPITAL_COMMUNITY): Payer: Medicare Other | Admitting: Certified Registered Nurse Anesthetist

## 2018-10-22 ENCOUNTER — Other Ambulatory Visit: Payer: Self-pay

## 2018-10-22 ENCOUNTER — Inpatient Hospital Stay (HOSPITAL_COMMUNITY): Payer: Medicare Other

## 2018-10-22 DIAGNOSIS — N179 Acute kidney failure, unspecified: Secondary | ICD-10-CM

## 2018-10-22 DIAGNOSIS — J069 Acute upper respiratory infection, unspecified: Secondary | ICD-10-CM

## 2018-10-22 LAB — CULTURE, BLOOD (ROUTINE X 2): Culture: NO GROWTH

## 2018-10-22 LAB — CBC WITH DIFFERENTIAL/PLATELET
Abs Immature Granulocytes: 0.17 10*3/uL — ABNORMAL HIGH (ref 0.00–0.07)
Basophils Absolute: 0 10*3/uL (ref 0.0–0.1)
Basophils Relative: 0 %
Eosinophils Absolute: 0 10*3/uL (ref 0.0–0.5)
Eosinophils Relative: 0 %
HCT: 39.6 % (ref 39.0–52.0)
Hemoglobin: 13.3 g/dL (ref 13.0–17.0)
Immature Granulocytes: 1 %
Lymphocytes Relative: 3 %
Lymphs Abs: 0.4 10*3/uL — ABNORMAL LOW (ref 0.7–4.0)
MCH: 30 pg (ref 26.0–34.0)
MCHC: 33.6 g/dL (ref 30.0–36.0)
MCV: 89.2 fL (ref 80.0–100.0)
Monocytes Absolute: 0.2 10*3/uL (ref 0.1–1.0)
Monocytes Relative: 1 %
Neutro Abs: 12.3 10*3/uL — ABNORMAL HIGH (ref 1.7–7.7)
Neutrophils Relative %: 95 %
Platelets: 189 10*3/uL (ref 150–400)
RBC: 4.44 MIL/uL (ref 4.22–5.81)
RDW: 13.9 % (ref 11.5–15.5)
WBC: 13 10*3/uL — ABNORMAL HIGH (ref 4.0–10.5)
nRBC: 0 % (ref 0.0–0.2)

## 2018-10-22 LAB — GLUCOSE, CAPILLARY
Glucose-Capillary: 320 mg/dL — ABNORMAL HIGH (ref 70–99)
Glucose-Capillary: 354 mg/dL — ABNORMAL HIGH (ref 70–99)
Glucose-Capillary: 312 mg/dL — ABNORMAL HIGH (ref 70–99)

## 2018-10-22 LAB — COMPREHENSIVE METABOLIC PANEL
ALT: 64 U/L — ABNORMAL HIGH (ref 0–44)
AST: 81 U/L — ABNORMAL HIGH (ref 15–41)
Albumin: 2.3 g/dL — ABNORMAL LOW (ref 3.5–5.0)
Alkaline Phosphatase: 70 U/L (ref 38–126)
Anion gap: 11 (ref 5–15)
BUN: 89 mg/dL — ABNORMAL HIGH (ref 8–23)
CO2: 17 mmol/L — ABNORMAL LOW (ref 22–32)
Calcium: 7.3 mg/dL — ABNORMAL LOW (ref 8.9–10.3)
Chloride: 111 mmol/L (ref 98–111)
Creatinine, Ser: 3.33 mg/dL — ABNORMAL HIGH (ref 0.61–1.24)
GFR calc Af Amer: 19 mL/min — ABNORMAL LOW (ref >60)
GFR calc non Af Amer: 16 mL/min — ABNORMAL LOW (ref >60)
Glucose, Bld: 324 mg/dL — ABNORMAL HIGH (ref 70–99)
Potassium: 4.1 mmol/L (ref 3.5–5.1)
Sodium: 139 mmol/L (ref 135–145)
Total Bilirubin: 1 mg/dL (ref 0.3–1.2)
Total Protein: 6.3 g/dL — ABNORMAL LOW (ref 6.5–8.1)

## 2018-10-22 LAB — VANCOMYCIN, RANDOM: Vancomycin Rm: 20

## 2018-10-22 LAB — PROCALCITONIN: Procalcitonin: 1.36 ng/mL

## 2018-10-22 LAB — BRAIN NATRIURETIC PEPTIDE: B Natriuretic Peptide: 228.7 pg/mL — ABNORMAL HIGH (ref 0.0–100.0)

## 2018-10-22 LAB — CREATININE, URINE, RANDOM: Creatinine, Urine: 30.97 mg/dL

## 2018-10-22 LAB — FERRITIN: Ferritin: 4563 ng/mL — ABNORMAL HIGH (ref 24–336)

## 2018-10-22 LAB — C-REACTIVE PROTEIN: CRP: 22.6 mg/dL — ABNORMAL HIGH (ref <1.0)

## 2018-10-22 LAB — D-DIMER, QUANTITATIVE: D-Dimer, Quant: 1.16 ug/mL-FEU — ABNORMAL HIGH (ref 0.00–0.50)

## 2018-10-22 LAB — SODIUM, URINE, RANDOM: Sodium, Ur: 83 mmol/L

## 2018-10-22 MED ORDER — GLUCERNA 1.2 CAL PO LIQD
1000.0000 mL | ORAL | Status: DC
  Filled 2018-10-22: qty 1000

## 2018-10-22 MED ORDER — GLUCERNA SHAKE PO LIQD
237.0000 mL | Freq: Three times a day (TID) | ORAL | Status: DC
  Administered 2018-10-22 (×2): 237 mL via ORAL
  Filled 2018-10-22 (×5): qty 237

## 2018-10-22 MED ORDER — VANCOMYCIN HCL IN DEXTROSE 750-5 MG/150ML-% IV SOLN
750.0000 mg | Freq: Once | INTRAVENOUS | Status: AC
  Administered 2018-10-22: 18:00:00 750 mg via INTRAVENOUS
  Filled 2018-10-22: qty 150

## 2018-10-22 MED ORDER — INSULIN ASPART 100 UNIT/ML ~~LOC~~ SOLN
6.0000 [IU] | Freq: Three times a day (TID) | SUBCUTANEOUS | Status: DC
  Administered 2018-10-22 (×2): 6 [IU] via SUBCUTANEOUS

## 2018-10-22 MED ORDER — SENNOSIDES-DOCUSATE SODIUM 8.6-50 MG PO TABS
3.0000 | ORAL_TABLET | Freq: Two times a day (BID) | ORAL | Status: DC
  Administered 2018-10-22: 12:00:00 3 via ORAL
  Filled 2018-10-22: qty 3

## 2018-10-22 MED ORDER — BISACODYL 10 MG RE SUPP: 10.0000 mg | Freq: Two times a day (BID) | RECTAL | Status: DC

## 2018-10-22 MED ORDER — INSULIN GLARGINE 100 UNIT/ML ~~LOC~~ SOLN
10.0000 [IU] | Freq: Every day | SUBCUTANEOUS | Status: DC
  Filled 2018-10-22: qty 0.1

## 2018-10-22 MED ORDER — METOPROLOL TARTRATE 25 MG PO TABS
25.0000 mg | ORAL_TABLET | Freq: Two times a day (BID) | ORAL | Status: DC
  Administered 2018-10-22: 25 mg via ORAL
  Filled 2018-10-22: qty 1

## 2018-10-22 MED ORDER — BISACODYL 10 MG RE SUPP
10.0000 mg | Freq: Every day | RECTAL | Status: DC
  Administered 2018-10-22: 10 mg via RECTAL
  Filled 2018-10-22: qty 1

## 2018-10-23 LAB — PREPARE FRESH FROZEN PLASMA

## 2018-10-23 LAB — BPAM FFP
Blood Product Expiration Date: 202009051426
ISSUE DATE / TIME: 202009041429
Unit Type and Rh: 5100

## 2018-11-01 MED FILL — Medication: Qty: 1 | Status: AC

## 2018-11-17 NOTE — Progress Notes (Signed)
Patient's daughter face timed him this evening and he was also able to talk to his son as well. Both were also updated by the RN.

## 2018-11-17 NOTE — Progress Notes (Signed)
Son called and spoke with patient, updated given at this time. Requested face time call later tonight.

## 2018-11-17 NOTE — Progress Notes (Signed)
Patient continues to have runs of Vtach, MD Elgergawy at bedside to assess, new orders received. Vitals stable at this time. Patient continues to require 15L HF [place]. Will continue to monitor closely.

## 2018-11-17 NOTE — Plan of Care (Signed)
  Problem: Clinical Measurements: Goal: Will remain free from infection Outcome: Progressing   Problem: Activity: Goal: Risk for activity intolerance will decrease Outcome: Progressing   Problem: Nutrition: Goal: Adequate nutrition will be maintained Outcome: Progressing   Problem: Safety: Goal: Ability to remain free from injury will improve Outcome: Progressing   Problem: Skin Integrity: Goal: Risk for impaired skin integrity will decrease Outcome: Progressing   Problem: Clinical Measurements: Goal: Diagnostic test results will improve Outcome: Not Progressing Goal: Respiratory complications will improve Outcome: Not Progressing Patient requiring more oxygen today

## 2018-11-17 NOTE — Death Summary Note (Signed)
DEATH SUMMARY   Patient Details  Name: Tony Vasquez MRN: [id number] DOB: [birth year]  Admission/Discharge Information   Admit Date:  12-Nov-2018  Date of Death: Date of Death: 11/17/2018  Time of Death: Time of Death: June 07, 2131  Length of Stay: 6  Referring Physician: Haywood Pao, MD   Reason(s) for Hospitalization   Acute hypoxic respiratory failure due to COVID-19 pneumonia -Patient presents with fever, generalized weakness, poor oral intake, chest x-ray significant for bilateral opacity, as well significantly elevated  inflammatory markers.  Initial hospitalization with no hypoxia for the first 72 hours, patient became hypoxic 9/3 evening, t rapid increase of oxygen requirement, up to 15 L nasal cannula, with a remained good oxygen saturation in the lower 90s, patient kept using incentive spirometry, flutter valve, he was treated with IV steroids, Remdesivir, plasma and Actemra,. -Patient respiratory status appears to be comfortable, where he maintained good O2 sat on 15 L high flow nasal cannula, had sudden cardiopulmonary arrest, where he had apnea with asystole.  COVID-19 Labs  Recent Labs (last 2 labs)         Recent Labs    10/20/18 0220 10/20/18 2201 10/21/18 0001 Nov 17, 2018 0130  DDIMER 0.89* 1.18* 1.19* 1.16*  FERRITIN 2,476*  --  3,351* 4,563*  CRP 19.5*  --  23.8* 22.6*      Recent Labs       Lab Results  Component Value Date   SARSCOV2NAA POSITIVE (A) 11-12-18   SARSCOV2NAA POSITIVE (A) 10/09/2018     Diabetes mellitus -CBGs were labile during hospital stay given COVID-19 and steroids, he required up titration of his long-acting insulin, and was kept on insulin sliding scale during hospital stay Hypertension  AKI on CKD stage III -Patient with worsening renal function, most recent creatinine was 3.3  UTI -Growing Proteus, treated with IV antibiotics  Elevated Troponin -Patient denies any chest pain, delta< 20, this is in the setting of  demand ischemia from his hypoxia     Diagnoses  Preliminary cause of death:   Acute hypoxic respiratory failure secondary to COVID-19 for pneumonia  Secondary Diagnoses (including complications and co-morbidities):  Active Problems:   Dyslipidemia   Chronic kidney disease, unspecified   Benign essential HTN   Diabetes mellitus type 2, uncontrolled, with complications ([place])   Acute respiratory disease due to COVID-19 virus   Brief Hospital Course (including significant findings, care, treatment, and services provided and events leading to death)  Tony Vasquez is a 85 y.o. year old male with medical history significant ofhypertension, type2diabetes,osteoarthritis,Dementiarecently diagnosed COVID-19 illness discharged home on 10/14/2018 , brought in for generalized weakness and fatigue, poor oral intake, low-grade fevers and difficulty walking.  Patient with elevated inflammatory markers, initially with no hypoxia, kept on IV steroids, 9/3 evening patient started to develop hypoxia, rapidly progressed over last 48 hours, where he went from oxygen requirement to 15 L nasal cannula, he was treated with IV steroids during entire hospital stay, IV Remdesivir was initiated after his hypoxia, patient received convulsant plasma, and received Actemra as well, patient continued to have increased work of breathing, with oxygen requirement, as well patient developed acute on chronic kidney disease, with worsening renal function with creatinine peaked at 3.3, goals of care were discussed with the daughter, patient was made DNR except intubation if indicated, Nov 17, 2018, patient was found to be apneic with asystole.   Pertinent Labs and Studies  Significant Diagnostic Studies Ct Head Wo Contrast  Result Date: 11-12-2018 CLINICAL DATA:  Unexplained  altered level consciousness. EXAM: CT HEAD WITHOUT CONTRAST TECHNIQUE: Contiguous axial images were obtained from the base of the skull through the vertex  without intravenous contrast. COMPARISON:  10/09/2018 FINDINGS: Brain: No evidence of acute infarction, hemorrhage, hydrocephalus, extra-axial collection, or mass lesion/mass effect. Moderate diffuse cerebral atrophy and chronic small vessel disease again demonstrated. Vascular:  No hyperdense vessel or other acute findings. Skull: No evidence of fracture or other significant bone abnormality. Sinuses/Orbits:  No acute findings. Other: None. IMPRESSION: No acute intracranial abnormality. Stable cerebral atrophy and chronic small vessel disease. Electronically Signed   By: Marlaine Hind M.D.   On: 10/14/2018 06:45   Ct Head Wo Contrast  Result Date: 10/09/2018 CLINICAL DATA:  Patient with right-sided weakness. EXAM: CT HEAD WITHOUT CONTRAST TECHNIQUE: Contiguous axial images were obtained from the base of the skull through the vertex without intravenous contrast. COMPARISON:  Brain CT 09/17/2016 FINDINGS: Brain: Ventricles and sulci are prominent compatible with atrophy. Motion artifact limits evaluation. No evidence for acute cortically based infarct, intracranial hemorrhage, mass lesion or mass-effect. Vascular: Unremarkable Skull: Intact Sinuses/Orbits: Paranasal sinuses are well aerated. Mastoid air cells are unremarkable. Other: None. IMPRESSION: No acute intracranial process. Atrophy and chronic microvascular ischemic changes. Electronically Signed   By: Lovey Newcomer M.D.   On: 10/09/2018 05:32   US Renal  Result Date: 2018/11/07 CLINICAL DATA:  85 year old male with acute renal insufficiency. History of diabetes. EXAM: RENAL / URINARY TRACT ULTRASOUND COMPLETE COMPARISON:  Abdominal ultrasound dated 09/07/2013 FINDINGS: Evaluation is limited due to overlying bowel gas. Right Kidney: Renal measurements: 7.3 x 4.7 x 5.2 cm = volume: 94 mL. Mild parenchyma atrophy and cortical thinning. No hydronephrosis or shadowing stone. Left Kidney: Renal measurements: 10.9 x 6.8 x 5.1 cm = volume: 197 mL. There is a 3.2  cm echogenic area with posterior shadowing in the mid to lower pole of the left kidney which is suboptimally evaluated by may represent stone. No hydronephrosis.There is a 5.9 x 5.4 x 6.2 cm lower pole cyst. Bladder: Not visualized. IMPRESSION: Echogenic area in left kidney may represent a stone. No hydronephrosis. Electronically Signed   By: Anner Crete M.D.   On: Nov 07, 2018 18:40   Dg Chest Port 1 View  Result Date: 10/20/2018 CLINICAL DATA:  Dyspnea EXAM: PORTABLE CHEST 1 VIEW COMPARISON:  10/04/2018 FINDINGS: Interval worsening of left greater than right bilateral ground-glass opacity and consolidations consistent with history of COVID-19 pneumonia. No pleural effusion. Stable cardiomediastinal silhouette. No pneumothorax. IMPRESSION: Significant interval worsening of left greater than right pulmonary airspace disease consistent with pneumonia. Electronically Signed   By: Donavan Foil M.D.   On: 10/20/2018 22:38   Dg Chest Port 1 View  Result Date: 09/17/2018 CLINICAL DATA:  COVID-19, fever EXAM: PORTABLE CHEST 1 VIEW COMPARISON:  Radiograph October 09, 2018 FINDINGS: Patchy consolidation silhouettes portion of the right hemidiaphragm with additional opacity in the left lung periphery. No pneumothorax or effusion. Cardiomediastinal contours are unremarkable for the portable technique. No acute osseous or soft tissue abnormality. Degenerative changes are present in the imaged spine and shoulders. IMPRESSION: Findings compatible with multifocal pneumonia and patient's known diagnosis of COVID-19 Electronically Signed   By: Lovena Le M.D.   On: 09/19/2018 05:55   Dg Chest Port 1 View  Result Date: 10/09/2018 CLINICAL DATA:  Generalized weakness, dementia EXAM: PORTABLE CHEST 1 VIEW COMPARISON:  08/23/2017 FINDINGS: Lungs are clear.  No pleural effusion or pneumothorax. The heart is normal in size. IMPRESSION: No evidence of acute cardiopulmonary  disease. Electronically Signed   By: Julian Hy M.D.   On: 10/09/2018 13:40    Microbiology Recent Results (from the past 240 hour(s))  Urine culture     Status: Abnormal   Collection Time: 09/20/2018  4:11 AM   Specimen: In/Out Cath Urine  Result Value Ref Range Status   Specimen Description   Final    IN/OUT CATH URINE Performed at [facility] [address], [place], [place] [address]    Special Requests   Final    NONE Performed at [facility], [place] [address] [place], [place] [address]    Culture 40,000 COLONIES/mL PROTEUS MIRABILIS (A)  Final   Report Status 10/19/2018 FINAL  Final   Organism ID, Bacteria PROTEUS MIRABILIS (A)  Final      Susceptibility   Proteus mirabilis - MIC*    AMPICILLIN <=2 SENSITIVE Sensitive     CEFAZOLIN <=4 SENSITIVE Sensitive     CEFTRIAXONE <=1 SENSITIVE Sensitive     CIPROFLOXACIN <=0.25 SENSITIVE Sensitive     GENTAMICIN <=1 SENSITIVE Sensitive     IMIPENEM 2 SENSITIVE Sensitive     NITROFURANTOIN 256 RESISTANT Resistant     TRIMETH/SULFA <=20 SENSITIVE Sensitive     AMPICILLIN/SULBACTAM <=2 SENSITIVE Sensitive     PIP/TAZO <=4 SENSITIVE Sensitive     * 40,000 COLONIES/mL PROTEUS MIRABILIS  SARS Coronavirus 2 [facility] order, Performed in [facility] hospital lab) Nasopharyngeal Nasopharyngeal Swab     Status: Abnormal   Collection Time: 10/07/2018  4:12 AM   Specimen: Nasopharyngeal Swab  Result Value Ref Range Status   SARS Coronavirus 2 POSITIVE (A) NEGATIVE Final    Comment: RESULT CALLED TO, READ BACK BY AND VERIFIED WITH: C.MOORE,RN [id number] @1048  BY V.WILKINS (NOTE) If result is NEGATIVE SARS-CoV-2 target nucleic acids are NOT DETECTED. The SARS-CoV-2 RNA is generally detectable in upper and lower  respiratory specimens during the acute phase of infection. The lowest  concentration of SARS-CoV-2 viral copies this assay can detect is 250  copies / mL. A negative result does not preclude SARS-CoV-2 infection  and should not be used  as the sole basis for treatment or other  patient management decisions.  A negative result may occur with  improper specimen collection / handling, submission of specimen other  than nasopharyngeal swab, presence of viral mutation(s) within the  areas targeted by this assay, and inadequate number of viral copies  (<250 copies / mL). A negative result must be combined with clinical  observations, patient history, and epidemiological information. If result is POSITIVE SARS-CoV-2 target nucleic acids are DETECTED.  The SARS-CoV-2 RNA is generally detectable in upper and lower  respiratory specimens during the acute phase of infection.  Positive  results are indicative of active infection with SARS-CoV-2.  Clinical  correlation with patient history and other diagnostic information is  necessary to determine patient infection status.  Positive results do  not rule out bacterial infection or co-infection with other viruses. If result is PRESUMPTIVE POSTIVE SARS-CoV-2 nucleic acids MAY BE PRESENT.   A presumptive positive result was obtained on the submitted specimen  and confirmed on repeat testing.  While 2019 novel coronavirus  (SARS-CoV-2) nucleic acids may be present in the submitted sample  additional confirmatory testing may be necessary for epidemiological  and / or clinical management purposes  to differentiate between  SARS-CoV-2 and other Sarbecovirus currently known to infect humans.  If clinically indicated additional testing with an alternate test  methodology (  [id number]) i s advised. The SARS-CoV-2 RNA is generally  detectable in upper and lower respiratory specimens during the acute  phase of infection. The expected result is Negative. Fact Sheet for Patients:  StrictlyIdeas.no Fact Sheet for Healthcare Providers: BankingDealers.co.za This test is not yet approved or cleared by the [place] FDA and has been authorized for  detection and/or diagnosis of SARS-CoV-2 by FDA under an Emergency Use Authorization (EUA).  This EUA will remain in effect (meaning this test can be used) for the duration of the COVID-19 declaration under Section 564(b)(1) of the Act, 21 U.S.C. section 360bbb-3(b)(1), unless the authorization is terminated or revoked sooner. Performed at [facility], [place] [address], [place], [place] [address]   Blood Culture (routine x 2)     Status: None   Collection Time: 09/17/2018  4:16 AM   Specimen: BLOOD  Result Value Ref Range Status   Specimen Description   Final    BLOOD BLOOD LEFT HAND Performed at [facility] [address] [place], [place] [address]    Special Requests   Final    BOTTLES DRAWN AEROBIC ONLY Blood Culture results may not be optimal due to an inadequate volume of blood received in culture bottles Performed at [facility] [address], [place], [place] [address]    Culture   Final    NO GROWTH 5 DAYS Performed at [facility], [place] [address] [place], [place] [address]    Report Status November 03, 2018 FINAL  Final    Lab Basic Metabolic Panel: Recent Labs  Lab 10/19/18 0600 10/20/18 0220 10/20/18 2201 10/21/18 0001 2018/11/03 0130  NA 139 140 141 144 139  K 3.9 4.1 3.4* 3.5 4.1  CL 106 107 112* 110 111  CO2 23 21* 16* 19* 17*  GLUCOSE 219* 241* 262* 183* 324*  BUN 48* 63* 76* 76* 89*  CREATININE 2.05* 2.64* 3.10* 3.04* 3.33*  CALCIUM 8.2* 8.0* 7.7* 7.9* 7.3*  MG  --   --  2.4  --   --   PHOS  --   --  2.1*  --   --    Liver Function Tests: Recent Labs  Lab 10/18/18 0440 10/19/18 0600 10/20/18 0220 10/21/18 0001 Nov 03, 2018 0130  AST 19 21 39 60* 81*  ALT 32 28 34 48* 64*  ALKPHOS 41 40 43 53 70  BILITOT 1.0 0.9 0.7 0.8 1.0  PROT 7.4 6.9 6.6 6.6 6.3*  ALBUMIN 3.4* 2.9* 2.6* 2.4* 2.3*   No results for input(s): LIPASE, AMYLASE in the last 168 hours. No results for input(s): AMMONIA in  the last 168 hours. CBC: Recent Labs  Lab 10/18/18 0440 10/19/18 0600 10/20/18 0220 10/21/18 0001 11-03-2018 0130  WBC 11.1* 13.5* 14.9* 17.7* 13.0*  NEUTROABS 9.9* 12.5* 14.0* 16.6* 12.3*  HGB 15.0 14.4 14.0 14.0 13.3  HCT 44.3 42.6 42.8 41.7 39.6  MCV 91.3 90.4 92.2 89.7 89.2  PLT 166 158 161 180 189   Cardiac Enzymes: No results for input(s): CKTOTAL, CKMB, CKMBINDEX, TROPONINI in the last 168 hours. Sepsis Labs: Recent Labs  Lab 10/05/2018 0608 09/20/2018 1207 10/18/18 0440 10/19/18 0600 10/20/18 0220 10/21/18 0001 10/21/18 1628 10/21/18 1815 11-03-18 0130  PROCALCITON  --   --  0.18  --   --  1.48 1.51  --  1.36  WBC  --   --  11.1* 13.5* 14.9* 17.7*  --   --  13.0*  LATICACIDVEN 3.4* 1.1  --   --   --   --  3.0* 2.8*  --     Procedures/Operations       10/27/2018, 3:08 PM

## 2018-11-17 NOTE — Progress Notes (Addendum)
Writer notice telemetry reading apnea on monitor in hallway. Writer went to room and observed patient lying in bed with eyes closed, no rise & fall of chest noted HFNC lying in bed, oxygen reapplied. Patient not responding to verbal, or pain(sternal rub) prompts, no carotid pulse palpated. Code blue button activated in room.   Patient partial code intubation only. 2122-05-18 pulseless ECG rhythm  May 18, 2123 cardiac arrest.  2124-05-17 air bag respirations    May 18, 2126 intubated.  05/18/2127 Started new 20G PIV to Left arm 05/17/28 femoral doppler for pulse 05/18/2131 time of death called by MD

## 2018-11-17 NOTE — Progress Notes (Signed)
[id number] arrived to CODE. Pt was pulseless, and began to bag pt. Anesthesia arrived and intubated pt, RT continued to bag and pt never got pulses back. MD then called time of death, there was no code sheet on code cart to sign.

## 2018-11-17 NOTE — Progress Notes (Addendum)
Spoke with [name] [phone] she advised family would not need to visit patient. And the funeral home of choice is [facility].  She also requested to speak with MD for additional questions. Writer page to notify  Dr Alcario Drought of same.

## 2018-11-17 NOTE — Progress Notes (Signed)
Pharmacy Antibiotic Note  Tony Vasquez is a 85 y.o. male admitted on 09/24/2018 with pneumonia.  Pharmacy has been consulted for Cefepime and vancomycin dosing. Leukocytosis has worsened with associated hypoxia. SCr remains acutely elevated at 3.33. PCT 1.36. CRP 22.6  Of note, a 24 hr vanc random was 20 mcg/dL this AM.   Plan: -Continue Cefepime 2 gm IV Q 24 hours  -Vancomycin 750 mg IV once this evening at 1800. Continue vancomycin dosing per levels.  -Monitor CBC, renal fx, cultures and clinical progress   Height: 5\' 8"  (172.7 cm) Weight: 159 lb 13.3 oz (72.5 kg) IBW/kg (Calculated) : 68.4  Temp (24hrs), Avg:98 F (36.7 C), Min:97.1 F (36.2 C), Max:98.5 F (36.9 C)  Recent Labs  Lab 09/19/2018 0411 10/04/2018 0608 09/17/2018 1207 10/18/18 0440 10/19/18 0600 10/20/18 0220 10/20/18 2201 10/21/18 0001 10/21/18 1628 10/21/18 1815 2018/11/08 0130 11/08/2018 1010  WBC 8.7  --   --  11.1* 13.5* 14.9*  --  17.7*  --   --  13.0*  --   CREATININE 1.92*  --   --  1.74*  1.74* 2.05* 2.64* 3.10* 3.04*  --   --  3.33*  --   LATICACIDVEN 2.5* 3.4* 1.1  --   --   --   --   --  3.0* 2.8*  --   --   VANCORANDOM  --   --   --   --   --   --   --   --   --   --   --  20    Estimated Creatinine Clearance: 16.3 mL/min (A) (by C-G formula based on SCr of 3.33 mg/dL (H)).    Allergies  Allergen Reactions  . Shrimp [Shellfish Allergy] Anaphylaxis  . 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 Was difficult to determine if it was do to Augmentin or both beta lactam agents.    Antimicrobials this admission: Cefepime 8/31 >>  Vanc 9/4 >>   Dose adjustments this admission: None   Microbiology results: 8/31 BCx: ngtd 8/31 UCx: 40k proteus    Thank you for allowing pharmacy to be a part of this patient's care.  Albertina Parr, PharmD.,  BCPS Clinical Pharmacist Clinical phone for Nov 08, 2018 until 5pm: [phone]

## 2018-11-17 NOTE — Progress Notes (Addendum)
PROGRESS NOTE                                                                                                                                                                                                             Patient Demographics:    Tony Vasquez, is a 85 y.o. male, DOB - 1932/11/11, ZO:[id number]  Admit date - 10/07/2018   Admitting Physician Hosie Poisson, MD  Outpatient Primary MD for the patient is [name], [name], MD  LOS - 5   Chief Complaint  Patient presents with  . Fatigue  . COVID       Brief Narrative    85 y.o. male with medical history significant of hypertension, type2diabetes,osteoarthritis,  Dementia recently diagnosed COVID-19 illness discharged home on 10/14/2018 , brought in for generalized weakness and fatigue, poor oral intake, low-grade fevers and difficulty walking.  Patient with elevated inflammatory markers, initially with no hypoxia, kept on IV steroids, 9/3 evening patient started to develop hypoxia, rapidly progressed overnight, patient received plasma, Actemra on 9/4 .   Subjective:    Tony Vasquez was on 12 L nasal cannula this morning, but he appeared comfortable, he himself denied any complaints .   Assessment  & Plan :    Active Problems:   Dyslipidemia   Chronic kidney disease, unspecified   Benign essential HTN   Diabetes mellitus type 2, uncontrolled, with complications ([place])   Acute respiratory disease due to COVID-19 virus  Hypoxic respiratory failure due to COVID-19 pneumonia -Patient presents with fever, generalized weakness, poor oral intake, chest x-ray significant for bilateral opacity, as well significantly elevated  inflammatory markers.  Initial hospitalization with no hypoxia for the first 72 hours, patient became hypoxic 9/3 evening, this morning he is on 12 L nasal cannula, appears comfortable . -Discussed with staff will try to prone today, get out of bed to chair, and courage to  use incentive spirometry and flutter valve . -Started on IV Remdesivir 10/20/2018  -Continue with IV Solu-Medrol 40 mg every 8 hours  -I will continue with broad-spectrum antibiotic coverage given procalcitonin of 1.5, but it is trending to 1.3 today which is reassuring. -Patient received plasma 9/4 ,Discussed with daughter,  I have obtained IRB consent, she reviewed EUA fact sheet as well. -Continue to monitor inflammatory markers closely, CRP significantly better 22.6, and ferritin elevated  at 4.563 as well. -Received Actemra 10/21/2018, have discussed at length with daughter on 9/4, explained risks and benefit, she understands risks and possible side effects of Actemra, but given patient rapid clinical deterioration, significantly inflammatory inflammatory markers, and high likelihood of progressive hypoxia which will require ventilation at one point, which carries very poor outcome, decision has been made to proceed with Actemra. The treatment plan and use of medications and known side effects were discussed with patient/family, they were clearly explained that there is no proven definitive treatment for COVID-19 infection, any medications used here are based on published clinical articles/anecdotal data which are not peer-reviewed or randomized control trials.  Complete risks and long-term side effects are unknown, however in the best clinical judgment they seem to be of some clinical benefit rather than medical risks.  Patient/family agree with the treatment plan and want to receive the given medications.    COVID-19 Labs  Recent Labs    10/20/18 0220 10/20/18 2201 10/21/18 0001 Nov 15, 2018 0130  DDIMER 0.89* 1.18* 1.19* 1.16*  FERRITIN 2,476*  --  3,351* 4,563*  CRP 19.5*  --  23.8* 22.6*    Lab Results  Component Value Date   SARSCOV2NAA POSITIVE (A) 10/16/2018   SARSCOV2NAA POSITIVE (A) 10/09/2018   Diabetes mellitus -CBGs remained poorly controlled, likely in the setting of acute  COVID-19 infection and IV steroids, remains significantly elevated on Lantus 35 units subcu daily, will add 15 units subcu nightly, and will add NovoLog 6 units before meals, and continue with resistant sliding scale as well .  Hypertension -Blood pressure acceptable, no further soft blood pressure readings, continue to hold amlodipine . - (his beta-blockers were stopped during previous admission secondary to bradycardia)  AKI on CKD stage III -Creatinine continues to increase, it is 3.3 today, he has increase in his BUN, his Roney Mans is 6.4%, which is suspicious of postrenal AKI, I will insert Foley to monitor ins and outs closely, and will check renal ultrasound as well. -Continue with IV fluids, monitor closely to avoid volume overload.  UTI -Growing Proteus, treated with IV antibiotics  Elevated Troponin -Patient denies any chest pain, delta< 20, this is in the setting of demand ischemia from his hypoxia  Addendum:( 6:04 pm) -Had few rounds of nonsustained V. tach's, recent echo last week with a preserved EF, I will start him on low-dose beta-blockers, his potassium within normal limit./ -Patient continues to rapidly deteriorate, with increased oxygen requirement, he is currently on 15 L high flow nasal cannula, discussed goals of care with the daughter [name] , patient has been made no code, no CPR, but if respiratory status worsened and required ventilatory support, it is okay to intubate.  Code Status : DNR, no CPR, but intubate if indicated  Family Communication  : Discussed with daughter via phone 9/2  Disposition Plan  : Will need SNF placement  Barriers For Discharge : Remains on IV antibiotics, IV steroids  Consults  :  None  Procedures  : None  DVT Prophylaxis  :  [place] heparin  Lab Results  Component Value Date   PLT 189 Nov 15, 2018    Antibiotics  :    Anti-infectives (From admission, onward)   Start     Dose/Rate Route Frequency Ordered Stop   10/21/18  2200  remdesivir 100 mg in sodium chloride 0.9 % 250 mL IVPB     100 mg 500 mL/hr over 30 Minutes Intravenous Every 24 hours 10/20/18 2230 10/25/18 2159   10/21/18 1400  cefTRIAXone (ROCEPHIN) 2 g in sodium chloride 0.9 % 100 mL IVPB  Status:  Discontinued     2 g 200 mL/hr over 30 Minutes Intravenous  Once 10/20/18 1646 10/21/18 0757   10/21/18 1328  vancomycin variable dose per unstable renal function (pharmacist dosing)      Does not apply See admin instructions 10/21/18 1328     10/21/18 0830  ceFEPIme (MAXIPIME) 2 g in sodium chloride 0.9 % 100 mL IVPB     2 g 200 mL/hr over 30 Minutes Intravenous Every 24 hours 10/21/18 0757     10/21/18 0830  vancomycin (VANCOCIN) 1,500 mg in sodium chloride 0.9 % 500 mL IVPB     1,500 mg 250 mL/hr over 120 Minutes Intravenous  Once 10/21/18 0757 10/21/18 1129   10/20/18 2315  remdesivir 200 mg in sodium chloride 0.9 % 250 mL IVPB     200 mg 500 mL/hr over 30 Minutes Intravenous Once 10/20/18 2230 10/21/18 0025   10/18/18 0800  vancomycin (VANCOCIN) IVPB 750 mg/150 ml premix  Status:  Discontinued     750 mg 150 mL/hr over 60 Minutes Intravenous Every 24 hours 09/25/2018 1152 10/19/18 1131   09/26/2018 1400  ceFEPIme (MAXIPIME) 2 g in sodium chloride 0.9 % 100 mL IVPB  Status:  Discontinued     2 g 200 mL/hr over 30 Minutes Intravenous Every 24 hours 10/13/2018 1152 10/20/18 1646   09/18/2018 0630  vancomycin (VANCOCIN) 1,500 mg in sodium chloride 0.9 % 500 mL IVPB     1,500 mg 250 mL/hr over 120 Minutes Intravenous  Once 10/09/2018 0628 10/04/2018 0951   10/14/2018 0615  vancomycin (VANCOCIN) IVPB 1000 mg/200 mL premix  Status:  Discontinued     1,000 mg 200 mL/hr over 60 Minutes Intravenous  Once 10/09/2018 0606 10/07/2018 0627   10/07/2018 0615  aztreonam (AZACTAM) 2 g in sodium chloride 0.9 % 100 mL IVPB     2 g 200 mL/hr over 30 Minutes Intravenous  Once 10/16/2018 0606 09/17/2018 0713        Objective:   Vitals:   10/21/18 2155 10-24-18 0515 2018/10/24 0719  10-24-2018 0800  BP:  130/65 133/68 139/82  Pulse:  86 87 88  Resp:      Temp:  (!) 97.1 F (36.2 C) 98.4 F (36.9 C)   TempSrc:  Oral Axillary   SpO2: (!) 87% (!) 89% 93% (!) 89%  Weight:      Height:        Wt Readings from Last 3 Encounters:  10/06/2018 72.5 kg  10/13/18 76.1 kg  08/24/17 74 kg     Intake/Output Summary (Last 24 hours) at 10/24/18 0937 Last data filed at 10/24/18 0929 Gross per 24 hour  Intake 2267.17 ml  Output 1350 ml  Net 917.17 ml     Physical Exam  Awake Alert, pleasant, appropriate but mildly demented, laying in bed, appears comfortable in no apparent distress . Symmetrical Chest wall movement, Good air movement bilaterally, CTAB RRR,No Gallops,Rubs or new Murmurs, No Parasternal Heave +ve B.Sounds, Abd Soft, No tenderness, No rebound - guarding or rigidity. No Cyanosis, Clubbing or edema, No new Rash or bruise     Data Review:    CBC Recent Labs  Lab 10/18/18 0440 10/19/18 0600 10/20/18 0220 10/21/18 0001 October 24, 2018 0130  WBC 11.1* 13.5* 14.9* 17.7* 13.0*  HGB 15.0 14.4 14.0 14.0 13.3  HCT 44.3 42.6 42.8 41.7 39.6  PLT 166 158 161 180 189  MCV 91.3 90.4  92.2 89.7 89.2  MCH 30.9 30.6 30.2 30.1 30.0  MCHC 33.9 33.8 32.7 33.6 33.6  RDW 13.2 13.0 13.5 13.6 13.9  LYMPHSABS 0.7 0.6* 0.5* 0.5* 0.4*  MONOABS 0.4 0.3 0.2 0.3 0.2  EOSABS 0.0 0.0 0.0 0.0 0.0  BASOSABS 0.0 0.0 0.0 0.0 0.0    Chemistries  Recent Labs  Lab 10/18/18 0440 10/19/18 0600 10/20/18 0220 10/20/18 2201 10/21/18 0001 November 08, 2018 0130  NA 133* 139 140 141 144 139  K 4.1 3.9 4.1 3.4* 3.5 4.1  CL 97* 106 107 112* 110 111  CO2 23 23 21* 16* 19* 17*  GLUCOSE 153* 219* 241* 262* 183* 324*  BUN 40* 48* 63* 76* 76* 89*  CREATININE 1.74*  1.74* 2.05* 2.64* 3.10* 3.04* 3.33*  CALCIUM 8.7* 8.2* 8.0* 7.7* 7.9* 7.3*  MG  --   --   --  2.4  --   --   AST 19 21 39  --  60* 81*  ALT 32 28 34  --  48* 64*  ALKPHOS 41 40 43  --  53 70  BILITOT 1.0 0.9 0.7  --  0.8 1.0    ------------------------------------------------------------------------------------------------------------------ No results for input(s): CHOL, HDL, LDLCALC, TRIG, CHOLHDL, LDLDIRECT in the last 72 hours.  Lab Results  Component Value Date   HGBA1C 8.7 (H) 10/09/2018   ------------------------------------------------------------------------------------------------------------------ No results for input(s): TSH, T4TOTAL, T3FREE, THYROIDAB in the last 72 hours.  Invalid input(s): FREET3 ------------------------------------------------------------------------------------------------------------------ Recent Labs    10/21/18 0001 11-08-18 0130  FERRITIN 3,351* 4,563*    Coagulation profile No results for input(s): INR, PROTIME in the last 168 hours.  Recent Labs    10/21/18 0001 11-08-2018 0130  DDIMER 1.19* 1.16*    Cardiac Enzymes No results for input(s): CKMB, TROPONINI, MYOGLOBIN in the last 168 hours.  Invalid input(s): CK ------------------------------------------------------------------------------------------------------------------    Component Value Date/Time   BNP 106.9 (H) 10/20/2018 2201    Inpatient Medications  Scheduled Meds: . sodium chloride   Intravenous Once  . feeding supplement (ENSURE ENLIVE)  237 mL Oral TID BM  . heparin injection (subcutaneous)  5,000 Units Subcutaneous Q8H  . insulin aspart  0-20 Units Subcutaneous TID WC  . insulin aspart  0-5 Units Subcutaneous QHS  . insulin glargine  35 Units Subcutaneous Daily  . methylPREDNISolone (SOLU-MEDROL) injection  40 mg Intravenous Q8H  . pantoprazole  40 mg Oral Daily  . vancomycin variable dose per unstable renal function (pharmacist dosing)   Does not apply See admin instructions  . vitamin C  500 mg Oral Daily  . zinc sulfate  220 mg Oral Daily   Continuous Infusions: . sodium chloride 75 mL/hr at 10/21/18 1700  . ceFEPime (MAXIPIME) IV 2 g (November 08, 2018 0810)  . remdesivir 100 mg in NS  250 mL 100 mg (10/21/18 2159)   PRN Meds:.acetaminophen  Micro Results Recent Results (from the past 240 hour(s))  Urine culture     Status: Abnormal   Collection Time: 10/08/2018  4:11 AM   Specimen: In/Out Cath Urine  Result Value Ref Range Status   Specimen Description   Final    IN/OUT CATH URINE Performed at [facility] [address] [place], [place] [address]    Special Requests   Final    NONE Performed at [facility], [place] [address], [place],  [address]    Culture 40,000 COLONIES/mL PROTEUS MIRABILIS (A)  Final   Report Status 10/19/2018 FINAL  Final   Organism ID,  Bacteria PROTEUS MIRABILIS (A)  Final      Susceptibility   Proteus mirabilis - MIC*    AMPICILLIN <=2 SENSITIVE Sensitive     CEFAZOLIN <=4 SENSITIVE Sensitive     CEFTRIAXONE <=1 SENSITIVE Sensitive     CIPROFLOXACIN <=0.25 SENSITIVE Sensitive     GENTAMICIN <=1 SENSITIVE Sensitive     IMIPENEM 2 SENSITIVE Sensitive     NITROFURANTOIN 256 RESISTANT Resistant     TRIMETH/SULFA <=20 SENSITIVE Sensitive     AMPICILLIN/SULBACTAM <=2 SENSITIVE Sensitive     PIP/TAZO <=4 SENSITIVE Sensitive     * 40,000 COLONIES/mL PROTEUS MIRABILIS  SARS Coronavirus 2 [facility] order, Performed in [facility] hospital lab) Nasopharyngeal Nasopharyngeal Swab     Status: Abnormal   Collection Time: 10/09/2018  4:12 AM   Specimen: Nasopharyngeal Swab  Result Value Ref Range Status   SARS Coronavirus 2 POSITIVE (A) NEGATIVE Final    Comment: RESULT CALLED TO, READ BACK BY AND VERIFIED WITH: C.MOORE,RN [id number] @1048  BY V.WILKINS (NOTE) If result is NEGATIVE SARS-CoV-2 target nucleic acids are NOT DETECTED. The SARS-CoV-2 RNA is generally detectable in upper and lower  respiratory specimens during the acute phase of infection. The lowest  concentration of SARS-CoV-2 viral copies this assay can detect is 250  copies / mL. A negative result does not preclude SARS-CoV-2 infection  and  should not be used as the sole basis for treatment or other  patient management decisions.  A negative result may occur with  improper specimen collection / handling, submission of specimen other  than nasopharyngeal swab, presence of viral mutation(s) within the  areas targeted by this assay, and inadequate number of viral copies  (<250 copies / mL). A negative result must be combined with clinical  observations, patient history, and epidemiological information. If result is POSITIVE SARS-CoV-2 target nucleic acids are DETECTED.  The SARS-CoV-2 RNA is generally detectable in upper and lower  respiratory specimens during the acute phase of infection.  Positive  results are indicative of active infection with SARS-CoV-2.  Clinical  correlation with patient history and other diagnostic information is  necessary to determine patient infection status.  Positive results do  not rule out bacterial infection or co-infection with other viruses. If result is PRESUMPTIVE POSTIVE SARS-CoV-2 nucleic acids MAY BE PRESENT.   A presumptive positive result was obtained on the submitted specimen  and confirmed on repeat testing.  While 2019 novel coronavirus  (SARS-CoV-2) nucleic acids may be present in the submitted sample  additional confirmatory testing may be necessary for epidemiological  and / or clinical management purposes  to differentiate between  SARS-CoV-2 and other Sarbecovirus currently known to infect humans.  If clinically indicated additional testing with an alternate test  methodology [phone]) i s advised. The SARS-CoV-2 RNA is generally  detectable in upper and lower respiratory specimens during the acute  phase of infection. The expected result is Negative. Fact Sheet for Patients:  StrictlyIdeas.no Fact Sheet for Healthcare Providers: BankingDealers.co.za This test is not yet approved or cleared by the [place] FDA and has been  authorized for detection and/or diagnosis of SARS-CoV-2 by FDA under an Emergency Use Authorization (EUA).  This EUA will remain in effect (meaning this test can be used) for the duration of the COVID-19 declaration under Section 564(b)(1) of the Act, 21 U.S.C. section 360bbb-3(b)(1), unless the authorization is terminated or revoked sooner. Performed at [facility], [place] [address] [place], [place] [address]   Blood Culture (routine x  2)     Status: None (Preliminary result)   Collection Time: 09/26/2018  4:16 AM   Specimen: BLOOD  Result Value Ref Range Status   Specimen Description   Final    BLOOD BLOOD LEFT HAND Performed at [facility] [address] [place], [place] [address]    Special Requests   Final    BOTTLES DRAWN AEROBIC ONLY Blood Culture results may not be optimal due to an inadequate volume of blood received in culture bottles Performed at [facility] [address], [place], [place] [address]    Culture   Final    NO GROWTH 4 DAYS Performed at [facility], [place] [address] [place], [place] [address]    Report Status PENDING  Incomplete    Radiology Reports Ct Head Wo Contrast  Result Date: 09/30/2018 CLINICAL DATA:  Unexplained altered level consciousness. EXAM: CT HEAD WITHOUT CONTRAST TECHNIQUE: Contiguous axial images were obtained from the base of the skull through the vertex without intravenous contrast. COMPARISON:  10/09/2018 FINDINGS: Brain: No evidence of acute infarction, hemorrhage, hydrocephalus, extra-axial collection, or mass lesion/mass effect. Moderate diffuse cerebral atrophy and chronic small vessel disease again demonstrated. Vascular:  No hyperdense vessel or other acute findings. Skull: No evidence of fracture or other significant bone abnormality. Sinuses/Orbits:  No acute findings. Other: None. IMPRESSION: No acute intracranial abnormality. Stable cerebral atrophy and chronic  small vessel disease. Electronically Signed   By: Marlaine Hind M.D.   On: 10/14/2018 06:45   Ct Head Wo Contrast  Result Date: 10/09/2018 CLINICAL DATA:  Patient with right-sided weakness. EXAM: CT HEAD WITHOUT CONTRAST TECHNIQUE: Contiguous axial images were obtained from the base of the skull through the vertex without intravenous contrast. COMPARISON:  Brain CT 09/17/2016 FINDINGS: Brain: Ventricles and sulci are prominent compatible with atrophy. Motion artifact limits evaluation. No evidence for acute cortically based infarct, intracranial hemorrhage, mass lesion or mass-effect. Vascular: Unremarkable Skull: Intact Sinuses/Orbits: Paranasal sinuses are well aerated. Mastoid air cells are unremarkable. Other: None. IMPRESSION: No acute intracranial process. Atrophy and chronic microvascular ischemic changes. Electronically Signed   By: Lovey Newcomer M.D.   On: 10/09/2018 05:32   Dg Chest Port 1 View  Result Date: 10/20/2018 CLINICAL DATA:  Dyspnea EXAM: PORTABLE CHEST 1 VIEW COMPARISON:  10/07/2018 FINDINGS: Interval worsening of left greater than right bilateral ground-glass opacity and consolidations consistent with history of COVID-19 pneumonia. No pleural effusion. Stable cardiomediastinal silhouette. No pneumothorax. IMPRESSION: Significant interval worsening of left greater than right pulmonary airspace disease consistent with pneumonia. Electronically Signed   By: Donavan Foil M.D.   On: 10/20/2018 22:38   Dg Chest Port 1 View  Result Date: 10/12/2018 CLINICAL DATA:  COVID-19, fever EXAM: PORTABLE CHEST 1 VIEW COMPARISON:  Radiograph October 09, 2018 FINDINGS: Patchy consolidation silhouettes portion of the right hemidiaphragm with additional opacity in the left lung periphery. No pneumothorax or effusion. Cardiomediastinal contours are unremarkable for the portable technique. No acute osseous or soft tissue abnormality. Degenerative changes are present in the imaged spine and shoulders.  IMPRESSION: Findings compatible with multifocal pneumonia and patient's known diagnosis of COVID-19 Electronically Signed   By: Lovena Le M.D.   On: 10/16/2018 05:55   Dg Chest Port 1 View  Result Date: 10/09/2018 CLINICAL DATA:  Generalized weakness, dementia EXAM: PORTABLE CHEST 1 VIEW COMPARISON:  08/23/2017 FINDINGS: Lungs are clear.  No pleural effusion or pneumothorax. The heart is normal in size. IMPRESSION: No evidence of acute  cardiopulmonary disease. Electronically Signed   By: Julian Hy M.D.   On: 10/09/2018 13:40      Phillips Climes M.D on 11-01-18 at 9:37 AM  Between 7am to 7pm - Pager - [phone]  After 7pm go to [email or web address] - password [facility]  [facility] -  Office  [phone]

## 2018-11-17 NOTE — Progress Notes (Signed)
Patient was very restless in bed on rounds, stated he needed to have a bowel movement and he was assisted onto the bed pan.  He was straining to have a BM and began to have several runs of V-tach on the monitor. His sats also dropped into the low 80s at this time. His O2 was increased to 15L HFNC, and he was repositioned. SBP 110. He remained alert to self and place the entire time.  MD Elgergawy made aware, and new orders received. EKG completed at this time. ICU charge RN also came to round on patient, he is resting at this time and back on 12L HFNC. Will continue to monitor him closely.

## 2018-11-17 NOTE — Anesthesia Procedure Notes (Signed)
Procedure Name: Intubation Date/Time: October 25, 2018 9:22 PM Performed by: Oletta Lamas, CRNA Pre-anesthesia Checklist: Patient identified, Suction available and Patient being monitored Oxygen Delivery Method: Ambu bag Preoxygenation: Pre-oxygenation with 100% oxygen Ventilation: Mask ventilation without difficulty Laryngoscope Size: Mac and 4 Grade View: Grade I Tube type: Oral Tube size: 7.5 mm Number of attempts: 1 Airway Equipment and Method: Stylet Placement Confirmation: ETT inserted through vocal cords under direct vision,  CO2 detector and breath sounds checked- equal and bilateral Secured at: 23 cm Tube secured with: Tape

## 2018-11-17 NOTE — Progress Notes (Signed)
[place] alert yellow for increased respirations, patient talking on phone with family members, respirations 25 at this time, with oxygen sats at 91% on 15L HFNC.

## 2018-11-17 NOTE — Progress Notes (Signed)
RT came to check on patient. Patient currently on 15L HFNC sat 96%. Patient on the phone at this time. Patient in no distress.

## 2018-11-17 NOTE — Progress Notes (Signed)
Responded to Code Blue being called on patient.  Patient with no pulse and in PEA on arrival to room.  Code status is listed as "Partial" with Intubation only, no CPR.  Patient Intubated, but remained in cardiac arrest and ultimately A.systole.  No femoral, carotid, or radial pulse was palpable nor audible on doppler.  Patient pronounced at 933pm.  Family called and notified.

## 2018-11-17 DEATH — deceased

## 2018-12-18 NOTE — Progress Notes (Signed)
Entered chart for [employer]

## 2019-12-29 IMAGING — CT CT HEAD WITHOUT CONTRAST
3 of 6 series · 12 of 47 positions shown, 14 images · non-contrast
Comparison: Brain CT 09/17/2016

CLINICAL DATA: Patient with right-sided weakness.

EXAM:
CT HEAD WITHOUT CONTRAST
TECHNIQUE: Contiguous axial images were obtained from the base of the skull
through the vertex without intravenous contrast.

[Series 2: head wo · axial · 0.43mm/px · z∈[+859,+969]mm · 7 of 28 slices shown, 9 images]
[im 3/28  brain]
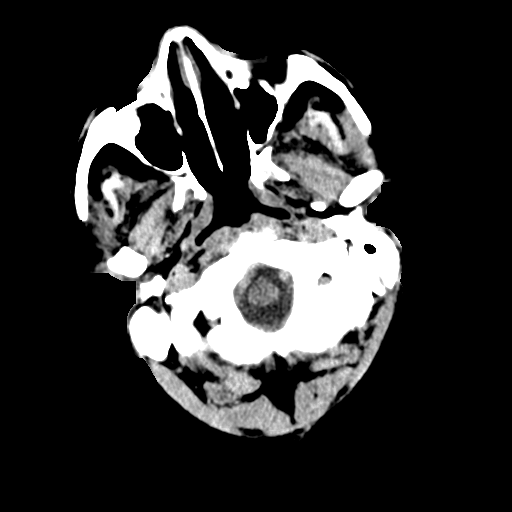
[im 3/28  bone]
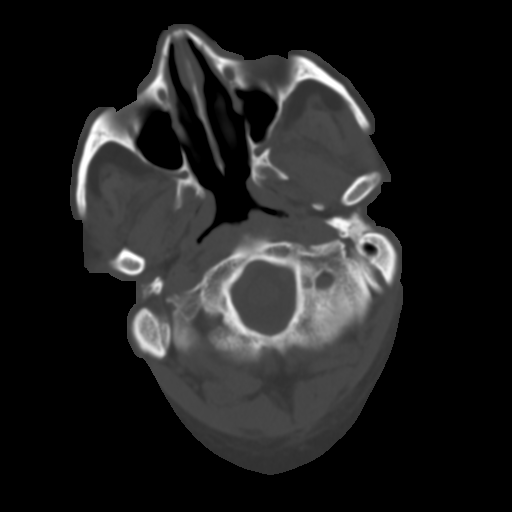
[im 7/28  brain]
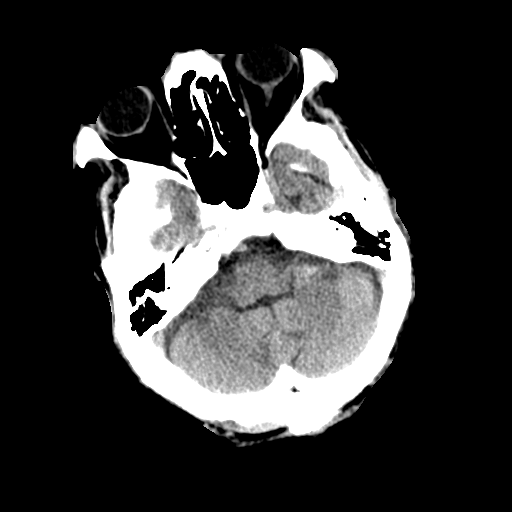
[im 11/28  brain]
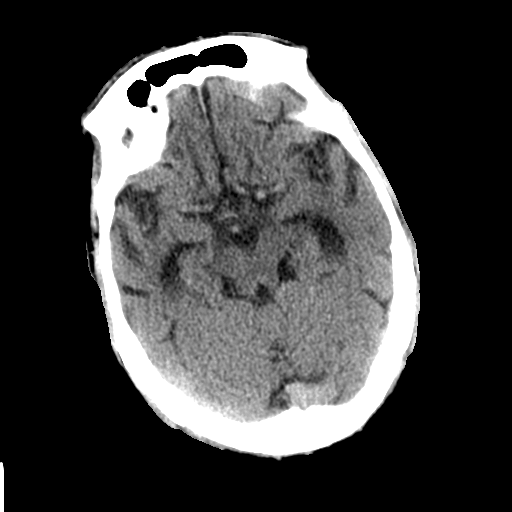
[im 15/28  brain]
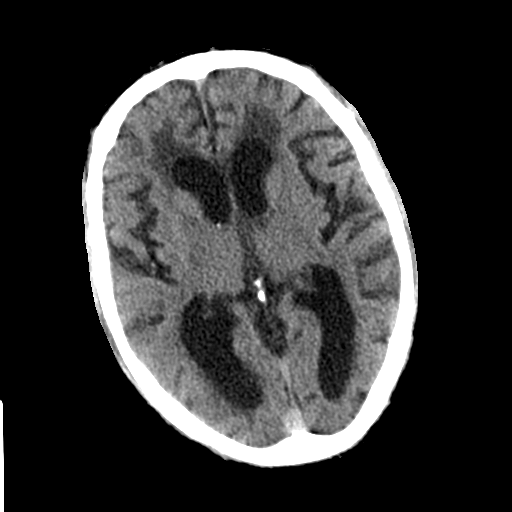
[im 17/28  brain]
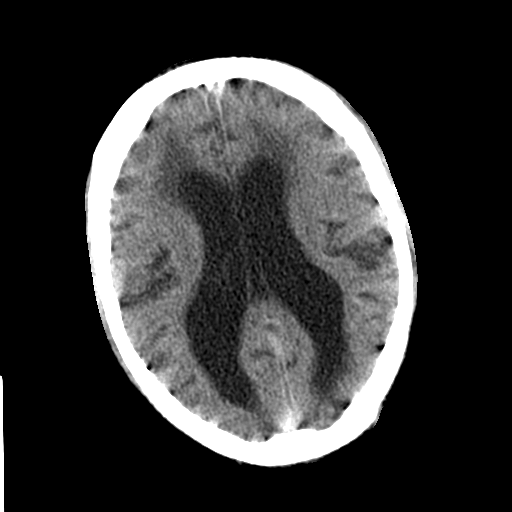
[im 17/28  bone]
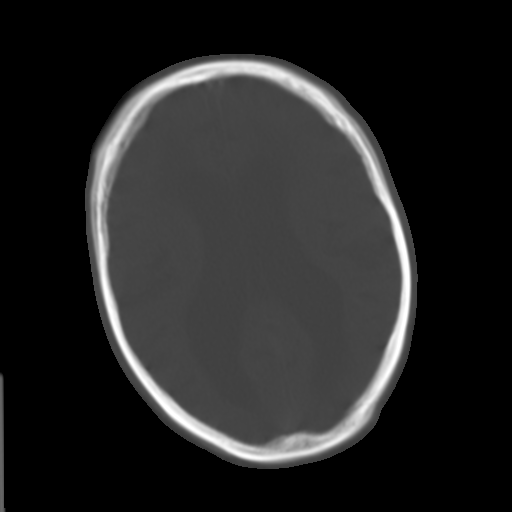
[im 21/28  brain]
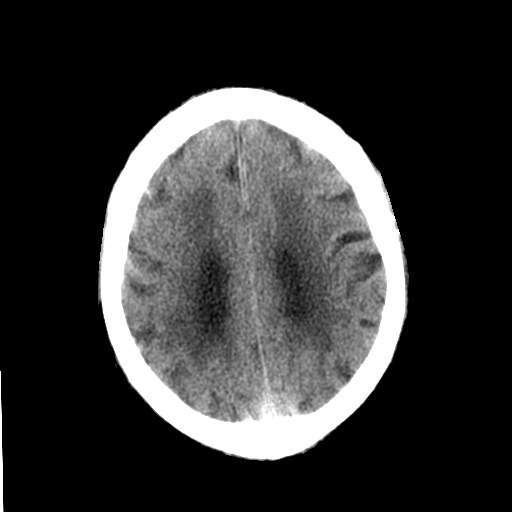
[im 25/28  brain]
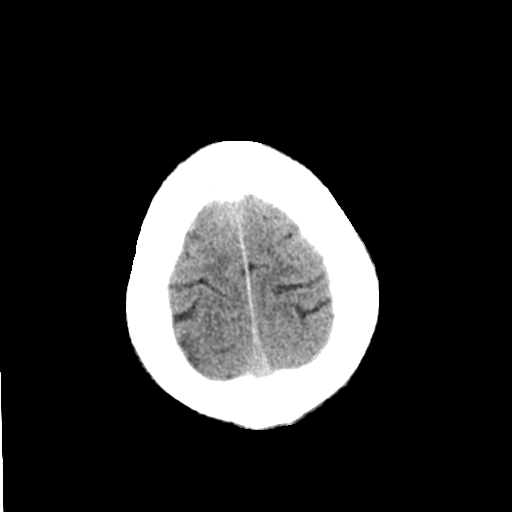

[Series 4: coronal soft tissue · coronal · 0.27mm/px · 3 of 70 slices shown]
[im 18/70  brain]
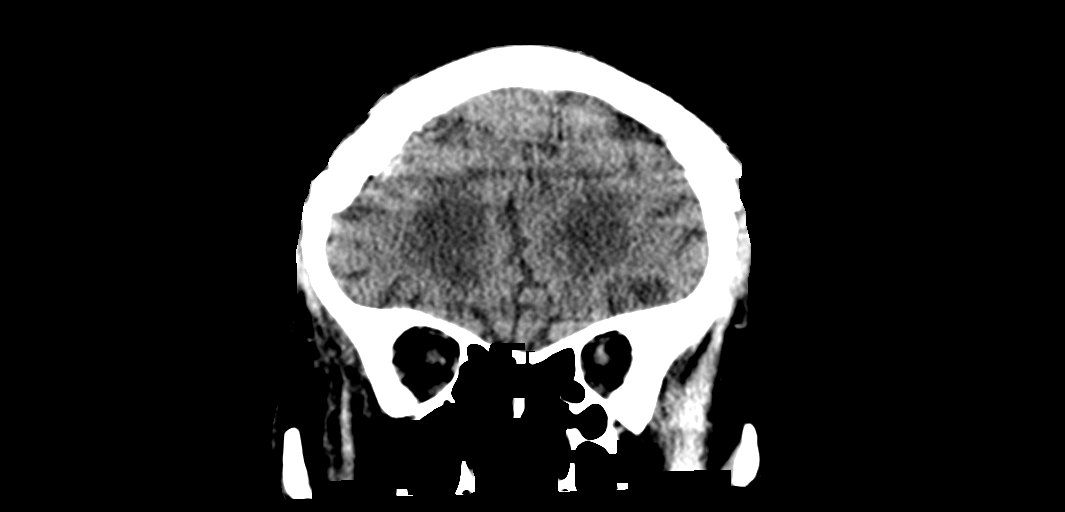
[im 35/70  brain]
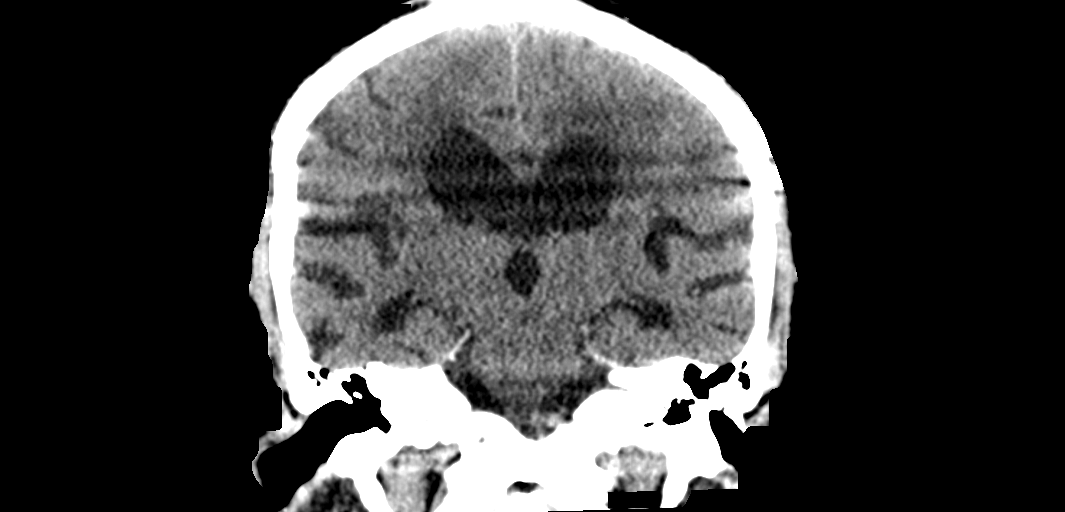
[im 52/70  brain]
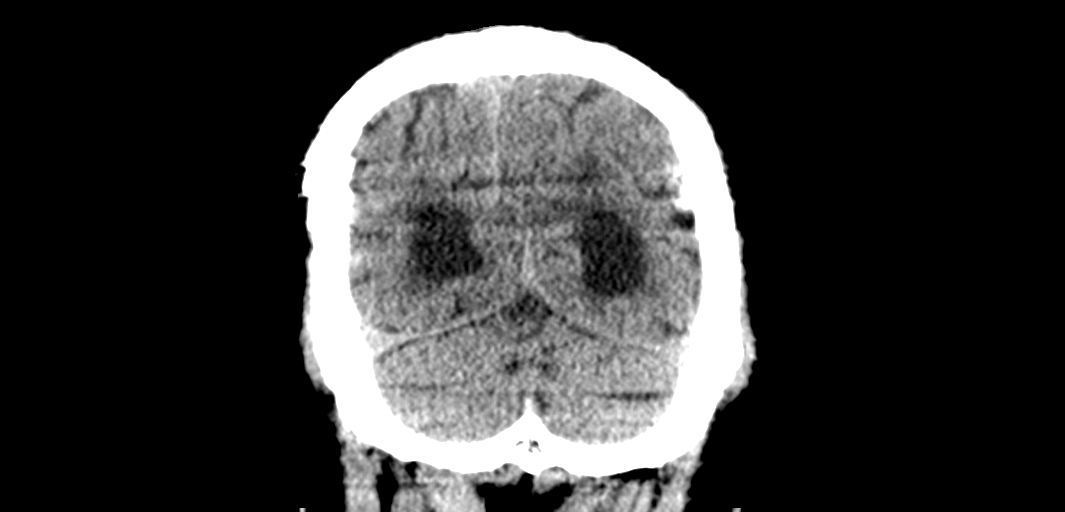

[Series 5: sagittal soft tissue · sagittal · 0.27mm/px · 2 of 57 slices shown]
[im 19/57  brain]
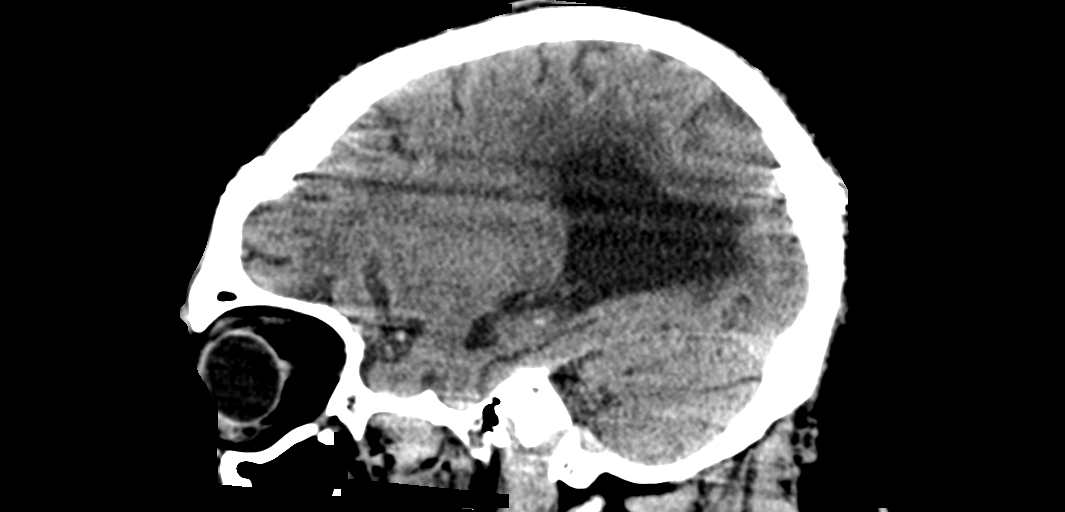
[im 38/57  brain]
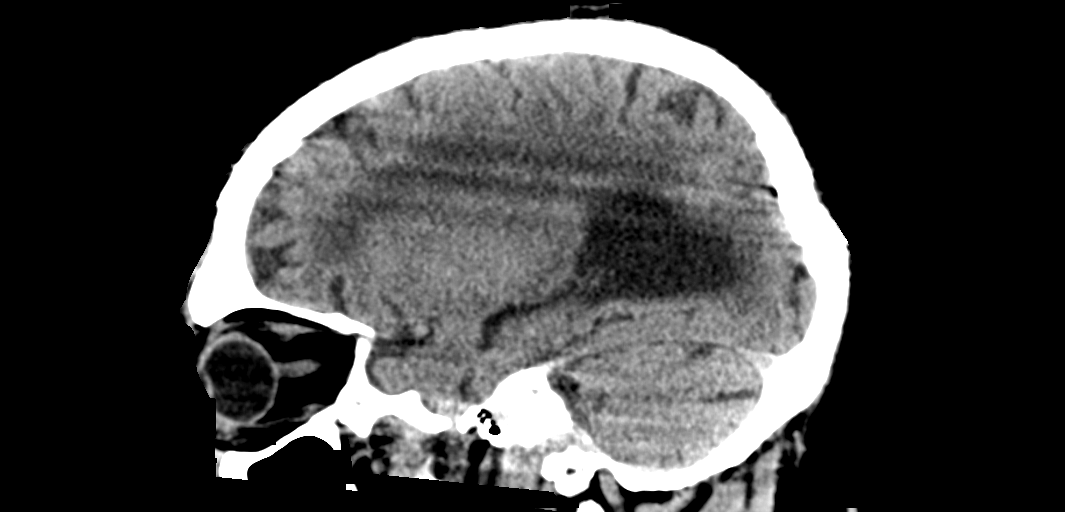

[12 of 47 positions shown; findings below may reference images not displayed]

FINDINGS: Brain: Ventricles and sulci are prominent compatible with atrophy.
Motion artifact limits evaluation. No evidence for acute cortically
based infarct, intracranial hemorrhage, mass lesion or mass-effect.

Vascular: Unremarkable

Skull: Intact

Sinuses/Orbits: Paranasal sinuses are well aerated. Mastoid air
cells are unremarkable.

Other: None.
IMPRESSION: No acute intracranial process.

Atrophy and chronic microvascular ischemic changes.

## 2020-01-09 IMAGING — DX DG CHEST 1V PORT
1 series · 2 of 2 positions shown · non-contrast
Comparison: 10/17/2018

CLINICAL DATA: Dyspnea

EXAM:
PORTABLE CHEST 1 VIEW

[Series 1: chest · 0.14mm/px · 2 of 2 slices shown]
[im 1/2]
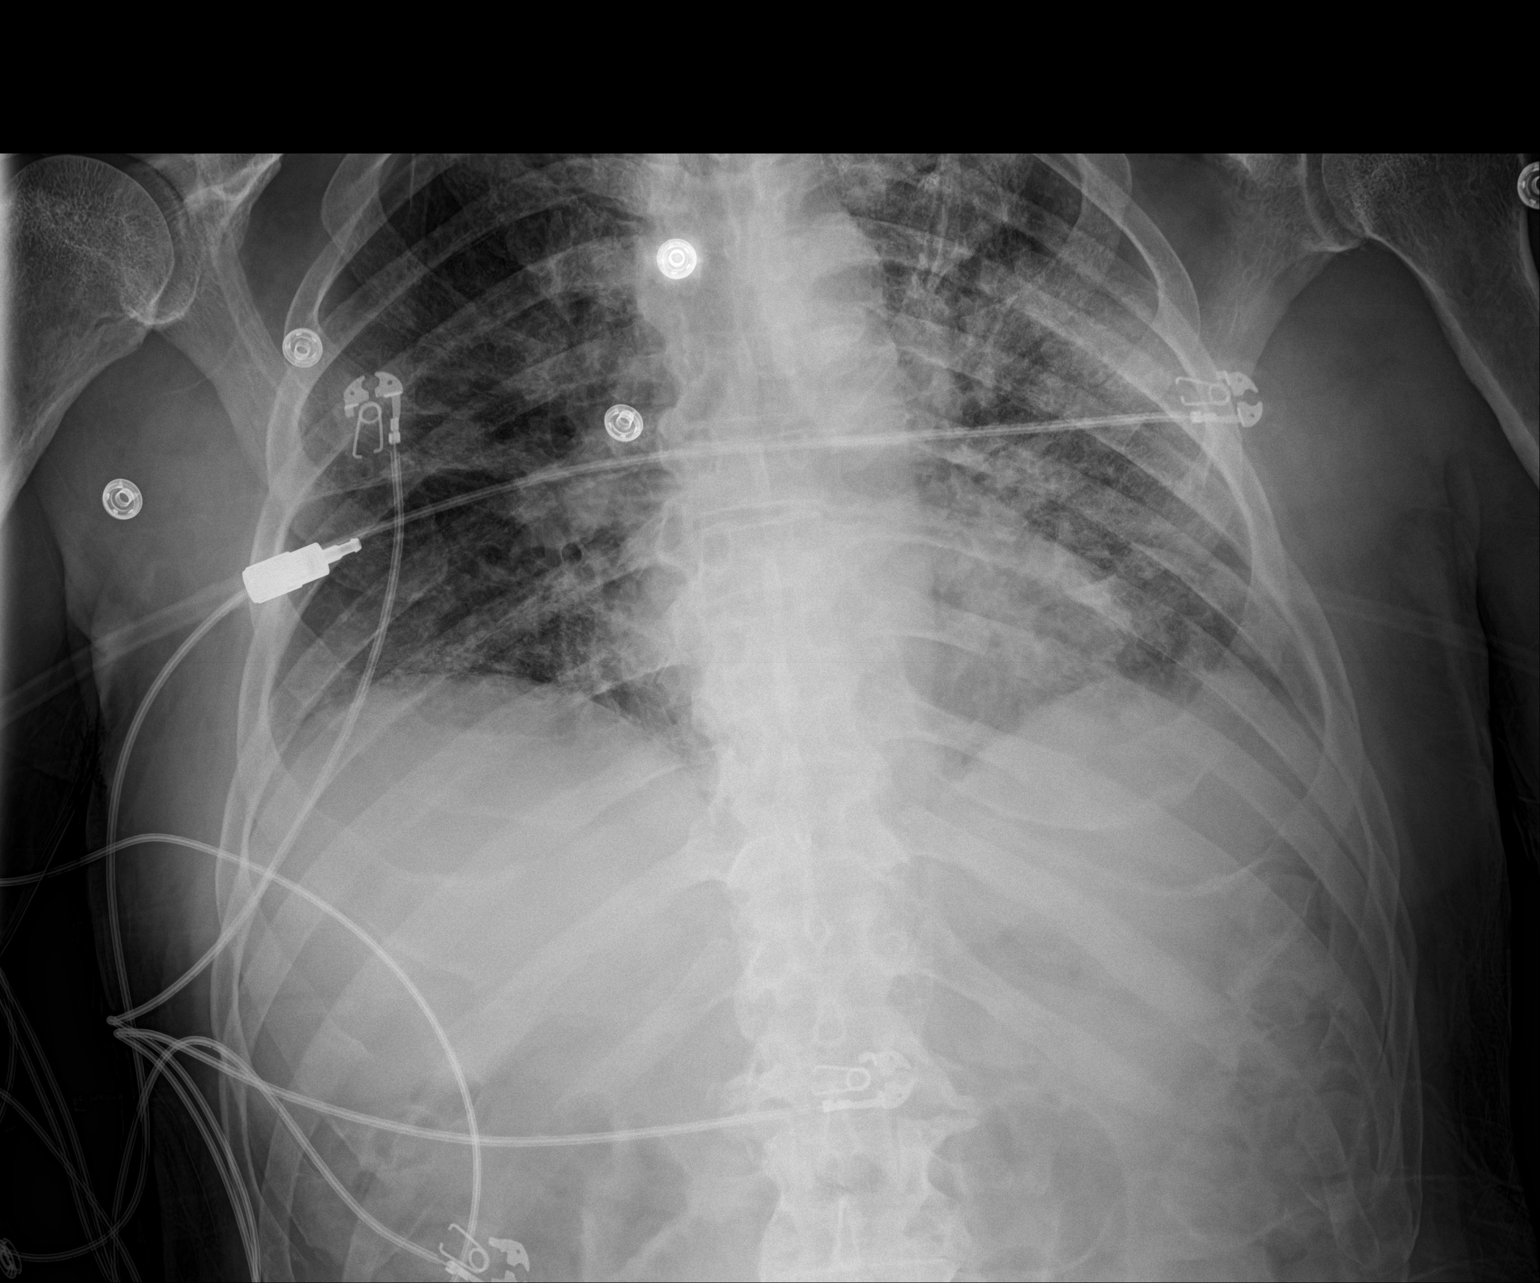
[im 2/2]
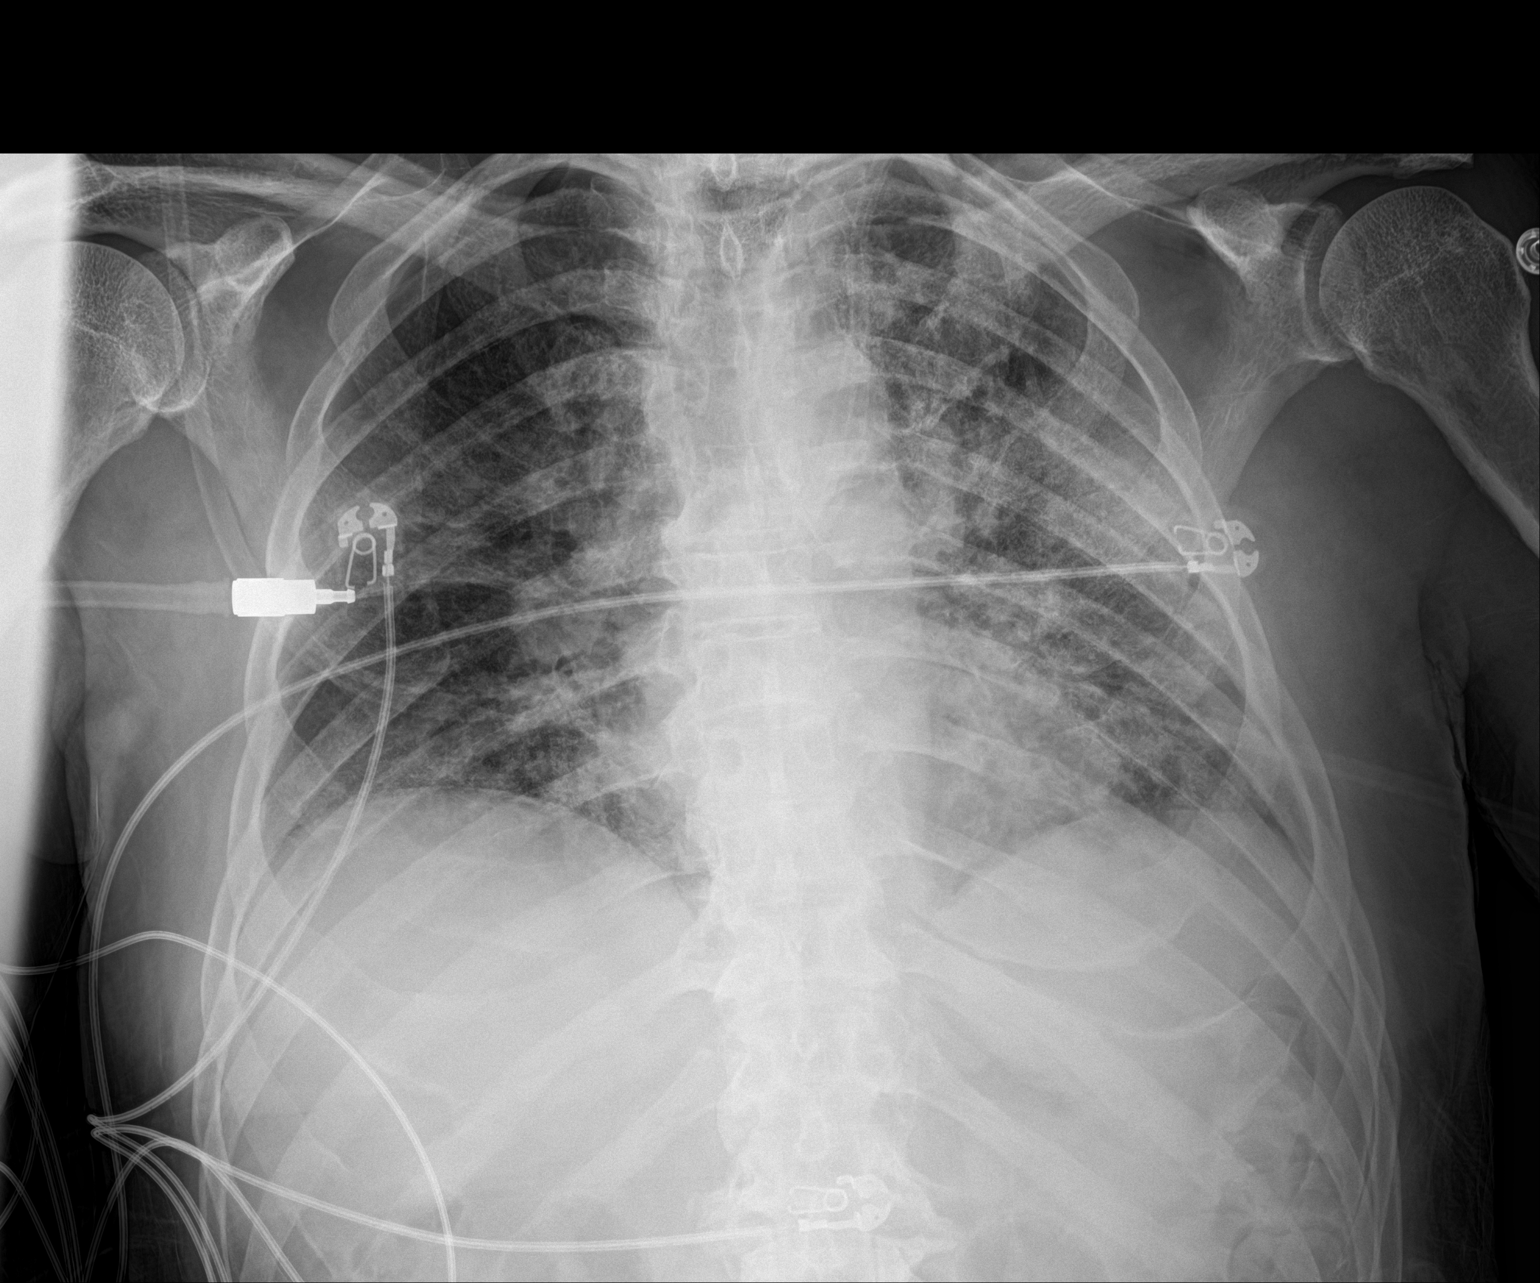

[2 of 2 positions shown; findings below may reference images not displayed]

FINDINGS: Interval worsening of left greater than right bilateral ground-glass
opacity and consolidations consistent with history of [id number]
pneumonia. No pleural effusion. Stable cardiomediastinal silhouette.
No pneumothorax.
IMPRESSION: Significant interval worsening of left greater than right pulmonary
airspace disease consistent with pneumonia.

## 2021-04-02 ENCOUNTER — Other Ambulatory Visit (HOSPITAL_COMMUNITY): Payer: Self-pay

## 2021-04-03 ENCOUNTER — Other Ambulatory Visit (HOSPITAL_COMMUNITY): Payer: Self-pay
